# Patient Record
Sex: Female | Born: 1970 | Race: White | Hispanic: No | Marital: Married | Smoking: Former smoker
Health system: Southern US, Community
[De-identification: ages and names within clinical notes are randomized; demographics above are authoritative.]

## PROBLEM LIST (undated history)

## (undated) DIAGNOSIS — F419 Anxiety disorder, unspecified: Secondary | ICD-10-CM

## (undated) DIAGNOSIS — N809 Endometriosis, unspecified: Secondary | ICD-10-CM

## (undated) DIAGNOSIS — N200 Calculus of kidney: Secondary | ICD-10-CM

## (undated) DIAGNOSIS — G43909 Migraine, unspecified, not intractable, without status migrainosus: Secondary | ICD-10-CM

## (undated) DIAGNOSIS — Z87442 Personal history of urinary calculi: Secondary | ICD-10-CM

## (undated) DIAGNOSIS — F4310 Post-traumatic stress disorder, unspecified: Secondary | ICD-10-CM

## (undated) DIAGNOSIS — M797 Fibromyalgia: Secondary | ICD-10-CM

## (undated) DIAGNOSIS — F32A Depression, unspecified: Secondary | ICD-10-CM

## (undated) DIAGNOSIS — G8929 Other chronic pain: Secondary | ICD-10-CM

## (undated) DIAGNOSIS — R5382 Chronic fatigue, unspecified: Secondary | ICD-10-CM

## (undated) DIAGNOSIS — R55 Syncope and collapse: Secondary | ICD-10-CM

## (undated) DIAGNOSIS — K589 Irritable bowel syndrome without diarrhea: Secondary | ICD-10-CM

## (undated) HISTORY — PX: CYSTOSCOPY W/ URETEROSCOPY: SUR379

## (undated) HISTORY — PX: FOOT FRACTURE SURGERY: SHX645

---
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
## 1998-01-28 ENCOUNTER — Inpatient Hospital Stay (HOSPITAL_COMMUNITY): Admission: AD | Admit: 1998-01-28 | Discharge: 1998-01-28 | Payer: Self-pay | Admitting: Gynecology

## 1998-07-21 ENCOUNTER — Inpatient Hospital Stay (HOSPITAL_COMMUNITY): Admission: AD | Admit: 1998-07-21 | Discharge: 1998-07-21 | Payer: Self-pay | Admitting: Obstetrics and Gynecology

## 1998-08-11 ENCOUNTER — Inpatient Hospital Stay (HOSPITAL_COMMUNITY): Admission: AD | Admit: 1998-08-11 | Discharge: 1998-08-13 | Payer: Self-pay | Admitting: Obstetrics and Gynecology

## 1998-08-22 ENCOUNTER — Inpatient Hospital Stay (HOSPITAL_COMMUNITY): Admission: AD | Admit: 1998-08-22 | Discharge: 1998-08-25 | Payer: Self-pay | Admitting: Obstetrics and Gynecology

## 1998-10-25 ENCOUNTER — Inpatient Hospital Stay (HOSPITAL_COMMUNITY): Admission: AD | Admit: 1998-10-25 | Discharge: 1998-10-25 | Payer: Self-pay | Admitting: Obstetrics and Gynecology

## 1998-10-27 ENCOUNTER — Inpatient Hospital Stay (HOSPITAL_COMMUNITY): Admission: AD | Admit: 1998-10-27 | Discharge: 1998-10-28 | Payer: Self-pay | Admitting: Obstetrics and Gynecology

## 1998-10-29 ENCOUNTER — Inpatient Hospital Stay (HOSPITAL_COMMUNITY): Admission: AD | Admit: 1998-10-29 | Discharge: 1998-10-29 | Payer: Self-pay | Admitting: Obstetrics and Gynecology

## 1998-11-28 ENCOUNTER — Inpatient Hospital Stay (HOSPITAL_COMMUNITY): Admission: AD | Admit: 1998-11-28 | Discharge: 1998-11-28 | Payer: Self-pay | Admitting: Gynecology

## 1998-12-05 ENCOUNTER — Inpatient Hospital Stay (HOSPITAL_COMMUNITY): Admission: AD | Admit: 1998-12-05 | Discharge: 1998-12-05 | Payer: Self-pay | Admitting: Gynecology

## 1998-12-26 ENCOUNTER — Encounter: Admission: RE | Admit: 1998-12-26 | Discharge: 1999-03-26 | Payer: Self-pay | Admitting: Obstetrics and Gynecology

## 1999-01-05 ENCOUNTER — Inpatient Hospital Stay (HOSPITAL_COMMUNITY): Admission: AD | Admit: 1999-01-05 | Discharge: 1999-01-05 | Payer: Self-pay | Admitting: Obstetrics and Gynecology

## 1999-01-05 ENCOUNTER — Encounter: Payer: Self-pay | Admitting: Obstetrics and Gynecology

## 1999-01-10 ENCOUNTER — Inpatient Hospital Stay (HOSPITAL_COMMUNITY): Admission: AD | Admit: 1999-01-10 | Discharge: 1999-01-10 | Payer: Self-pay | Admitting: Obstetrics and Gynecology

## 1999-02-12 ENCOUNTER — Inpatient Hospital Stay (HOSPITAL_COMMUNITY): Admission: AD | Admit: 1999-02-12 | Discharge: 1999-02-12 | Payer: Self-pay | Admitting: Obstetrics and Gynecology

## 1999-02-14 ENCOUNTER — Observation Stay (HOSPITAL_COMMUNITY): Admission: AD | Admit: 1999-02-14 | Discharge: 1999-02-15 | Payer: Self-pay | Admitting: Obstetrics and Gynecology

## 1999-02-23 ENCOUNTER — Inpatient Hospital Stay (HOSPITAL_COMMUNITY): Admission: AD | Admit: 1999-02-23 | Discharge: 1999-02-26 | Payer: Self-pay | Admitting: Gynecology

## 1999-03-26 ENCOUNTER — Emergency Department (HOSPITAL_COMMUNITY): Admission: EM | Admit: 1999-03-26 | Discharge: 1999-03-27 | Payer: Self-pay

## 1999-03-27 ENCOUNTER — Emergency Department (HOSPITAL_COMMUNITY): Admission: EM | Admit: 1999-03-27 | Discharge: 1999-03-28 | Payer: Self-pay

## 1999-05-10 ENCOUNTER — Encounter: Payer: Self-pay | Admitting: Emergency Medicine

## 1999-05-10 ENCOUNTER — Emergency Department (HOSPITAL_COMMUNITY): Admission: EM | Admit: 1999-05-10 | Discharge: 1999-05-10 | Payer: Self-pay | Admitting: Emergency Medicine

## 1999-05-13 ENCOUNTER — Encounter: Payer: Self-pay | Admitting: Gastroenterology

## 1999-05-13 ENCOUNTER — Emergency Department (HOSPITAL_COMMUNITY): Admission: EM | Admit: 1999-05-13 | Discharge: 1999-05-13 | Payer: Self-pay | Admitting: Emergency Medicine

## 1999-07-22 ENCOUNTER — Encounter: Payer: Self-pay | Admitting: Gastroenterology

## 1999-07-22 ENCOUNTER — Ambulatory Visit (HOSPITAL_COMMUNITY): Admission: RE | Admit: 1999-07-22 | Discharge: 1999-07-22 | Payer: Self-pay | Admitting: Gastroenterology

## 1999-07-23 ENCOUNTER — Inpatient Hospital Stay (HOSPITAL_COMMUNITY): Admission: AD | Admit: 1999-07-23 | Discharge: 1999-07-25 | Payer: Self-pay | Admitting: General Surgery

## 1999-07-24 ENCOUNTER — Encounter: Payer: Self-pay | Admitting: General Surgery

## 1999-07-26 ENCOUNTER — Inpatient Hospital Stay (HOSPITAL_COMMUNITY): Admission: EM | Admit: 1999-07-26 | Discharge: 1999-07-28 | Payer: Self-pay | Admitting: Unknown Physician Specialty

## 1999-07-26 ENCOUNTER — Encounter: Payer: Self-pay | Admitting: Emergency Medicine

## 1999-07-26 ENCOUNTER — Encounter: Payer: Self-pay | Admitting: General Surgery

## 1999-08-06 ENCOUNTER — Encounter: Payer: Self-pay | Admitting: General Surgery

## 1999-08-06 ENCOUNTER — Ambulatory Visit (HOSPITAL_COMMUNITY): Admission: RE | Admit: 1999-08-06 | Discharge: 1999-08-06 | Payer: Self-pay | Admitting: General Surgery

## 1999-08-13 ENCOUNTER — Ambulatory Visit (HOSPITAL_COMMUNITY): Admission: RE | Admit: 1999-08-13 | Discharge: 1999-08-13 | Payer: Self-pay | Admitting: General Surgery

## 1999-08-13 ENCOUNTER — Encounter: Payer: Self-pay | Admitting: General Surgery

## 1999-09-07 ENCOUNTER — Encounter: Payer: Self-pay | Admitting: General Surgery

## 1999-09-07 ENCOUNTER — Inpatient Hospital Stay (HOSPITAL_COMMUNITY): Admission: EM | Admit: 1999-09-07 | Discharge: 1999-09-10 | Payer: Self-pay | Admitting: Emergency Medicine

## 1999-09-08 ENCOUNTER — Encounter: Payer: Self-pay | Admitting: General Surgery

## 1999-11-30 HISTORY — PX: CHOLECYSTECTOMY: SHX55

## 2000-03-25 ENCOUNTER — Emergency Department (HOSPITAL_COMMUNITY): Admission: EM | Admit: 2000-03-25 | Discharge: 2000-03-25 | Payer: Self-pay | Admitting: Emergency Medicine

## 2000-04-15 ENCOUNTER — Encounter: Payer: Self-pay | Admitting: Emergency Medicine

## 2000-04-15 ENCOUNTER — Emergency Department (HOSPITAL_COMMUNITY): Admission: EM | Admit: 2000-04-15 | Discharge: 2000-04-15 | Payer: Self-pay | Admitting: Emergency Medicine

## 2000-05-03 ENCOUNTER — Other Ambulatory Visit: Admission: RE | Admit: 2000-05-03 | Discharge: 2000-05-03 | Payer: Self-pay | Admitting: *Deleted

## 2000-11-08 ENCOUNTER — Emergency Department (HOSPITAL_COMMUNITY): Admission: EM | Admit: 2000-11-08 | Discharge: 2000-11-08 | Payer: Self-pay | Admitting: Emergency Medicine

## 2000-11-08 ENCOUNTER — Encounter: Payer: Self-pay | Admitting: Emergency Medicine

## 2001-05-22 ENCOUNTER — Encounter: Payer: Self-pay | Admitting: Emergency Medicine

## 2001-05-22 ENCOUNTER — Emergency Department (HOSPITAL_COMMUNITY): Admission: EM | Admit: 2001-05-22 | Discharge: 2001-05-22 | Payer: Self-pay | Admitting: Emergency Medicine

## 2001-06-28 ENCOUNTER — Other Ambulatory Visit: Admission: RE | Admit: 2001-06-28 | Discharge: 2001-06-28 | Payer: Self-pay | Admitting: Obstetrics and Gynecology

## 2007-10-14 ENCOUNTER — Emergency Department (HOSPITAL_COMMUNITY): Admission: EM | Admit: 2007-10-14 | Discharge: 2007-10-14 | Payer: Self-pay | Admitting: Emergency Medicine

## 2007-10-15 ENCOUNTER — Emergency Department (HOSPITAL_COMMUNITY): Admission: EM | Admit: 2007-10-15 | Discharge: 2007-10-15 | Payer: Self-pay | Admitting: Emergency Medicine

## 2007-10-16 ENCOUNTER — Ambulatory Visit (HOSPITAL_BASED_OUTPATIENT_CLINIC_OR_DEPARTMENT_OTHER): Admission: RE | Admit: 2007-10-16 | Discharge: 2007-10-17 | Payer: Self-pay | Admitting: Urology

## 2007-10-19 ENCOUNTER — Ambulatory Visit (HOSPITAL_BASED_OUTPATIENT_CLINIC_OR_DEPARTMENT_OTHER): Admission: RE | Admit: 2007-10-19 | Discharge: 2007-10-19 | Payer: Self-pay | Admitting: Urology

## 2009-12-04 ENCOUNTER — Emergency Department (HOSPITAL_COMMUNITY): Admission: EM | Admit: 2009-12-04 | Discharge: 2009-12-04 | Payer: Self-pay | Admitting: Family Medicine

## 2011-04-13 NOTE — Op Note (Signed)
Jordan Zimmerman, Jordan Zimmerman                  ACCOUNT NO.:  [id number]   MEDICAL RECORD NO.:  [id number]          PATIENT TYPE:  AMB   LOCATION:  [facility]                         FACILITY:  [facility]   PHYSICIAN:  Maretta Bees. Vonita Moss, M.D.DATE OF BIRTH:  11-02-1971   DATE OF PROCEDURE:  10/19/2007  DATE OF DISCHARGE:                               OPERATIVE REPORT   PREOPERATIVE DIAGNOSIS:  Left ureteral calculus and suspected  pyelonephritis.   POSTOPERATIVE DIAGNOSIS:  Left ureteral calculus and suspected  pyelonephritis.   PROCEDURE:  Cystoscopy, left ureteroscopy and stone basketing.   SURGEON:  Dr. Larey Dresser.   ANESTHESIA:  General.   INDICATIONS:  This 40 year old lady presented to me on October 16, 2007  with a 2-day history of left flank pain and no previous history of  stones and she had a 5 x 5 mm stone at the left UPJ and she had a fever.  She also had a history of fever.  She is brought to the operating room  earlier this week and specifically on that same day to put up a left  double-J catheter.  She is continuing to have some pain which I think is  stent related and she has had some fevers although urine culture turned  out to be growing multiple species.  She does get fever very easily and  has underlying fibromyalgia.  She is brought to the OR today for stone  removal in the hope that we could also remove her stent and get her more  comfortable.   PROCEDURE:  The patient brought to the operating room, placed lithotomy  position.  External genitalia were prepped and draped in usual fashion.  She was cystoscoped and the double-J catheters was noted coil in the  bladder.  I then used the grasper and pulled the double-J catheter just  outside the urethral meatus and threaded up the retractable cord  guidewire through the double-J catheter.  The stone was visualized in  the distal ureter.  I then used the inner core of the ureteral access  dilating sheath to dilate the intramural  ureter for two minutes.  With  the guidewire in place I then inserted a 6-French rigid ureteroscope and  easily visualized the stone.  I used a nitinol stone basket to remove  the stone intact.  The stone was given the patient's husband after the  case was over.  I then reinserted the ureteroscope and saw no evidence  of any significant ureteral injury whatsoever.  Therefore since she has  been on antibiotics several days and has good coverage, I opted to  remove her guidewire and leave out a double-J.  Bladder was emptied and  she was sent to recovery room in good condition.      Maretta Bees. Vonita Moss, M.D.  Electronically Signed     LJP/MEDQ  D:  10/19/2007  T:  10/19/2007  Job:  [id number]

## 2011-04-13 NOTE — Op Note (Signed)
[name], [name]                  ACCOUNT NO.:  [id number]   MEDICAL RECORD NO.:  [id number]          PATIENT TYPE:  AMB   LOCATION:  [facility]                         [facility]ACILITY:  [facility]   PHYSICIAN:  Maretta Bees. Vonita Moss, M.D.DATE OF BIRTH:  [birth year]   DATE OF PROCEDURE:  10/16/2007  DATE OF DISCHARGE:                               OPERATIVE REPORT   PREOPERATIVE DIAGNOSES:  1. Left ureteropelvic junction stone.  2. Urinary tract infection, with fever and possible pyelonephritis.   POSTOPERATIVE DIAGNOSES:  1. Distal left ureteral stone.  2. Urinary tract infection and fever, and possible left      pyelonephritis.   PROCEDURE:  Cystoscopy, left retrograde pyelogram with interpretation,  and left retrograde double-J catheter placement.   SURGEON:  Maretta Bees. Vonita Moss, M.D.   ANESTHESIA:  General.   INDICATIONS:  This lady has had a 2-day history of severe left flank  pain with nausea and vomiting, went to the ER, and was found to have a  left UPJ stone, and also was given a dose of Cipro because her urine  looked infected.  I saw her in the office today, and she is starting to  get febrile and has persistent symptoms of left flank pain, nausea and  vomiting, and felt best we go ahead with a cystoscopy and double-J  catheter placement.  She was told about the risks of bleeding,  obstruction, bladder irritability, etc.  She was given gentamicin and  Cipro intravenously preop, and urine cultures are pending.   PROCEDURE:  The patient was brought to the operating room and placed in  the lithotomy position.  External genitalia were prepped and draped in  the usual fashion.  She was cystoscoped, and the bladder was  unremarkable.  A retractable core guidewire was placed up the left  ureter, and it bypassed a stone in the mid-bony pelvis.  There was  consistent with the size stone seen on CT, measuring 8 x 5 x 5.   A left retrograde pyelogram was obtained by injecting contrast through  an  open-ended ureteral catheter placed cystoscopically over a guidewire.  She had mild pyelocaliectasis.  No filling defects were seen.   Guidewire was reinserted into the left kidney, and a 6-French 26 cm  double-J catheter was placed with a full coil in the upper left calix  and a full coil in the bladder.  The string was removed.  At this point,  the bladder was emptied, the cystoscope removed, and a B & O suppository  inserted per rectum, and she was taken to the recovery room in good  condition.      Maretta Bees. Vonita Moss, M.D.  Electronically Signed     LJP/MEDQ  D:  10/16/2007  T:  10/17/2007  Job:  [id number]

## 2011-09-07 LAB — URINALYSIS, ROUTINE W REFLEX MICROSCOPIC
Bilirubin Urine: NEGATIVE
Bilirubin Urine: NEGATIVE
Glucose, UA: 500 — AB
Glucose, UA: NEGATIVE
Ketones, ur: NEGATIVE
Leukocytes, UA: NEGATIVE
Nitrite: NEGATIVE
Nitrite: NEGATIVE
Protein, ur: 100 — AB
Protein, ur: NEGATIVE
Specific Gravity, Urine: 1.013
Specific Gravity, Urine: 1.027
Urobilinogen, UA: 0.2
Urobilinogen, UA: 0.2
pH: 5
pH: 7

## 2011-09-07 LAB — URINE MICROSCOPIC-ADD ON

## 2011-09-07 LAB — URINE CULTURE: Colony Count: >=100000

## 2011-09-07 LAB — POCT PREGNANCY, URINE
Operator id: 133231
Preg Test, Ur: NEGATIVE

## 2011-09-07 LAB — PREGNANCY, URINE: Preg Test, Ur: NEGATIVE

## 2011-09-07 LAB — BASIC METABOLIC PANEL
BUN: 12
CO2: 24
Calcium: 9.9
Chloride: 109
Creatinine, Ser: 0.81
GFR calc Af Amer: >60
GFR calc non Af Amer: >60
Glucose, Bld: 104 — ABNORMAL HIGH
Potassium: 3.8
Sodium: 142

## 2011-09-07 LAB — POCT HEMOGLOBIN-HEMACUE
Hemoglobin: 14
Operator id: 133231

## 2012-09-27 ENCOUNTER — Other Ambulatory Visit: Payer: Self-pay | Admitting: Obstetrics

## 2012-09-27 DIAGNOSIS — N644 Mastodynia: Secondary | ICD-10-CM

## 2012-10-04 ENCOUNTER — Other Ambulatory Visit: Payer: Self-pay | Admitting: Obstetrics

## 2012-10-04 ENCOUNTER — Ambulatory Visit
Admission: RE | Admit: 2012-10-04 | Discharge: 2012-10-04 | Disposition: A | Discharge status: Home / Self Care | Payer: BC Managed Care – PPO | Source: Ambulatory Visit | Attending: Obstetrics | Admitting: Obstetrics

## 2012-10-04 DIAGNOSIS — N644 Mastodynia: Secondary | ICD-10-CM

## 2013-04-04 ENCOUNTER — Emergency Department (HOSPITAL_COMMUNITY)
Admission: EM | Admit: 2013-04-04 | Discharge: 2013-04-04 | Disposition: A | Discharge status: Home / Self Care | Payer: Managed Care, Other (non HMO) | Attending: Emergency Medicine | Admitting: Emergency Medicine

## 2013-04-04 ENCOUNTER — Encounter (HOSPITAL_COMMUNITY): Payer: Self-pay | Admitting: Emergency Medicine

## 2013-04-04 DIAGNOSIS — Z8719 Personal history of other diseases of the digestive system: Secondary | ICD-10-CM | POA: Insufficient documentation

## 2013-04-04 DIAGNOSIS — R509 Fever, unspecified: Secondary | ICD-10-CM | POA: Insufficient documentation

## 2013-04-04 DIAGNOSIS — IMO0001 Reserved for inherently not codable concepts without codable children: Secondary | ICD-10-CM | POA: Insufficient documentation

## 2013-04-04 DIAGNOSIS — R112 Nausea with vomiting, unspecified: Secondary | ICD-10-CM

## 2013-04-04 DIAGNOSIS — Z87891 Personal history of nicotine dependence: Secondary | ICD-10-CM | POA: Insufficient documentation

## 2013-04-04 DIAGNOSIS — R5382 Chronic fatigue, unspecified: Secondary | ICD-10-CM | POA: Insufficient documentation

## 2013-04-04 DIAGNOSIS — G9332 Myalgic encephalomyelitis/chronic fatigue syndrome: Secondary | ICD-10-CM | POA: Insufficient documentation

## 2013-04-04 DIAGNOSIS — Z3202 Encounter for pregnancy test, result negative: Secondary | ICD-10-CM | POA: Insufficient documentation

## 2013-04-04 DIAGNOSIS — R197 Diarrhea, unspecified: Secondary | ICD-10-CM | POA: Insufficient documentation

## 2013-04-04 HISTORY — DX: Irritable bowel syndrome, unspecified: K58.9

## 2013-04-04 HISTORY — DX: Chronic fatigue, unspecified: R53.82

## 2013-04-04 HISTORY — DX: Fibromyalgia: M79.7

## 2013-04-04 LAB — URINALYSIS, ROUTINE W REFLEX MICROSCOPIC
Glucose, UA: NEGATIVE mg/dL
Ketones, ur: 40 mg/dL — AB
Leukocytes, UA: NEGATIVE
Nitrite: NEGATIVE
Protein, ur: 30 mg/dL — AB
Specific Gravity, Urine: 1.025 (ref 1.005–1.030)
Urobilinogen, UA: 0.2 mg/dL (ref 0.0–1.0)
pH: 5.5 (ref 5.0–8.0)

## 2013-04-04 LAB — COMPREHENSIVE METABOLIC PANEL
ALT: 18 U/L (ref 0–35)
AST: 34 U/L (ref 0–37)
Albumin: 4.2 g/dL (ref 3.5–5.2)
Alkaline Phosphatase: 94 U/L (ref 39–117)
BUN: 11 mg/dL (ref 6–23)
CO2: 24 mEq/L (ref 19–32)
Calcium: 10 mg/dL (ref 8.4–10.5)
Chloride: 102 mEq/L (ref 96–112)
Creatinine, Ser: 0.67 mg/dL (ref 0.50–1.10)
GFR calc Af Amer: >90 mL/min (ref >90)
GFR calc non Af Amer: >90 mL/min (ref >90)
Glucose, Bld: 117 mg/dL — ABNORMAL HIGH (ref 70–99)
Potassium: 3.2 mEq/L — ABNORMAL LOW (ref 3.5–5.1)
Sodium: 140 mEq/L (ref 135–145)
Total Bilirubin: 0.4 mg/dL (ref 0.3–1.2)
Total Protein: 7.8 g/dL (ref 6.0–8.3)

## 2013-04-04 LAB — CBC WITH DIFFERENTIAL/PLATELET
Basophils Absolute: 0 10*3/uL (ref 0.0–0.1)
Basophils Relative: 0 % (ref 0–1)
Eosinophils Absolute: 0 10*3/uL (ref 0.0–0.7)
Eosinophils Relative: 0 % (ref 0–5)
HCT: 40.4 % (ref 36.0–46.0)
Hemoglobin: 14.3 g/dL (ref 12.0–15.0)
Lymphocytes Relative: 25 % (ref 12–46)
Lymphs Abs: 1 10*3/uL (ref 0.7–4.0)
MCH: 31 pg (ref 26.0–34.0)
MCHC: 35.4 g/dL (ref 30.0–36.0)
MCV: 87.6 fL (ref 78.0–100.0)
Monocytes Absolute: 0.5 10*3/uL (ref 0.1–1.0)
Monocytes Relative: 12 % (ref 3–12)
Neutro Abs: 2.6 10*3/uL (ref 1.7–7.7)
Neutrophils Relative %: 63 % (ref 43–77)
Platelets: 188 10*3/uL (ref 150–400)
RBC: 4.61 MIL/uL (ref 3.87–5.11)
RDW: 12.6 % (ref 11.5–15.5)
WBC: 4.2 10*3/uL (ref 4.0–10.5)

## 2013-04-04 LAB — URINE MICROSCOPIC-ADD ON

## 2013-04-04 LAB — CK: Total CK: 62 U/L (ref 7–177)

## 2013-04-04 LAB — POCT PREGNANCY, URINE: Preg Test, Ur: NEGATIVE

## 2013-04-04 LAB — LIPASE, BLOOD: Lipase: 29 U/L (ref 11–59)

## 2013-04-04 MED ORDER — PROMETHAZINE HCL 25 MG/ML IJ SOLN
12.5000 mg | Freq: Once | INTRAMUSCULAR | Status: AC
  Administered 2013-04-04: 12.5 mg via INTRAVENOUS
  Filled 2013-04-04: qty 1

## 2013-04-04 MED ORDER — POTASSIUM CHLORIDE 10 MEQ/100ML IV SOLN
10.0000 meq | Freq: Once | INTRAVENOUS | Status: AC
  Administered 2013-04-04: 10 meq via INTRAVENOUS
  Filled 2013-04-04: qty 100

## 2013-04-04 MED ORDER — SODIUM CHLORIDE 0.9 % IV BOLUS (SEPSIS)
2000.0000 mL | Freq: Once | INTRAVENOUS | Status: AC
  Administered 2013-04-04: 2000 mL via INTRAVENOUS

## 2013-04-04 MED ORDER — PROMETHAZINE HCL 25 MG PO TABS: 25.0000 mg | ORAL_TABLET | Freq: Four times a day (QID) | ORAL | Status: DC | PRN

## 2013-04-04 MED ORDER — ONDANSETRON HCL 4 MG/2ML IJ SOLN
4.0000 mg | Freq: Once | INTRAMUSCULAR | Status: AC
  Administered 2013-04-04: 4 mg via INTRAVENOUS
  Filled 2013-04-04: qty 2

## 2013-04-04 NOTE — ED Notes (Signed)
Pt reports persistent  NVD and fever of 101 oral, x 4 days. Seen by PCP -Dr Tomi Bamberger. Given Zofran. Trated fever with Motrin. Referred to ED by PCP

## 2013-04-04 NOTE — ED Notes (Signed)
[employer] called to say that an IV pump was needed for patient.

## 2013-04-04 NOTE — ED Notes (Signed)
Patient had approx 50ml sprite and stated that it felt like it was sitting in her chest.

## 2013-04-04 NOTE — ED Provider Notes (Signed)
History     CSN: [id number]  Arrival date & time 04/04/13  1478   First MD Initiated Contact with Patient 04/04/13 1124      Chief Complaint  Patient presents with  . Nausea    2 day hx of NVD  . Emesis    Seen by PCP-given Zofran  . Fever  . Diarrhea    (Consider location/radiation/quality/duration/timing/severity/associated sxs/prior treatment) HPI Patient presents emergency department with nausea, vomiting, for the last 3 days.  Patient, states, that she has fibromyalgia and worked out in the yard excessively and this caused her problems with nausea, vomiting, she states this happens every time she over exerts herself.  Patient denies chest pain, abdominal pain, headache, blurred vision, dizziness, fever, syncope, back pain, dysuria, shortness of breath, weakness, or lethargy.  Patient, states, that she did not take any medications prior to arrival for her symptoms.  Patient, states she normally takes [place] powders for her fibromyalgia. Past Medical History  Diagnosis Date  . Fibromyalgia   . Chronic fatigue   . IBS (irritable bowel syndrome)     Past Surgical History  Procedure Laterality Date  . Cholecystectomy    . Tonsillectomy      Family History  Problem Relation Age of Onset  . Diabetes Mother   . Cancer Mother   . Hypertension Mother   . Hypertension Father   . Diabetes Father   . Cancer Father     History  Substance Use Topics  . Smoking status: Former Games developer  . Smokeless tobacco: Not on file  . Alcohol Use: Yes    OB History   Grav Para Term Preterm Abortions TAB SAB Ect Mult Living                  Review of Systems All other systems negative except as documented in the HPI. All pertinent positives and negatives as reviewed in the HPI. Allergies  Ambien; Amoxicillin; and Penicillins  Home Medications   Current Outpatient Rx  Name  Route  Sig  Dispense  Refill  . CRANBERRY PO   Oral   Take 2 capsules by mouth 2 (two) times daily.          Marland Kitchen ECHINACEA EXTRACT PO   Oral   Take 1 tablet by mouth 2 (two) times daily as needed (immune support.).         Marland Kitchen Ginkgo Biloba 40 MG TABS   Oral   Take 1 tablet by mouth 2 (two) times daily.         Marland Kitchen ibuprofen (ADVIL,MOTRIN) 200 MG tablet   Oral   Take 400 mg by mouth every 6 (six) hours as needed for pain.         Marland Kitchen ondansetron (ZOFRAN) 4 MG tablet   Oral   Take 4 mg by mouth every 8 (eight) hours as needed for nausea.         . ST JOHNS WORT PO   Oral   Take 1 capsule by mouth 3 (three) times daily.           BP 115/68  Pulse 85  Temp(Src) 98.4 F (36.9 C) (Oral)  Resp 16  Wt 173 lb 8 oz (78.699 kg)  SpO2 99%  LMP 03/21/2013  Physical Exam  Constitutional: She appears well-developed and well-nourished. No distress.  HENT:  Head: Normocephalic and atraumatic.  Mouth/Throat: Oropharynx is clear and moist.  Eyes: Pupils are equal, round, and reactive to light.  Neck: Normal  range of motion. Neck supple.  Cardiovascular: Normal rate, regular rhythm and normal heart sounds.  Exam reveals no gallop and no friction rub.   No murmur heard. Pulmonary/Chest: Effort normal and breath sounds normal.    ED Course  Procedures (including critical care time)  Labs Reviewed  COMPREHENSIVE METABOLIC PANEL - Abnormal; Notable for the following:    Potassium 3.2 (*)    Glucose, Bld 117 (*)    All other components within normal limits  CBC WITH DIFFERENTIAL  LIPASE, BLOOD  URINALYSIS, ROUTINE W REFLEX MICROSCOPIC  POCT PREGNANCY, URINE   patient states she was working outside in the heat and she states she exacerbated her fibromyalgia.  Patient, states, that she did not take any medications prior to arrival.  Patient has been given IV fluids and is feeling better here in the emergency department following an antiemetic.  Patient's vital signs have remained stable as well   MDM  MDM Reviewed: vitals, nursing note and previous chart Interpretation:  labs            Carlyle Dolly, PA-C 04/06/13 [phone]

## 2013-04-06 NOTE — ED Provider Notes (Signed)
Medical screening examination/treatment/procedure(s) were performed by non-physician practitioner and as supervising physician I was immediately available for consultation/collaboration.  Takota Cahalan R. Simpson Paulos, MD 04/06/13 0951 

## 2013-05-21 ENCOUNTER — Inpatient Hospital Stay (HOSPITAL_COMMUNITY)
Admission: EM | Admit: 2013-05-21 | Discharge: 2013-05-23 | DRG: 392 | Disposition: A | Discharge status: Home / Self Care | Payer: Managed Care, Other (non HMO) | Attending: Family Medicine | Admitting: Family Medicine

## 2013-05-21 ENCOUNTER — Encounter (HOSPITAL_COMMUNITY): Payer: Self-pay

## 2013-05-21 DIAGNOSIS — G9332 Myalgic encephalomyelitis/chronic fatigue syndrome: Secondary | ICD-10-CM | POA: Diagnosis present

## 2013-05-21 DIAGNOSIS — R5382 Chronic fatigue, unspecified: Secondary | ICD-10-CM | POA: Diagnosis present

## 2013-05-21 DIAGNOSIS — Z9089 Acquired absence of other organs: Secondary | ICD-10-CM

## 2013-05-21 DIAGNOSIS — M797 Fibromyalgia: Secondary | ICD-10-CM

## 2013-05-21 DIAGNOSIS — D72829 Elevated white blood cell count, unspecified: Secondary | ICD-10-CM

## 2013-05-21 DIAGNOSIS — IMO0001 Reserved for inherently not codable concepts without codable children: Secondary | ICD-10-CM | POA: Diagnosis present

## 2013-05-21 DIAGNOSIS — Z87891 Personal history of nicotine dependence: Secondary | ICD-10-CM

## 2013-05-21 DIAGNOSIS — R112 Nausea with vomiting, unspecified: Secondary | ICD-10-CM

## 2013-05-21 DIAGNOSIS — R1115 Cyclical vomiting syndrome unrelated to migraine: Principal | ICD-10-CM | POA: Diagnosis present

## 2013-05-21 DIAGNOSIS — E876 Hypokalemia: Secondary | ICD-10-CM | POA: Diagnosis present

## 2013-05-21 DIAGNOSIS — R197 Diarrhea, unspecified: Secondary | ICD-10-CM

## 2013-05-21 DIAGNOSIS — K589 Irritable bowel syndrome without diarrhea: Secondary | ICD-10-CM

## 2013-05-21 DIAGNOSIS — R319 Hematuria, unspecified: Secondary | ICD-10-CM | POA: Diagnosis present

## 2013-05-21 DIAGNOSIS — E860 Dehydration: Secondary | ICD-10-CM | POA: Diagnosis present

## 2013-05-21 HISTORY — DX: Calculus of kidney: N20.0

## 2013-05-21 HISTORY — DX: Migraine, unspecified, not intractable, without status migrainosus: G43.909

## 2013-05-21 LAB — CBC WITH DIFFERENTIAL/PLATELET
Basophils Absolute: 0 10*3/uL (ref 0.0–0.1)
Basophils Relative: 0 % (ref 0–1)
Eosinophils Absolute: 0 10*3/uL (ref 0.0–0.7)
Eosinophils Relative: 0 % (ref 0–5)
HCT: 41.8 % (ref 36.0–46.0)
Hemoglobin: 14.6 g/dL (ref 12.0–15.0)
Lymphocytes Relative: 10 % — ABNORMAL LOW (ref 12–46)
Lymphs Abs: 1.5 10*3/uL (ref 0.7–4.0)
MCH: 31.2 pg (ref 26.0–34.0)
MCHC: 34.9 g/dL (ref 30.0–36.0)
MCV: 89.3 fL (ref 78.0–100.0)
Monocytes Absolute: 0.8 10*3/uL (ref 0.1–1.0)
Monocytes Relative: 5 % (ref 3–12)
Neutro Abs: 13.7 10*3/uL — ABNORMAL HIGH (ref 1.7–7.7)
Neutrophils Relative %: 85 % — ABNORMAL HIGH (ref 43–77)
Platelets: 360 10*3/uL (ref 150–400)
RBC: 4.68 MIL/uL (ref 3.87–5.11)
RDW: 13.3 % (ref 11.5–15.5)
WBC: 16.1 10*3/uL — ABNORMAL HIGH (ref 4.0–10.5)

## 2013-05-21 LAB — RAPID URINE DRUG SCREEN, HOSP PERFORMED
Amphetamines: NOT DETECTED
Barbiturates: NOT DETECTED
Benzodiazepines: NOT DETECTED
Cocaine: NOT DETECTED
Opiates: POSITIVE — AB
Tetrahydrocannabinol: POSITIVE — AB

## 2013-05-21 LAB — COMPREHENSIVE METABOLIC PANEL
ALT: 8 U/L (ref 0–35)
AST: 21 U/L (ref 0–37)
Albumin: 4.7 g/dL (ref 3.5–5.2)
Alkaline Phosphatase: 92 U/L (ref 39–117)
BUN: 17 mg/dL (ref 6–23)
CO2: 22 mEq/L (ref 19–32)
Calcium: 9.8 mg/dL (ref 8.4–10.5)
Chloride: 105 mEq/L (ref 96–112)
Creatinine, Ser: 0.73 mg/dL (ref 0.50–1.10)
GFR calc Af Amer: >90 mL/min (ref >90)
GFR calc non Af Amer: >90 mL/min (ref >90)
Glucose, Bld: 150 mg/dL — ABNORMAL HIGH (ref 70–99)
Potassium: 3.3 mEq/L — ABNORMAL LOW (ref 3.5–5.1)
Sodium: 142 mEq/L (ref 135–145)
Total Bilirubin: 0.7 mg/dL (ref 0.3–1.2)
Total Protein: 7.9 g/dL (ref 6.0–8.3)

## 2013-05-21 LAB — URINALYSIS, ROUTINE W REFLEX MICROSCOPIC
Glucose, UA: NEGATIVE mg/dL
Ketones, ur: 15 mg/dL — AB
Leukocytes, UA: NEGATIVE
Nitrite: NEGATIVE
Protein, ur: 30 mg/dL — AB
Specific Gravity, Urine: 1.027 (ref 1.005–1.030)
Urobilinogen, UA: 0.2 mg/dL (ref 0.0–1.0)
pH: 5.5 (ref 5.0–8.0)

## 2013-05-21 LAB — PREGNANCY, URINE: Preg Test, Ur: NEGATIVE

## 2013-05-21 LAB — URINE MICROSCOPIC-ADD ON

## 2013-05-21 LAB — LIPASE, BLOOD: Lipase: 23 U/L (ref 11–59)

## 2013-05-21 MED ORDER — SODIUM CHLORIDE 0.9 % IV SOLN
INTRAVENOUS | Status: DC
  Administered 2013-05-21: 10:00:00 via INTRAVENOUS

## 2013-05-21 MED ORDER — ONDANSETRON HCL 4 MG/2ML IJ SOLN
4.0000 mg | Freq: Once | INTRAMUSCULAR | Status: AC
  Administered 2013-05-21: 4 mg via INTRAVENOUS

## 2013-05-21 MED ORDER — MORPHINE SULFATE 4 MG/ML IJ SOLN
4.0000 mg | Freq: Once | INTRAMUSCULAR | Status: AC
  Administered 2013-05-21: 4 mg via INTRAVENOUS
  Filled 2013-05-21: qty 1

## 2013-05-21 MED ORDER — ACETAMINOPHEN 325 MG PO TABS
650.0000 mg | ORAL_TABLET | Freq: Four times a day (QID) | ORAL | Status: DC | PRN
  Administered 2013-05-21 – 2013-05-22 (×3): 650 mg via ORAL
  Filled 2013-05-21 (×3): qty 2

## 2013-05-21 MED ORDER — SODIUM CHLORIDE 0.9 % IV SOLN
INTRAVENOUS | Status: DC
  Administered 2013-05-21: 12:00:00 via INTRAVENOUS

## 2013-05-21 MED ORDER — SODIUM CHLORIDE 0.9 % IV BOLUS (SEPSIS)
1000.0000 mL | Freq: Once | INTRAVENOUS | Status: AC
Start: 2013-05-21 — End: 2013-05-21
  Administered 2013-05-21: 1000 mL via INTRAVENOUS

## 2013-05-21 MED ORDER — ONDANSETRON HCL 4 MG/2ML IJ SOLN
INTRAMUSCULAR | Status: AC
  Filled 2013-05-21: qty 2

## 2013-05-21 MED ORDER — SODIUM CHLORIDE 0.9 % IV SOLN
INTRAVENOUS | Status: DC
  Administered 2013-05-21 – 2013-05-22 (×3): via INTRAVENOUS

## 2013-05-21 MED ORDER — ONDANSETRON HCL 4 MG/2ML IJ SOLN
4.0000 mg | Freq: Three times a day (TID) | INTRAMUSCULAR | Status: AC | PRN
  Administered 2013-05-21: 4 mg via INTRAVENOUS
  Filled 2013-05-21: qty 2

## 2013-05-21 MED ORDER — PROMETHAZINE HCL 25 MG/ML IJ SOLN
12.5000 mg | INTRAMUSCULAR | Status: AC
Start: 2013-05-21 — End: 2013-05-21
  Administered 2013-05-21: 12.5 mg via INTRAVENOUS
  Filled 2013-05-21: qty 1

## 2013-05-21 MED ORDER — METOCLOPRAMIDE HCL 5 MG/ML IJ SOLN
10.0000 mg | Freq: Once | INTRAMUSCULAR | Status: AC
  Administered 2013-05-21: 10 mg via INTRAVENOUS
  Filled 2013-05-21: qty 2

## 2013-05-21 MED ORDER — HYDROMORPHONE HCL PF 1 MG/ML IJ SOLN: 1.0000 mg | INTRAMUSCULAR | Status: DC | PRN

## 2013-05-21 MED ORDER — HYDROMORPHONE HCL PF 1 MG/ML IJ SOLN
1.0000 mg | Freq: Once | INTRAMUSCULAR | Status: AC
  Administered 2013-05-21: 1 mg via INTRAVENOUS
  Filled 2013-05-21: qty 1

## 2013-05-21 MED ORDER — POTASSIUM CHLORIDE CRYS ER 20 MEQ PO TBCR
20.0000 meq | EXTENDED_RELEASE_TABLET | Freq: Once | ORAL | Status: AC
  Administered 2013-05-21: 20 meq via ORAL
  Filled 2013-05-21: qty 1

## 2013-05-21 MED ORDER — ONDANSETRON HCL 4 MG/2ML IJ SOLN: 4.0000 mg | Freq: Once | INTRAMUSCULAR | Status: DC

## 2013-05-21 MED ORDER — PROMETHAZINE HCL 25 MG/ML IJ SOLN
12.5000 mg | INTRAMUSCULAR | Status: DC | PRN
  Administered 2013-05-21 – 2013-05-22 (×5): 25 mg via INTRAVENOUS
  Filled 2013-05-21 (×5): qty 1

## 2013-05-21 NOTE — ED Notes (Signed)
Pt. C/o N/V/D x2 days. Seen at [facility] for the same, no relief.

## 2013-05-21 NOTE — ED Notes (Signed)
Report called to 6N

## 2013-05-21 NOTE — H&P (Signed)
[facility] Attending Admission Note: Renold Don MD Personal pager:  [phone] FPTS Service Pager:  [phone]  I  have seen and examined this patient, reviewed their chart. I have discussed this patient with the resident. I agree with the resident's findings, assessment and care plan.  Additionally:  42 YO Female with hx/o IBS, fibromyalgia, and chronic fatigue who presents with 2 day history of increasing nausea and vomiting along with diarrhea.  States started Saturday night aroudn 10 PM.  Sharp epigastric pain at that time.  NB/NB vomitus.  Diarrhea started afterwards.  Difficulty holding down any fluids.  Came to ED this AM to be evaluated, unable to hold PO and therefore FPTS called for admission.  Did state that this AM had dark tarry stool.  Otherwise denies melena/hematochezia.  Exam: Gen:  Caucasian female, WDWN lying in bed, NAD HEENT:  Dry mucus membranes Heart:  RRR Abdomen:  TTP mild in epigastrum, other benign Ext:  No edema  Imp/Plan: 1. N/V/D - Had similar episode about 1 month ago which resolved with anti-emetics in ED.  - Continue anti-emetics and see if able to hold PO  2.  Proteinuria: - agree with repeat UA.  - No symptoms of UTI.  Culture pending.   Tobey Grim, MD 05/21/2013 9:43 PM

## 2013-05-21 NOTE — ED Notes (Signed)
Pt. C/o N/V/D x2 days.  States "I'm vomiting like every 15 minutes and now I feel like I have something caught in my throat. I think I might have ruptured something". Pt. Denies blood in vomit. Seen at [facility] for same.

## 2013-05-21 NOTE — H&P (Signed)
Family Medicine Teaching [facility] Admission History and Physical Service Pager: [phone]  Patient name: Jordan Zimmerman Medical record number: [id number] Date of birth: [birth year] Age: 42 y.o. Gender: female  Primary Care Provider: Tomi Bamberger, NP  Chief Complaint: Nausea and vomiting  Assessment and Plan: Jordan Zimmerman is a 42 y.o. year old female with PMH significant for IBS, fibromyalgia, and chronic fatigue syndrome presenting with a 2 day history of nausea and vomiting.  # Nausea/Vomiting: Patient reports non-bloody, non-bilious emesis every 15 to 30 minutes since Saturday PM. She has been unable to tolerate PO. She also reports constant throat pain that worsens and sharpens with swallowing as well as a 5-minute episode of severe sharp abdominal pain while in the ED that has since resolved; she has mild musculoskeletal soreness of the abdomen. She is feeling better after receiving IV Zofran. - Continue IVF until tolerating PO - Zofran 4mg  IV q8hr PRN  - Phenergan 12.5mg  IV q4hr PRN - Hydromorphone 1mg  q4hr PRN moderate to severe pain - Consider abdominal imaging if symptoms persist [ ]  F/u UDS [ ]  Order chloraseptic spray PRN throat pain  # Hematuria/Proteinuria: UA showed 15 ketones, 30 protein, large Hgb, but was negative for both leukocytes and nitrites. Patient says her LMP ended on 6/10 and denies any current bleeding. She does state that she is premenopausal and typically gets her period twice a month. She denies any change in her urinary baseline, including discoloration, dysuria, and frequency. Cr is normal at 0.73. - Consider repeat UA [ ]  F/u urine culture  # Leukocytosis: WBC elevated at 16.1 on admission with a neutrophil predominance of 85%. Patient has been afebrile and denies any fever in the past two weeks. She also denies any sick contacts. - Continue to monitor [ ]  Repeat CBC tomorrow AM  # Hypokalemia: K is 3.3 on admission. Likely secondary to PO  intolerance. Patient received K-DUR in the ED. - Continue to monitor; will consider additional repletion [ ]  Repeat BMET tomorrow AM  # IBS: Patient endorses some diarrhea, but states that she has this "on and off" at baseline. Her recent diarrhea has not significantly worsened or changed; there has been no blood.  - Continue to monitor  # Fibromyalgia: Stable, patient has no complaints. - Continue home regimen of herbal supplements  # Chronic fatigue syndrome: Stable, patient does not endorse increased fatigue.  FEN/GI: Clears, advance as tolerated; IVF NS at 145ml/hr Prophylaxis: SQ Heparin Disposition: Pending clinical improvement in tolerating PO, self-care at home Code Status: FULL, as discussed with patient on admission  History of Present Illness: Jordan Zimmerman is a 42 y.o. year old female  with PMH significant for IBS, fibromyalgia, and chronic fatigue syndrome presenting with a 2 day history of nausea and vomiting. Per the patient, she has mild nausea at baseline 2/2 her fibromyalgia. Her nausea worsened two nights ago and she began vomiting "every 15 to 30 minutes." She describes the emesis as "yellow or brownish," but denies any blood or bile. She tried chewing ginger to relieve her symptoms without success. Patient has been unable to tolerate PO intake. She now endorses some abdominal soreness as well as throat pain. She states that she "feels something" whenever she swallows and that swallowing increases the pain. She denies any fever, malaise, or sick contacts.  Of note, the patient reports a similar episode of prolonged nausea and vomiting in mid-May. She states that it was almost exactly the same, except that she reportedly  had a fever of 104-105F. She states that she was seen in an discharged from the ED at [facility] and then "went back and forth" to her family doctor for management. She says she tried oral anti-emetics as well as suppositories, but felt that neither helped  her symptoms. Her nausea and vomiting resolved in "about a week."  Over the course of the morning in the ED, she received fluids, Reglan x1, Phenergan x1, and Zofran x1. She also received morphine 4mg  x2 and dilaudid 1mg  x2 presumably for abdominal pain. Per the patient, she had a single episode of severe sharp pain in her epigastric region that made her abdomen "hard," but resolved in 5 minutes. Her symptoms had abated by the time she was interviewed for this H&P.  Patient Active Problem List   Diagnosis Date Noted  . Fibromyalgia 05/21/2013  . Irritable bowel syndrome 05/21/2013  . Dehydration 05/21/2013  . Nausea and vomiting 05/21/2013   Past Medical History: Past Medical History  Diagnosis Date  . Fibromyalgia   . Chronic fatigue   . IBS (irritable bowel syndrome)    Past Surgical History: Past Surgical History  Procedure Laterality Date  . Cholecystectomy    . Tonsillectomy     Social History: History  Substance Use Topics  . Smoking status: Former Games developer  . Smokeless tobacco: Not on file  . Alcohol Use: Yes  Patient lives at home with her husband and two children.  For any additional social history documentation, please refer to relevant sections of EMR.  Family History: Family History  Problem Relation Age of Onset  . Diabetes Mother   . Cancer Mother   . Hypertension Mother   . Hypertension Father   . Diabetes Father   . Cancer Father    Allergies: Allergies  Allergen Reactions  . Ambien (Zolpidem Tartrate) Anaphylaxis  . Amoxicillin Hives and Swelling    Fever.   . Penicillins Hives   No current facility-administered medications on file prior to encounter.   Current Outpatient Prescriptions on File Prior to Encounter  Medication Sig Dispense Refill  . CRANBERRY PO Take 2 capsules by mouth 2 (two) times daily.      Marland Kitchen ECHINACEA EXTRACT PO Take 1 tablet by mouth 2 (two) times daily as needed (immune support.).      Marland Kitchen Ginkgo Biloba 40 MG TABS Take 1  tablet by mouth 2 (two) times daily.      . ondansetron (ZOFRAN) 4 MG tablet Take 4 mg by mouth every 8 (eight) hours as needed for nausea.      . ST JOHNS WORT PO Take 1 capsule by mouth 3 (three) times daily.       Review Of Systems: Per HPI. Otherwise 12 point review of systems was performed and was unremarkable.  Physical Exam: BP 107/48  Pulse 61  Temp(Src) 99.2 F (37.3 C) (Oral)  Resp 16  Ht 5\' 5"  (1.651 m)  Wt 178 lb (80.74 kg)  BMI 29.62 kg/m2  SpO2 100% Exam: General: Laying comfortably in bed, no acute distress, mildly ill-appearing HEENT: PERRL, EOMI, mucous membranes are somewhat dry, unable to clearly visualize the back of the throat Cardiovascular: RRR, no murmur heard Respiratory: No increased work of breathing, CTAB Abdomen: Active bowel sounds, no organomegaly noted, mild discomfort with moderate palpation of upper epigastric area, otherwise no TTP Extremities: 2+ pulses bilaterally in UE and LE, no edema noted Skin: No rashes seen Neuro: Oriented to person, place, date, and situation  Labs  and Imaging: CBC BMET   Recent Labs Lab 05/21/13 0645  WBC 16.1*  HGB 14.6  HCT 41.8  PLT 360    Recent Labs Lab 05/21/13 0645  NA 142  K 3.3*  CL 105  CO2 22  BUN 17  CREATININE 0.73  GLUCOSE 150*  CALCIUM 9.8    6/23 - UA: Amber, turbid, 15 ketones, 30 protein, large Hgb, but was negative for both leukocytes and nitrites.    Mat Carne, MD 05/21/2013, 7:16 PM  Family Medicine Teaching Service Hospital Admission History and Physical  Patient name: Jordan Zimmerman Medical record number: [id number] Date of birth: [birth year] Age: 42 y.o. Gender: female  Primary Care Provider:  Tomi Bamberger, NP  Chief Complaint: Nausea and Vomiting  History of Present Illness: Jordan Zimmerman is a 42 y.o. year old female presenting with nausea and vomiting of two days duration. It started suddenly after eating chicken and rice on Saturday. This was not accompanied by  abdominal pain, fever, chills or diarrhea. She denies difficulty swallowing or pain with swallowing. Her son and husband at the same meal and have no symptoms. There are no known sick contacts. She attempted treatment with ondansetron prescribed by PCP, as well as ginger root, but neither was effective. No recent antibiotics or NSAIDS.   She notes a history of nausea, which has occurred daily since age 7. Usually, she is able to manage it by chewing ginger root. Occasionally, she will have difficulty eating for 1-2 days at a time, but she will be able to keep down liquids. She has not been able to tolerate liquids during this episode which is what brought her to the hospital. She had a similar case of nausea in May for which she went to [facility]. She does not know what caused that episode, but said it resolved after about 1 week. According to Epic the patient was evaluated for pancreatitis and pregnancy which were negative. No imaging was done. The patient is not sure if she has every seen a GI doctor. She denies any swallowing study, endoscopy, or CT scan for this problem in the past. She has a history of cholecystectomy in 2001 with need for percutaneous draining, but no abdominal procedures since that time.   Today in the ED, she was given normal saline, ondansetron, phenergan, morphine and dilaudid. Currently, she denies pain. She notes no symptoms at rest and has tolerated a few sips of liquids.     Patient Active Problem List   Diagnosis Date Noted  . Fibromyalgia 05/21/2013  . Irritable bowel syndrome 05/21/2013  . Dehydration 05/21/2013  . Nausea and vomiting 05/21/2013   Past Medical History: Past Medical History  Diagnosis Date  . Fibromyalgia   . Chronic fatigue   . IBS (irritable bowel syndrome)     Past Surgical History: Past Surgical History  Procedure Laterality Date  . Cholecystectomy    . Tonsillectomy      Social History: History   Social History  .  Marital Status: Married    Spouse Name: N/A    Number of Children: N/A  . Years of Education: N/A   Social History Main Topics  . Smoking status: Former Games developer  . Smokeless tobacco: None  . Alcohol Use: Yes  . Drug Use: None  . Sexually Active: None   Other Topics Concern  . None   Social History Narrative  . None    Family History: Family History  Problem Relation Age of Onset  .  Diabetes Mother   . Cancer Mother   . Hypertension Mother   . Hypertension Father   . Diabetes Father   . Cancer Father     Allergies: Allergies  Allergen Reactions  . Ambien (Zolpidem Tartrate) Anaphylaxis  . Amoxicillin Hives and Swelling    Fever.   . Penicillins Hives   No current facility-administered medications on file prior to encounter.   Current Outpatient Prescriptions on File Prior to Encounter  Medication Sig Dispense Refill  . CRANBERRY PO Take 2 capsules by mouth 2 (two) times daily.      Marland Kitchen ECHINACEA EXTRACT PO Take 1 tablet by mouth 2 (two) times daily as needed (immune support.).      Marland Kitchen Ginkgo Biloba 40 MG TABS Take 1 tablet by mouth 2 (two) times daily.      . ondansetron (ZOFRAN) 4 MG tablet Take 4 mg by mouth every 8 (eight) hours as needed for nausea.      . ST JOHNS WORT PO Take 1 capsule by mouth 3 (three) times daily.         Current Facility-Administered Medications  Medication Dose Route Frequency Provider Last Rate Last Dose  . 0.9 %  sodium chloride infusion   Intravenous Continuous Fayrene Helper, PA-C 125 mL/hr at 05/21/13 0921    . ondansetron (ZOFRAN) injection 4 mg  4 mg Intravenous Q8H PRN Fayrene Helper, PA-C   4 mg at 05/21/13 1819  . promethazine (PHENERGAN) injection 12.5-25 mg  12.5-25 mg Intravenous Q4H PRN Andrena Mews, DO   25 mg at 05/21/13 1513   Review Of Systems: Per HPI with the following additions: none Otherwise 12 point review of systems was performed and was unremarkable.  Physical Exam: Temp:  [98.1 F (36.7 C)-99.2 F (37.3 C)]  99.2 F (37.3 C) (06/23 1400) Pulse Rate:  [48-82] 61 (06/23 1400) Resp:  [14-18] 16 (06/23 1400) BP: (96-130)/(48-80) 107/48 mmHg (06/23 1400) SpO2:  [97 %-100 %] 100 % (06/23 1400) Weight:  [178 lb (80.74 kg)] 178 lb (80.74 kg) (06/23 1400)   General: alert, cooperative, appears stated age and no distress HEENT: PERRLA, extra ocular movement intact, sclera clear, anicteric and oropharynx clear, no lesions, oropharynx dry Heart: S1, S2 normal, no murmur, rub or gallop, regular rate and rhythm Lungs: clear to auscultation, no wheezes or rales and unlabored breathing Abdomen: abdomen is soft without significant tenderness, masses, organomegaly or guarding Extremities: extremities normal, atraumatic, no cyanosis or edema Skin:no rashes, no ecchymoses Neurology: normal without focal findings, mental status, speech normal, alert and oriented x3 and PERLA  Labs and Imaging:  See labs above No imaging     Assessment and Plan: Jordan Zimmerman is a 42 y.o. year old female presenting with nausea and vomiting and mild dehydration of unknown etiology.   # Nausea and Vomiting - Etiology is unclear. No evidence of acute abdomen on exam. History and picture not consistent with ileus or SBO. Not likely infectious since rest of her family is not sick. She has a history of similar symptoms and it's not clear what work-up has been undertaken. Since no dysphagia or odynophagia at this point, achalasia, stricture, or mass less likely. However, she may have some gastric dysmotility issue. No evidence of CNS problem on history or physical. Maybe cyclic vomiting syndrome.  - Cont alternating ondansetron and zofran - Consider reglan if not effective - Contact PCP office tomorrow to see about previous work up - No consultation or imaging warranted at this  point  # Dehydration - Due to nausea and vomiting. Less than 10% given stable vitals.  - Cont NS IV @ 100 ml/hr while awaiting improvement in PO tolerance    # Hypokalemia  - 3.3 upon arrival - F/u in AM  # Leukocytosis - 16.1 on admission, Unknown etiology as patient with no other objective signs of infection and no hx of sick ontact - Continue to trend  # Fibromyalgia - stable   FENGI - NS @ 100 ml/hr; liquid diet PPX - early ambulation DISPO - Admit to [facility] Teaching service given inability to tolerate PO; D/c pending tolerating PO  Si Raider. Clinton Sawyer, MD, MBA 05/21/2013, 7:16 PM Family Medicine Resident, PGY-2 [phone] pager

## 2013-05-21 NOTE — ED Notes (Signed)
Fayrene Helper, PA states pt can have ice chips and see if pt can tolerate PO fluids

## 2013-05-21 NOTE — ED Provider Notes (Signed)
History     CSN: [id number]  Arrival date & time 05/21/13  0606   First MD Initiated Contact with Patient 05/21/13 [phone]      Chief Complaint  Patient presents with  . Emesis    (Consider location/radiation/quality/duration/timing/severity/associated sxs/prior treatment) HPI  42 year old female with history of fibromyalgia, IBS, and chronic fatigue syndrome presents for evaluations of nausea vomiting and diarrhea. Patient reports for the past 2 days she has had gradual onset of persistent nausea, vomiting or than 20 bouts per day of non bloody, non bilious content.  She also endorsed to 3 bouts of nonbloody, non-mucous diarrhea per day. Eating or drinking worsen her symptoms. She is unable to keep any of her medication down. She denies fever, chills, headache, sneezing, cough, chest pain, shortness of breath, back pain, abdominal pain, dysuria, or rash. She denies dizziness or numbness. She relates her symptoms to her fibromyalgia which she has had for the past 10 years. She was last seen at [facility] for the same complaint 2 weeks ago. Patient does have a surgical history of cholecystectomy.  Past Medical History  Diagnosis Date  . Fibromyalgia   . Chronic fatigue   . IBS (irritable bowel syndrome)     Past Surgical History  Procedure Laterality Date  . Cholecystectomy    . Tonsillectomy      Family History  Problem Relation Age of Onset  . Diabetes Mother   . Cancer Mother   . Hypertension Mother   . Hypertension Father   . Diabetes Father   . Cancer Father     History  Substance Use Topics  . Smoking status: Former Games developer  . Smokeless tobacco: Not on file  . Alcohol Use: Yes    OB History   Grav Para Term Preterm Abortions TAB SAB Ect Mult Living                  Review of Systems  All other systems reviewed and are negative.    Allergies  Ambien; Amoxicillin; and Penicillins  Home Medications   Current Outpatient Rx  Name  Route  Sig  Dispense   Refill  . CRANBERRY PO   Oral   Take 2 capsules by mouth 2 (two) times daily.         Marland Kitchen ECHINACEA EXTRACT PO   Oral   Take 1 tablet by mouth 2 (two) times daily as needed (immune support.).         Marland Kitchen Ginkgo Biloba 40 MG TABS   Oral   Take 1 tablet by mouth 2 (two) times daily.         Marland Kitchen ibuprofen (ADVIL,MOTRIN) 200 MG tablet   Oral   Take 400 mg by mouth every 6 (six) hours as needed for pain.         Marland Kitchen ondansetron (ZOFRAN) 4 MG tablet   Oral   Take 4 mg by mouth every 8 (eight) hours as needed for nausea.         . promethazine (PHENERGAN) 25 MG tablet   Oral   Take 1 tablet (25 mg total) by mouth every 6 (six) hours as needed for nausea.   10 tablet   0   . ST JOHNS WORT PO   Oral   Take 1 capsule by mouth 3 (three) times daily.           BP 130/80  Temp(Src) 98.1 F (36.7 C) (Oral)  Resp 18  SpO2 98%  Physical Exam  Nursing note and vitals reviewed. Constitutional: She is oriented to person, place, and time. She appears well-developed and well-nourished. No distress.  Awake, alert, nontoxic appearance  HENT:  Head: Atraumatic.  Lips are dry, tongue is dry.  Eyes: Conjunctivae are normal. Right eye exhibits no discharge. Left eye exhibits no discharge.  Neck: Neck supple.  Cardiovascular: Normal rate and regular rhythm.   Pulmonary/Chest: Effort normal. No respiratory distress. She exhibits no tenderness.  Abdominal: Soft. There is tenderness (Mild epigastric tenderness on palpation without guarding or rebound tenderness. No McBurney's point, no Murphy sign, no hernia.). There is no rebound.  Musculoskeletal: She exhibits no tenderness.  ROM appears intact, no obvious focal weakness  Neurological: She is alert and oriented to person, place, and time.  Mental status and motor strength appears intact  Skin: No rash noted.  Psychiatric: She has a normal mood and affect.    ED Course  Procedures (including critical care time)  6:34 AM Patient  with history of IBS, fibromyalgia presents with nausea vomiting and diarrhea. She is afebrile, she has a nonsurgical abdomen. She appears to be dehydrated. Workup initiated, IV fluid given. Will continue to monitor.  7:50 AM Patient felt less nauseous after receiving Reglan. Appears more comfortable.  8:26 AM UA with evidence of hemoglobin on urine dipsticks. Patient is currently not on her menstrual period. She has no CVA tenderness and no urinary complaints concerning for kidney stones. Patient has hemoglobin urine dipsticks as compared to prior UA. She relates this to her dehydration. We'll continue with IV fluid. Patient requests for ice chips.  Will also give potassium supplementation as her potassium is 3.3 today.  Patient does have a elevated white count of 16.1. However her abdomen is unremarkable on exam and a urine shows no evidence of UTI. No complaints of chest pain cough or shortness of breath concerning for pneumonia. No skin changes consistent with cellulitis. Patient felt better, tolerates by mouth, and stable for discharge.    10:13 AM Pt continues to vomits, will continue with IVF and will give antiemetic medications.    10:44 AM Patient continues to endorse nausea states she's unable to tolerates PO and request for admission. Since patient has leukocytosis without a known source, and continued to endorse nausea will consult with hospitalist for further management.  12:03 PM i have consulted with [facility] Dr. Ashley Royalty, who agrees to see pt in ER.  Will admit for further care.    Labs Reviewed  CBC WITH DIFFERENTIAL - Abnormal; Notable for the following:    WBC 16.1 (*)    Neutrophils Relative % 85 (*)    Neutro Abs 13.7 (*)    Lymphocytes Relative 10 (*)    All other components within normal limits  COMPREHENSIVE METABOLIC PANEL - Abnormal; Notable for the following:    Potassium 3.3 (*)    Glucose, Bld 150 (*)    All other components within normal limits  URINALYSIS, ROUTINE  W REFLEX MICROSCOPIC - Abnormal; Notable for the following:    Color, Urine AMBER (*)    APPearance TURBID (*)    Hgb urine dipstick LARGE (*)    Bilirubin Urine SMALL (*)    Ketones, ur 15 (*)    Protein, ur 30 (*)    All other components within normal limits  LIPASE, BLOOD  PREGNANCY, URINE  URINE MICROSCOPIC-ADD ON   No results found.   1. Leukocytosis   2. Dehydration   3. Nausea vomiting and diarrhea  MDM  BP 105/56  Pulse 48  Temp(Src) 98.7 F (37.1 C) (Oral)  Resp 14  SpO2 99%  I have reviewed nursing notes and vital signs.  I reviewed available ER/hospitalization records thought the EMR          Fayrene Helper, [place] 05/21/13 1206

## 2013-05-22 DIAGNOSIS — IMO0001 Reserved for inherently not codable concepts without codable children: Secondary | ICD-10-CM

## 2013-05-22 DIAGNOSIS — K589 Irritable bowel syndrome without diarrhea: Secondary | ICD-10-CM

## 2013-05-22 DIAGNOSIS — R197 Diarrhea, unspecified: Secondary | ICD-10-CM

## 2013-05-22 LAB — CBC
HCT: 33.8 % — ABNORMAL LOW (ref 36.0–46.0)
HCT: 34.7 % — ABNORMAL LOW (ref 36.0–46.0)
Hemoglobin: 11.3 g/dL — ABNORMAL LOW (ref 12.0–15.0)
Hemoglobin: 11.8 g/dL — ABNORMAL LOW (ref 12.0–15.0)
MCH: 30.7 pg (ref 26.0–34.0)
MCH: 31.4 pg (ref 26.0–34.0)
MCHC: 33.4 g/dL (ref 30.0–36.0)
MCHC: 34 g/dL (ref 30.0–36.0)
MCV: 91.8 fL (ref 78.0–100.0)
MCV: 92.3 fL (ref 78.0–100.0)
Platelets: 194 10*3/uL (ref 150–400)
RBC: 3.68 MIL/uL — ABNORMAL LOW (ref 3.87–5.11)
RBC: 3.76 MIL/uL — ABNORMAL LOW (ref 3.87–5.11)
RDW: 13.4 % (ref 11.5–15.5)
RDW: 13.3 % (ref 11.5–15.5)
WBC: 6.9 10*3/uL (ref 4.0–10.5)
WBC: 6.4 10*3/uL (ref 4.0–10.5)

## 2013-05-22 LAB — BASIC METABOLIC PANEL
BUN: 11 mg/dL (ref 6–23)
CO2: 25 mEq/L (ref 19–32)
Calcium: 8.3 mg/dL — ABNORMAL LOW (ref 8.4–10.5)
Chloride: 108 mEq/L (ref 96–112)
Creatinine, Ser: 0.79 mg/dL (ref 0.50–1.10)
GFR calc Af Amer: >90 mL/min (ref >90)
GFR calc non Af Amer: >90 mL/min (ref >90)
Glucose, Bld: 102 mg/dL — ABNORMAL HIGH (ref 70–99)
Potassium: 2.9 mEq/L — ABNORMAL LOW (ref 3.5–5.1)
Sodium: 141 mEq/L (ref 135–145)

## 2013-05-22 LAB — URINALYSIS, ROUTINE W REFLEX MICROSCOPIC
Bilirubin Urine: NEGATIVE
Glucose, UA: NEGATIVE mg/dL
Hgb urine dipstick: NEGATIVE
Ketones, ur: NEGATIVE mg/dL
Leukocytes, UA: NEGATIVE
Nitrite: NEGATIVE
Protein, ur: NEGATIVE mg/dL
Specific Gravity, Urine: 1.005 (ref 1.005–1.030)
Urobilinogen, UA: 1 mg/dL (ref 0.0–1.0)
pH: 7 (ref 5.0–8.0)

## 2013-05-22 LAB — HELICOBACTER PYLORI ABS-IGG+IGA, BLD
H Pylori IgA: 3.2 U/mL (ref <9.0)
H Pylori IgG: <0.4 {ISR}

## 2013-05-22 LAB — LIPASE, BLOOD: Lipase: 41 U/L (ref 11–59)

## 2013-05-22 MED ORDER — ONDANSETRON HCL 4 MG/2ML IJ SOLN
4.0000 mg | Freq: Four times a day (QID) | INTRAMUSCULAR | Status: DC
  Administered 2013-05-22 – 2013-05-23 (×4): 4 mg via INTRAVENOUS
  Filled 2013-05-22 (×4): qty 2

## 2013-05-22 MED ORDER — SODIUM CHLORIDE 0.45 % IV SOLN
INTRAVENOUS | Status: DC
  Administered 2013-05-22 – 2013-05-23 (×2): via INTRAVENOUS
  Filled 2013-05-22 (×6): qty 1000

## 2013-05-22 MED ORDER — POTASSIUM CHLORIDE 10 MEQ/100ML IV SOLN
10.0000 meq | INTRAVENOUS | Status: AC
  Administered 2013-05-22: 10 meq via INTRAVENOUS
  Filled 2013-05-22: qty 100

## 2013-05-22 MED ORDER — POTASSIUM CHLORIDE 10 MEQ/100ML IV SOLN
10.0000 meq | INTRAVENOUS | Status: AC
  Administered 2013-05-22 – 2013-05-23 (×3): 10 meq via INTRAVENOUS
  Filled 2013-05-22 (×3): qty 100

## 2013-05-22 NOTE — Progress Notes (Signed)
Spoke with patient's PCP office [facility]) over the phone in regards to her similar N/V episode in May. Per, PCP office patient was seen on May 6th with nausea and vomiting. She was afebrile and had a normal abdominal exam. It was suspected to be a stomach virus. The patient declined to have labs drawn at the time. She was given a 25mg  injection of Phenergan and told to rest and increase fluid intake. She was instructed to return if her symptoms did not improve and was not seen again.   In light of her UDS results, asked about patient's social history regarding marijuana use and was told that she had reported no history or current use of illicit substances.

## 2013-05-22 NOTE — Progress Notes (Signed)
Reviewed and agree.

## 2013-05-22 NOTE — Progress Notes (Signed)
Utilization review completed. Talli Kimmer, RN, BSN. 

## 2013-05-22 NOTE — ED Provider Notes (Signed)
Medical screening examination/treatment/procedure(s) were conducted as a shared visit with non-physician practitioner(s) and myself.  I personally evaluated the patient during the encounter   Gerhard Munch, MD 05/22/13 [phone]

## 2013-05-22 NOTE — Progress Notes (Addendum)
Family Medicine Teaching Service Daily Progress Note Intern Pager: [phone]  Patient name: Jordan Zimmerman Medical record number: [id number] Date of birth: 10-Dec-1970 Age: 42 y.o. Gender: female  Primary Care Provider: Tomi Bamberger, NP  Subjective: Patient states that she is nauseated this morning. She says she has had waves of nausea overnight, but has had no episodes of vomiting which she is pleased with. She was able to drink two small cans of soda without emesis. She reports soreness of her upper epigastric region, but denies any pain beyond her baseline fibromyalgia. Her last BM was yesterday morning and was a baseline, non-bloody stool.  Objective: Temp:  [98.2 F (36.8 C)-99.2 F (37.3 C)] 98.2 F (36.8 C) (06/24 0550) Pulse Rate:  [48-69] 60 (06/24 0550) Resp:  [14-16] 16 (06/24 0550) BP: (96-107)/(48-62) 104/62 mmHg (06/24 0550) SpO2:  [97 %-100 %] 97 % (06/24 0550) Weight:  [178 lb (80.74 kg)-185 lb 4.8 oz (84.052 kg)] 185 lb 4.8 oz (84.052 kg) (06/24 0550) Exam: General: Reclining in bed, no acute distress, mildly uncomfortable Cardiovascular: RRR, no murmur heard Respiratory: CTAB, no increased work of breathing Abdomen: Soft, mild TTP of the epigastric region, otherwise no TTP, soft bowel sounds Extremities: No edema  Laboratory:  Recent Labs Lab 05/21/13 0645 05/22/13 0415  WBC 16.1* 6.9  HGB 14.6 11.3*  HCT 41.8 33.8*  PLT 360 DELTA CHECK NOTED    Recent Labs Lab 05/21/13 0645 05/22/13 0415  NA 142 141  K 3.3* 2.9*  CL 105 108  CO2 22 25  BUN 17 11  CREATININE 0.73 0.79  CALCIUM 9.8 8.3*  PROT 7.9  --   BILITOT 0.7  --   ALKPHOS 92  --   ALT 8  --   AST 21  --   GLUCOSE 150* 102*    Imaging/Diagnostic Tests: 05/22/13 - Repeat UA: Pending collection >>> 05/22/13 - UCx: Ordered >>>  Assessment and Plan: Jordan Zimmerman is a 42 y.o. year old female with PMH significant for IBS, fibromyalgia, and chronic fatigue syndrome presenting with a 2 day  history of nausea and vomiting.   # Nausea/Vomiting: Patient reports non-bloody, non-bilious emesis every 15 to 30 minutes since Saturday PM. She continues to have intermittent nausea, but has been able to tolerate drinking limited liquids and has not had any additional vomiting. She endorses some abdominal tenderness, but denies other pain.  Her UDS was positive for opiates (s/p administration of opiates in the ED) and THC. Patient denies smoking marijuana, but admits that she is "around it" once or twice a month as her brother smokes. - Switch to 1/2 NS + KCL IVF  - Scheduled Zofran 4mg  IV q6hr   - Phenergan 12.5mg  IV q4hr PRN  - Hydromorphone 1mg  q4hr PRN moderate to severe pain  - Consider abdominal imaging if symptoms persist  - Continue to monitor PO tolerance [ ]  Contact PCP for information of prior N/V episode in May  # Hematuria/Proteinuria: UA showed 15 ketones, 30 protein, large Hgb, but was negative for both leukocytes and nitrites. No UCx was ordered. Patient says her LMP ended on 6/10 and denies any current bleeding. She does state that she is premenopausal and typically gets her period twice a month. She denies any change in her urinary baseline, including discoloration, dysuria, and frequency. Cr was normal at 0.73 on admission.  - Ordered repeat UA and UCx [ ]  F/u urine studies   # Leukocytosis: WBC elevated at 16.1 on admission with a  neutrophil predominance of 85%. Patient has been afebrile and denies any fever in the past two weeks. She also denies any sick contacts. WBC has fallen to 6.4 this AM. Patient's Hgb/Hct has also fallen from 14.6/41.8 on admission to 11.3/33.8 and 11.8/34.7 on repeat this morning. Potentially hemo-dilutional change, thought patient had a normal Hgb/Hct of 14.3/40.4 on 04/04/13. - Continue to monitor  [ ]  Repeat CBC tomorrow AM   # Hypokalemia: K is 3.3 on admission. Likely secondary to PO intolerance. Patient received K-DUR in the ED. K  this morning is 2.9. 4 runs of IV KCl ordered and KCl added to IVF. - Continue to monitor [ ]  Repeat BMET tomorrow AM   # IBS: Patient endorses some diarrhea, but states that she has this "on and off" at baseline. Her recent diarrhea has not significantly worsened or changed; there has been no blood.  - Continue to monitor   # Fibromyalgia: Stable, patient has no complaints.  - Continue home regimen of herbal supplements   # Chronic fatigue syndrome: Stable, patient does not endorse increased fatigue.   FEN/GI: Clears, advance as tolerated; IVF1/2 NS at 134ml/hr  Prophylaxis: SQ Heparin  Disposition: Pending clinical improvement in tolerating PO, self-care at home  Code Status: FULL, as discussed with patient on admission   Jordan Zimmerman, Med Student 05/22/2013, 9:17 AM PGY-1, [facility] Family Medicine FPTS Intern pager: [phone]  PGY-2 Addendum S: Improved this morning.  Still nauseated.  No further vomiting or diarrhea.  Slow going with fluids this morning.  Some upper abdominal soreness, that she states feels like it is in the muscle.  O: BP 109/67  Pulse 54  Temp(Src) 98.3 F (36.8 C) (Oral)  Resp 17  Ht 5\' 5"  (1.651 m)  Wt 185 lb 4.8 oz (84.052 kg)  BMI 30.84 kg/m2  SpO2 99%  LMP 05/01/2013 Gen: alert, cooperative, NAD HEENT: AT/[place], sclera white, MMM CV: RRR, no murmurs Abd: +BS, soft, ND, mildly tenderness across upper abdomen Ext: no edema  A/P: see above note for complete A/P.  Briefly, 42 yo F with fibromyalgia, IBS, chronic nausea, here with 2 days of n/v/d.  # N/V/D: Improved.  Possibly cyclic vomiting associated with marijuana exposure.  - will schedule zofran today - phenergan prn - possibly change to PO antiemetics if does better with lunch - discussed avoidance of marijuana to help prevent future flares - no clues on history to guide need for additional work up  # Hypokalemia: 2.9 this morning - change fluids to 1/2NS + 40KCl - KCl runs  x4  # Dispo: pending tolerating PO, anticipate in next 24-48 hours  Jordan Zimmerman, Jordan Zimmerman 05/22/2013, 9:57 AM

## 2013-05-22 NOTE — Progress Notes (Signed)
FMTS Attending Admission Note: Renold Don MD Personal pager:  [phone] FPTS Service Pager:  [phone]  I  have seen and examined this patient, reviewed their chart. I have discussed this patient with the resident. I agree with the resident's findings, assessment and care plan.  Additionally: - Patient improving slowly.  Still with epigastric tenderness but otherwise benign abdomen. - Still with significant nausea.  No overt vomiting.  Not hungry. - Taking fair po fluids but not enough for maintenance yet. - Cont anti-emetics.  Increase diet once tolerated.  - Adding lipase to BMET from today -- not likely pancreatitis, but with epigastric tenderness and history of THC use.  Denies ETOH usage.    Tobey Grim, MD 05/22/2013 2:36 PM

## 2013-05-23 LAB — BASIC METABOLIC PANEL
BUN: 5 mg/dL — ABNORMAL LOW (ref 6–23)
CO2: 21 mEq/L (ref 19–32)
Calcium: 9 mg/dL (ref 8.4–10.5)
Chloride: 109 mEq/L (ref 96–112)
Creatinine, Ser: 0.75 mg/dL (ref 0.50–1.10)
GFR calc Af Amer: >90 mL/min (ref >90)
GFR calc non Af Amer: >90 mL/min (ref >90)
Glucose, Bld: 104 mg/dL — ABNORMAL HIGH (ref 70–99)
Potassium: 3.9 mEq/L (ref 3.5–5.1)
Sodium: 142 mEq/L (ref 135–145)

## 2013-05-23 LAB — CBC
HCT: 37 % (ref 36.0–46.0)
Hemoglobin: 12.8 g/dL (ref 12.0–15.0)
MCH: 31 pg (ref 26.0–34.0)
MCHC: 34.6 g/dL (ref 30.0–36.0)
MCV: 89.6 fL (ref 78.0–100.0)
Platelets: 229 10*3/uL (ref 150–400)
RBC: 4.13 MIL/uL (ref 3.87–5.11)
RDW: 12.9 % (ref 11.5–15.5)
WBC: 7.9 10*3/uL (ref 4.0–10.5)

## 2013-05-23 MED ORDER — HEPARIN SODIUM (PORCINE) 5000 UNIT/ML IJ SOLN
5000.0000 [IU] | Freq: Three times a day (TID) | INTRAMUSCULAR | Status: DC
  Administered 2013-05-23: 5000 [IU] via SUBCUTANEOUS
  Filled 2013-05-23: qty 1

## 2013-05-23 MED ORDER — ONDANSETRON 4 MG PO TBDP
4.0000 mg | ORAL_TABLET | Freq: Three times a day (TID) | ORAL | Status: DC
  Administered 2013-05-23: 4 mg via ORAL
  Filled 2013-05-23 (×5): qty 1

## 2013-05-23 MED ORDER — ONDANSETRON HCL 4 MG PO TABS: 4.0000 mg | ORAL_TABLET | Freq: Three times a day (TID) | ORAL | Status: DC | PRN

## 2013-05-23 NOTE — Progress Notes (Signed)
FMTS Attending Daily Note:  Renold Don MD  [phone] pager  Family Practice pager:  [phone] I have discussed this patient with the resident Dr. Elwyn Reach.  I agree with their findings, assessment, and care plan

## 2013-05-23 NOTE — Progress Notes (Signed)
Family Medicine Teaching Service Daily Progress Note Intern Pager: [phone]  Patient name: Jordan Zimmerman Medical record number: [id number] Date of birth: January 06, 1971 Age: 42 y.o. Gender: female  Primary Care Provider: Tomi Bamberger, NP  Subjective: Patient is doing well today. She states she has been able to tolerate a liquid diet without emesis. She endorses hunger and would like to try solid food today. She denies having any abdominal or throat pain. She mentions that overnight she had recurrent bouts of non-bloody, no mucous diarrhea. She says this is identical to other IBS flares she has had in the past and feels as though it is resolving.  Objective: Temp:  [98.2 F (36.8 C)-98.6 F (37 C)] 98.2 F (36.8 C) (06/25 0614) Pulse Rate:  [54-93] 93 (06/25 0614) Resp:  [15-18] 18 (06/25 0614) BP: (96-110)/(56-69) 96/56 mmHg (06/25 0614) SpO2:  [96 %-100 %] 99 % (06/25 0614) Weight:  [80 lb 6.4 oz (36.469 kg)] 80 lb 6.4 oz (36.469 kg) (06/25 1478) Exam: General: Well-appearing, sitting up in bed, eating soup Cardiovascular: RRR, no murmur Respiratory: CTAB, no increased work of breathing, good air movement Abdomen: Soft, no TTP, active bowel sounds Extremities: No edema  Laboratory:  Recent Labs Lab 05/22/13 0415 05/22/13 0818 05/23/13 0845  WBC 6.9 6.4 7.9  HGB 11.3* 11.8* 12.8  HCT 33.8* 34.7* 37.0  PLT DELTA CHECK NOTED 194 229    Recent Labs Lab 05/21/13 0645 05/22/13 0415 05/23/13 0557  NA 142 141 142  K 3.3* 2.9* 3.9  CL 105 108 109  CO2 22 25 21   BUN 17 11 5*  CREATININE 0.73 0.79 0.75  CALCIUM 9.8 8.3* 9.0  PROT 7.9  --   --   BILITOT 0.7  --   --   ALKPHOS 92  --   --   ALT 8  --   --   AST 21  --   --   GLUCOSE 150* 102* 104*     Assessment and Plan: Jordan Zimmerman is a 42 y.o. year old female with PMH significant for IBS, fibromyalgia, and chronic fatigue syndrome presenting with a 2 day history of nausea and vomiting.   # Nausea/Vomiting: Patient  reports non-bloody, non-bilious emesis every 15 to 30 minutes since Saturday PM. She continues to have intermittent nausea, but has been able to tolerate drinking limited liquids and has not had any additional vomiting. She endorses some abdominal tenderness, but denies other pain. Her UDS was positive for opiates (s/p administration of opiates in the ED) and THC. Patient denies smoking marijuana, but admits that she is "around it" once or twice a month as her brother smokes. She has not had any vomiting x48 hours and her nausea has subsided. PCP was contacted on 6/24 regarding her N/V in May (see note) and felt it to be a stomach virus. - Continued 1/2 NS + KCL IVF  - Transitioned patient to scheduled Zofran 4mg  oral q6hr  - Phenergan 12.5mg  IV q4hr PRN  - Hydromorphone 1mg  q4hr PRN moderate to severe pain  - Continue to monitor PO tolerance, likely discharge if patient tolerates PO meds and food - Patient advised to avoid marijuana exposure as this may be a trigger for her vomiting  # Hematuria/Proteinuria: UA showed 15 ketones, 30 protein, large Hgb, but was negative for both leukocytes and nitrites. No UCx was ordered. Patient says her LMP ended on 6/10 and denies any current bleeding. She does state that she is premenopausal and typically gets  her period twice a month. She denies any change in her urinary baseline, including discoloration, dysuria, and frequency. Cr was normal at 0.73 on admission.  - Repeat UA was normal [ ]  F/u urine culture  # Leukocytosis: WBC elevated at 16.1 on admission with a neutrophil predominance of 85%. Patient has been afebrile and denies any fever in the past two weeks. She also denies any sick contacts. WBC has fallen to 6.4 this AM. Patient's Hgb/Hct has also fallen from 14.6/41.8 on admission to 11.3/33.8 and 11.8/34.7 on repeat this morning. Potentially hemo-dilutional change, though patient had a normal Hgb/Hct of 14.3/40.4 on 04/04/13. On 6/25, patient has a  normal CBC (see labs above).  # Hypokalemia: K is 3.3 on admission. Likely secondary to PO intolerance. Patient received K-DUR in the ED. K on 6/24 is 2.9. 4 runs of IV KCl ordered and KCl added to IVF. K on 6/25 has improved to 3.9.  # IBS: Patient endorses some diarrhea, but states that she has this "on and off" at baseline. She had some diarrhea overnight. She denies any blood in the stool.  She feels this is the result of an IBS flare. She is afebrile, does not have an elevated white count, and has a benign abdominal exam. - Continue to monitor   # Fibromyalgia: Stable, patient has no complaints.  - Continue home regimen of herbal supplements   # Chronic fatigue syndrome: Stable, patient does not endorse increased fatigue.   FEN/GI: Clears, advance as tolerated; IVF1/2 NS at 158ml/hr  Prophylaxis: SQ Heparin  Disposition: Pending clinical improvement in tolerating PO, self-care at home  Code Status: FULL, as discussed with patient on admission   Jordan Zimmerman, Med Student 05/23/2013, 9:16 AM PGY-1, [facility] Family Medicine FPTS Intern pager: [phone]  PGY-2 Addendum S: Doing well this morning.  Denies nausea, vomiting and abdominal pain.  Wants to eat real food.  Did have watery diarrhea overnight, reports typical of her IBS flares, no blood, starting to improve already.  O: BP 96/56  Pulse 93  Temp(Src) 98.2 F (36.8 C) (Oral)  Resp 18  Ht 5\' 5"  (1.651 m)  Wt 80 lb 6.4 oz (36.469 kg)  BMI 13.38 kg/m2  SpO2 99%  LMP 05/01/2013 Gen: alert, cooperative, NAD HEENT: MMM Abd: +BS, soft, NTND  A/P: Agree with above note, briefly... 42 yo F with fibromyalgia, IBS, chronic nausea admitted with intractable nausea and vomiting.  # N/V: Much improved.  Likely cyclic vomiting, possibly related to marijuana exposure. - transition to PO zofran today - will KVO fluids - advance diet as tolerated - d/c home if tolerates lunch  # Diarrhea: Likely IBS flare.  No risk  factors for c diff.  Abdominal exam benign. - continue to monitor  # Abnormal urine: Repeat UA completely normal.  - f/u urine culture  # Dispo: anticipate this afternoon if tolerates lunch  BOOTH, ERIN 05/23/2013, 9:46 AM

## 2013-05-23 NOTE — Discharge Summary (Signed)
Pt being d/ced home, awaiting ride home from her husband

## 2013-05-23 NOTE — Discharge Summary (Signed)
Family Medicine Teaching [facility] Discharge Summary  Patient name: Jordan Zimmerman Medical record number: [id number] Date of birth: 08/04/1971 Age: 42 y.o. Gender: female Date of Admission: 05/21/2013  Date of Discharge: 05/23/2013 Admitting Physician: Everrett Coombe, DO  Primary Care Provider: Tomi Bamberger, NP  Indication for Hospitalization: Nausea and vomiting Discharge Diagnoses:  Nausea and vomiting - resolved Hypokalemia - resolved Hematuria/proteinuria - resolved History of IBS  History of fibromyalgia History of chronic fatigue syndrome  Consultations: None  Significant Labs and Imaging:   Recent Labs Lab 05/21/13 0645 05/22/13 0415 05/22/13 0818 05/23/13 0845  WBC 16.1* 6.9 6.4 7.9  HGB 14.6 11.3* 11.8* 12.8  HCT 41.8 33.8* 34.7* 37.0  PLT 360 DELTA CHECK NOTED 194 229    Recent Labs Lab 05/21/13 0645 05/22/13 0415 05/23/13 0557  NA 142 141 142  K 3.3* 2.9* 3.9  CL 105 108 109  CO2 22 25 21   GLUCOSE 150* 102* 104*  BUN 17 11 5*  CREATININE 0.73 0.79 0.75  CALCIUM 9.8 8.3* 9.0  ALKPHOS 92  --   --   AST 21  --   --   ALT 8  --   --   ALBUMIN 4.7  --   --    05/21/13 - UA: Amber, turbid, 15 ketones, 30 protein, large Hgb; negative leukocytes and nitrites 05/22/13 - Repeat UA: Normal 05/22/13 - UCx: Pending >>>   Procedures: None  Brief Hospital Course: Jordan Zimmerman is a 42 y.o. year old female with PMH significant for IBS, fibromyalgia, and chronic fatigue syndrome presenting with a 2 day history of nausea and vomiting. Please see H&P for more detail. The patient was treated with IV and oral anti-emetics and was discharged with controlled nausea and tolerating PO well. The details of her hospital course are as below by problem:  # Nausea/Vomiting: Patient presented with intractable nausea and vomiting for 2 days.  Differential includes gastroenteritis vs. cyclic vomiting syndrome, with marijuana as trigger. She initially endorsed some throat pain and  epigastric tenderness, both of which resolved over the course of her admission. PCP was contacted on 6/24 regarding her similar episode of N/V in May (see note), which was felt to be a stomach virus. Patient had intermittent nausea throughout her stay, but was able to tolerate PO liquids without additional vomiting. Her UDS was positive for opiates (s/p administration of opiates in the ED) and THC. Patient denies smoking marijuana, but admits that she is "around it" once or twice a month as her brother smokes. She was advised to avoid marijuana exposure as this may be a trigger for her vomiting. On day of discharge, IVFs were stopped and patient was transitioned to oral Zofran 4mg  q6hr without issues. She was able to tolerate a normal diet at lunch lunch and felt well prior to discharge.   # Hematuria/Proteinuria: UA on admission showed 15 ketones, 30 protein, large Hgb, but was negative for both leukocytes and nitrites. Potentially due to dehydration. Patient says her LMP ended on 6/10 and denies any current bleeding. She does state that she is premenopausal and typically gets her period twice a month. She denies any change in her urinary baseline, including discoloration, dysuria, and frequency. Cr was normal at 0.73 on admission. Repeat UA on 6/24 returned normal.  # Leukocytosis: WBC elevated at 16.1 on admission with a neutrophil predominance of 85%. Patient has been afebrile and denies any fever in the past two weeks. She also denies any sick contacts.  White count normalized after 24 hours.  This was likely de-margination secondary to vomiting.   # Hypokalemia: K was 3.3 on admission. Likely secondary to PO intolerance. Patient received K-DUR in the ED. K on 6/24 is 2.9. 4 runs of IV KCl ordered and KCl added to IVF. K was 3.9 at discharge.   # IBS: Patient endorses some diarrhea, but states that she has this "on and off" at baseline.  She denies any blood in the stool. She feels this is  the result of an IBS flare. She is afebrile, does not have an elevated white count, and has a benign abdominal exam.   # Fibromyalgia: Stable, patient has no complaints. Continued home regimen of herbal supplements.   # Chronic fatigue syndrome: Stable, patient does not endorse increased fatigue.   Discharge Medications:    Medication List    TAKE these medications       CRANBERRY PO  Take 2 capsules by mouth 2 (two) times daily.     ECHINACEA EXTRACT PO  Take 1 tablet by mouth 2 (two) times daily as needed (immune support.).     Ginkgo Biloba 40 MG Tabs  Take 1 tablet by mouth 2 (two) times daily.     ondansetron 4 MG tablet  Commonly known as:  ZOFRAN  Take 1 tablet (4 mg total) by mouth every 8 (eight) hours as needed for nausea.     ST JOHNS WORT PO  Take 1 capsule by mouth 3 (three) times daily.       Disposition: Self-care at home  Issues for Follow Up: None  Outstanding Results:  05/22/13 - UCx: Pending >>>  Discharge Instructions: Please refer to Patient Instructions section of EMR for full details.  Patient was counseled important signs and symptoms that should prompt return to medical care, changes in medications, dietary instructions, activity restrictions, and follow up appointments.   Follow-up Information   Call [facility], NP. (for an appt in 1-2 weeks)    Contact information:   [address] [place] [address] [phone]     Discharge Condition: Clinically improved, tolerating PO intake  BOOTH, Orvilla Truett, MD 05/23/2013, 2:20 PM

## 2013-05-24 LAB — URINE CULTURE: Colony Count: >=100000

## 2013-05-26 NOTE — Discharge Summary (Signed)
Family Medicine Teaching Service  Discharge Note : Attending Renold Don MD Pager [phone] Inpatient Team Pager:  [phone]  I have reviewed this patient and the patient's chart and have discussed discharge planning with the resident at the time of discharge. I agree with the discharge plan as above.  My only addition:  - I had her go through her herbal supplements on admission with me.  From what I saw, nothing should cause nausea/vomiting/epigastric pain, but I warned her that supplements are not regulated and that these may indeed be contributing to her symptoms.  If N/V recurs, she should stop these and gauge if any further symptoms after cessation.

## 2013-07-10 ENCOUNTER — Other Ambulatory Visit (HOSPITAL_COMMUNITY): Payer: Self-pay | Admitting: Emergency Medicine

## 2013-10-04 ENCOUNTER — Other Ambulatory Visit: Payer: Self-pay

## 2013-11-12 ENCOUNTER — Other Ambulatory Visit: Payer: Self-pay

## 2013-11-12 DIAGNOSIS — Z1231 Encounter for screening mammogram for malignant neoplasm of breast: Secondary | ICD-10-CM

## 2013-11-15 ENCOUNTER — Encounter: Payer: Self-pay | Admitting: Nurse Practitioner

## 2013-12-18 ENCOUNTER — Ambulatory Visit: Payer: Managed Care, Other (non HMO)

## 2016-11-16 ENCOUNTER — Encounter: Payer: Self-pay | Admitting: Obstetrics & Gynecology

## 2016-11-16 ENCOUNTER — Other Ambulatory Visit (HOSPITAL_COMMUNITY)
Admission: RE | Admit: 2016-11-16 | Discharge: 2016-11-16 | Disposition: A | Discharge status: Home / Self Care | Payer: Medicaid Other | Source: Ambulatory Visit | Attending: Obstetrics & Gynecology | Admitting: Obstetrics & Gynecology

## 2016-11-16 ENCOUNTER — Ambulatory Visit (INDEPENDENT_AMBULATORY_CARE_PROVIDER_SITE_OTHER): Payer: Managed Care, Other (non HMO) | Admitting: Obstetrics & Gynecology

## 2016-11-16 VITALS — BP 107/74 | HR 106 | Wt 124.0 lb

## 2016-11-16 DIAGNOSIS — Z1231 Encounter for screening mammogram for malignant neoplasm of breast: Secondary | ICD-10-CM

## 2016-11-16 DIAGNOSIS — Z113 Encounter for screening for infections with a predominantly sexual mode of transmission: Secondary | ICD-10-CM | POA: Insufficient documentation

## 2016-11-16 DIAGNOSIS — Z1151 Encounter for screening for human papillomavirus (HPV): Secondary | ICD-10-CM | POA: Diagnosis not present

## 2016-11-16 DIAGNOSIS — Z0000 Encounter for general adult medical examination without abnormal findings: Secondary | ICD-10-CM

## 2016-11-16 DIAGNOSIS — N898 Other specified noninflammatory disorders of vagina: Secondary | ICD-10-CM

## 2016-11-16 DIAGNOSIS — R102 Pelvic and perineal pain: Secondary | ICD-10-CM

## 2016-11-16 DIAGNOSIS — Z01419 Encounter for gynecological examination (general) (routine) without abnormal findings: Secondary | ICD-10-CM | POA: Insufficient documentation

## 2016-11-16 DIAGNOSIS — N951 Menopausal and female climacteric states: Secondary | ICD-10-CM

## 2016-11-16 MED ORDER — GABAPENTIN 600 MG PO TABS: 600.0000 mg | ORAL_TABLET | Freq: Two times a day (BID) | ORAL | 3 refills | Status: DC

## 2016-11-16 MED ORDER — NAPROXEN 500 MG PO TABS: 500.0000 mg | ORAL_TABLET | Freq: Two times a day (BID) | ORAL | 2 refills | Status: DC

## 2016-11-16 NOTE — Progress Notes (Signed)
GYNECOLOGY ANNUAL PREVENTATIVE CARE ENCOUNTER NOTE  Subjective:   Jordan Zimmerman is a [age] y.o. 12P2000 female here for a routine annual gynecologic exam.  Current complaints: pelvic pain especially around the time of her period which she attributes to possible ovarian cysts.  Pain is debilitating, can also start 1-2 weeks prior to period. Alleviated by NSAIDs.  Also reports increased clear vaginal discharge, no pruritus.  Also reports increased night sweats, hot flashes and mood swings.  Feels she is perimenopausal; still has regular periods for now though.   Denies abnormal vaginal bleeding,  problems with intercourse or other gynecologic concerns.    Gynecologic History Contraception: none Last Pap: 2014. Results were: normal Last mammogram: 2013. Results were: normal  Obstetric History OB History  Gravida Para Term Preterm AB Living  2 2 2         SAB TAB Ectopic Multiple Live Births          2    # Outcome Date GA Lbr Len/2nd Weight Sex Delivery Anes PTL Lv  2 Term      Vag-Spont     1 Term      Vag-Spont         Past Medical History:  Diagnosis Date  . Chronic fatigue   . Fibromyalgia   . IBS (irritable bowel syndrome)   . Kidney stones   . Migraines     Past Surgical History:  Procedure Laterality Date  . CHOLECYSTECTOMY  2001  . CYSTOSCOPY W/ URETEROSCOPY  ~ 2008   "pulled stone rest of the way out" (05/21/2013)  . TONSILLECTOMY  1992    Current Outpatient Prescriptions on File Prior to Visit  Medication Sig Dispense Refill  . CRANBERRY PO Take 2 capsules by mouth 2 (two) times daily.    Marland Kitchen. ECHINACEA EXTRACT PO Take 1 tablet by mouth 2 (two) times daily as needed (immune support.).    Marland Kitchen. Ginkgo Biloba 40 MG TABS Take 1 tablet by mouth 2 (two) times daily.    . ondansetron (ZOFRAN) 4 MG tablet Take 1 tablet (4 mg total) by mouth every 8 (eight) hours as needed for nausea. 20 tablet 0  . ST JOHNS WORT PO Take 1 capsule by mouth 3 (three) times daily.     No  current facility-administered medications on file prior to visit.     Allergies  Allergen Reactions  . Ambien [Zolpidem Tartrate] Anaphylaxis  . Amoxicillin Hives and Swelling    Fever.   . Penicillins Hives    Social History   Social History  . Marital status: Married    Spouse name: N/A  . Number of children: N/A  . Years of education: N/A   Occupational History  . Not on file.   Social History Main Topics  . Smoking status: Former Smoker    Packs/day: 1.00    Years: 4.00    Types: Cigarettes    Quit date: 11/29/1994  . Smokeless tobacco: Never Used  . Alcohol use Yes     Comment: 05/21/2013 "mixed drink 3-4-5 X/yr"   . Drug use: No  . Sexual activity: Yes   Other Topics Concern  . Not on file   Social History Narrative  . No narrative on file    Family History  Problem Relation Age of Onset  . Diabetes Mother   . Cancer Mother   . Hypertension Mother   . Hypertension Father   . Diabetes Father   . Cancer Father  The following portions of the patient's history were reviewed and updated as appropriate: allergies, current medications, past family history, past medical history, past social history, past surgical history and problem list.  Review of Systems Pertinent items noted in HPI and remainder of comprehensive ROS otherwise negative.   Objective:  BP 107/74 (BP Location: Left Arm, Patient Position: Sitting, Cuff Size: Small)   Pulse (!) 106   Wt 124 lb (56.2 kg)   BMI 20.63 kg/m  CONSTITUTIONAL: Well-developed, well-nourished female in no acute distress.  HENT:  Normocephalic, atraumatic, External right and left ear normal. Oropharynx is clear and moist EYES: Conjunctivae and EOM are normal. Pupils are equal, round, and reactive to light. No scleral icterus.  NECK: Normal range of motion, supple, no masses.  Normal thyroid.  SKIN: Skin is warm and dry. No rash noted. Not diaphoretic. No erythema. No pallor. NEUROLOGIC: Alert and oriented to  person, place, and time. Normal reflexes, muscle tone coordination. No cranial nerve deficit noted. PSYCHIATRIC: Normal mood and affect. Normal behavior. Normal judgment and thought content. CARDIOVASCULAR: Normal heart rate noted, regular rhythm RESPIRATORY: Clear to auscultation bilaterally. Effort and breath sounds normal, no problems with respiration noted. BREASTS: Symmetric in size. No masses, skin changes, nipple drainage, or lymphadenopathy. ABDOMEN: Soft, normal bowel sounds, no distention noted.  Mild midline lower abdominal tenderness, no rebound or guarding. No masses. PELVIC: Normal appearing external genitalia; normal appearing vaginal mucosa and cervix.  Clear discharge noted.  Pap smear obtained.  Normal uterine size, no other palpable masses, mild uterine and right adnexal tenderness on bimanual exam. MUSCULOSKELETAL: Normal range of motion. No tenderness.  No cyanosis, clubbing, or edema.  2+ distal pulses.   Assessment and Plan:  1. Pelvic pain in female Unsure etiology of her pain.  Infection ancillary testing done off pap smear. Will obtain pelvic scan and follow up results.  Naproxen prescribed as needed for pain. - Cytology - PAP - US Pelvis Complete; Future - US Transvaginal Non-OB; Future  2. Vaginal discharge Will follow up ancillary testing and manage accordingly. Patient assured it was likely physiologic.  - Cytology - PAP  3. Perimenopausal vasomotor symptoms Will do trial of Neurontin for now. She may benefit more from Paxil vs Effexor; she is already on Cymbalta.  Will talk to her prescribing MD about possibly switching medications. No need for hormonal therapy at this point. - gabapentin (NEURONTIN) 600 MG tablet; Take 1 tablet (600 mg total) by mouth 2 (two) times daily.  Dispense: 60 tablet; Refill: 3  4. Visit for screening mammogram Mammogram scheduled - MM DIGITAL SCREENING BILATERAL; Future  5. Encounter for gynecological examination with  Papanicolaou smear of cervix - Cytology - PAP Will follow up results of pap smear and manage accordingly. Routine preventative health maintenance measures emphasized. Please refer to After Visit Summary for other counseling recommendations.    Total face-to-face time with patient: 30 minutes. About 20 minutes of this encounter was spent on counseling and coordination of care regarding to pelvic pain, vasomotor symptoms and vaginal discharge.    Jaynie CollinsUGONNA  Johnika Escareno, MD, FACOG Attending Obstetrician & Gynecologist, [facility]

## 2016-11-16 NOTE — Patient Instructions (Signed)

## 2016-11-18 LAB — CYTOLOGY - PAP
Chlamydia: NEGATIVE
Diagnosis: NEGATIVE
HPV: NOT DETECTED
Neisseria Gonorrhea: NEGATIVE
Trichomonas: NEGATIVE

## 2016-11-19 ENCOUNTER — Ambulatory Visit
Admission: RE | Admit: 2016-11-19 | Discharge: 2016-11-19 | Disposition: A | Discharge status: Home / Self Care | Payer: Medicaid Other | Source: Ambulatory Visit | Attending: Obstetrics & Gynecology | Admitting: Obstetrics & Gynecology

## 2016-11-19 DIAGNOSIS — R102 Pelvic and perineal pain: Secondary | ICD-10-CM | POA: Insufficient documentation

## 2016-11-19 LAB — CERVICOVAGINAL ANCILLARY ONLY
Bacterial vaginitis: NEGATIVE
Candida vaginitis: NEGATIVE

## 2016-11-23 ENCOUNTER — Telehealth: Payer: Self-pay

## 2016-11-23 NOTE — Telephone Encounter (Signed)
Amber from [facility] called and left a message questioning the Gabapentin 600mg  order for this patient. Please call her back with confirmation of this medication. At [phone]   Call received Friday 12/22 @ 1529

## 2016-11-24 NOTE — Telephone Encounter (Signed)
Hi Lauren, I called pharmacy they stated we need to get a prior authorization for this med.

## 2016-12-10 ENCOUNTER — Ambulatory Visit
Admission: RE | Admit: 2016-12-10 | Discharge: 2016-12-10 | Disposition: A | Discharge status: Home / Self Care | Payer: Medicaid Other | Source: Ambulatory Visit | Attending: Obstetrics & Gynecology | Admitting: Obstetrics & Gynecology

## 2016-12-10 DIAGNOSIS — Z1231 Encounter for screening mammogram for malignant neoplasm of breast: Secondary | ICD-10-CM | POA: Diagnosis present

## 2017-03-09 ENCOUNTER — Encounter (HOSPITAL_COMMUNITY): Payer: Self-pay

## 2017-03-09 ENCOUNTER — Emergency Department (HOSPITAL_COMMUNITY)
Admission: EM | Admit: 2017-03-09 | Discharge: 2017-03-09 | Disposition: A | Discharge status: Home / Self Care | Payer: Medicaid Other | Attending: Emergency Medicine | Admitting: Emergency Medicine

## 2017-03-09 DIAGNOSIS — Z87891 Personal history of nicotine dependence: Secondary | ICD-10-CM | POA: Insufficient documentation

## 2017-03-09 DIAGNOSIS — Z760 Encounter for issue of repeat prescription: Secondary | ICD-10-CM

## 2017-03-09 HISTORY — DX: Anxiety disorder, unspecified: F41.9

## 2017-03-09 MED ORDER — DULOXETINE HCL 60 MG PO CPEP: 60.0000 mg | ORAL_CAPSULE | Freq: Every day | ORAL | 0 refills | Status: DC

## 2017-03-09 MED ORDER — METHOCARBAMOL 750 MG PO TABS: 750.0000 mg | ORAL_TABLET | Freq: Four times a day (QID) | ORAL | 0 refills | Status: AC

## 2017-03-09 NOTE — ED Provider Notes (Signed)
[facility]-EMERGENCY DEPT Provider Note   CSN: [id number] Arrival date & time: 03/09/17  4098  By signing my name below, I, Jordan Zimmerman, attest that this documentation has been prepared under the direction and in the presence of Audry Pili, PA-C. Electronically Signed: Freida Zimmerman, Scribe. 03/09/2017. 10:56 AM.  History   Chief Complaint Chief Complaint  Patient presents with  . Medication Refill   The history is provided by the patient. No language interpreter was used.    HPI Comments:  Jordan Zimmerman No is a 46 y.o. female who presents to the Emergency Department for a medication refill. She states her PCP told her she was merging with a new practice but when the pt went to make an appointment with this new practice she was told that her PCP had not merged with them. She has tried to make an appointment with a new PCP but upon getting to the office she was told they did not accept Medicaid. [facility] then went to [facility] in hopes of having her medications refilled but they advised her to come to the ED. Pt states she has been without  Robaxin for 1 month. She has one more dose of Cymbalta left; states she has been spacing out the doses. She is also out of her Klonopin but states she has an upcoming refill for it. At this time pt has no acute complaints or  symptoms.   Past Medical History:  Diagnosis Date  . Anxiety   . Chronic fatigue   . Fibromyalgia   . IBS (irritable bowel syndrome)   . Kidney stones   . Migraines     Patient Active Problem List   Diagnosis Date Noted  . Fibromyalgia 05/21/2013  . Irritable bowel syndrome 05/21/2013  . Dehydration 05/21/2013  . Nausea and vomiting 05/21/2013    Past Surgical History:  Procedure Laterality Date  . CHOLECYSTECTOMY  2001  . CYSTOSCOPY W/ URETEROSCOPY  ~ 2008   "pulled stone rest of the way out" (05/21/2013)  . TONSILLECTOMY  1992    OB History    Gravida Para Term Preterm AB Living   SAB TAB Ectopic  Multiple Live Births           2       Home Medications    Prior to Admission medications   Medication Sig Start Date End Date Taking? Authorizing Provider  clonazePAM (KLONOPIN) 0.5 MG tablet Take 0.5 mg by mouth 2 (two) times daily as needed for anxiety.    Historical Provider, MD  CRANBERRY PO Take 2 capsules by mouth 2 (two) times daily.    Historical Provider, MD  DULoxetine (CYMBALTA) 60 MG capsule Take 60 mg by mouth daily.    Historical Provider, MD  ECHINACEA EXTRACT PO Take 1 tablet by mouth 2 (two) times daily as needed (immune support.).    Historical Provider, MD  gabapentin (NEURONTIN) 600 MG tablet Take 1 tablet (600 mg total) by mouth 2 (two) times daily. 11/16/16   Tereso Newcomer, MD  Ginkgo Biloba 40 MG TABS Take 1 tablet by mouth 2 (two) times daily.    Historical Provider, MD  ibuprofen (ADVIL,MOTRIN) 200 MG tablet Take 200 mg by mouth every 6 (six) hours as needed.    Historical Provider, MD  methocarbamol (ROBAXIN) 750 MG tablet Take 750 mg by mouth 4 (four) times daily.    Historical Provider, MD  naproxen (NAPROSYN) 500 MG  tablet Take 1 tablet (500 mg total) by mouth 2 (two) times daily with a meal. As needed for pain 11/16/16   Tereso Newcomer, MD  ondansetron (ZOFRAN) 4 MG tablet Take 1 tablet (4 mg total) by mouth every 8 (eight) hours as needed for nausea. 05/23/13   Charm Rings, MD  ST JOHNS WORT PO Take 1 capsule by mouth 3 (three) times daily.    Historical Provider, MD    Family History Family History  Problem Relation Age of Onset  . Diabetes Mother   . Cancer Mother   . Hypertension Mother   . Hypertension Father   . Diabetes Father   . Cancer Father   . Breast cancer Maternal Aunt   . Breast cancer Maternal Grandmother     Social History Social History  Substance Use Topics  . Smoking status: Former Smoker    Packs/day: 1.00    Years: 4.00    Types: Cigarettes    Quit date: 11/29/1994  . Smokeless tobacco: Never Used  . Alcohol use Yes       Comment: 05/21/2013 "mixed drink 3-4-5 X/yr"      Allergies   Ambien [zolpidem tartrate]; Amoxicillin; and Penicillins   Review of Systems Review of Systems  Constitutional: Negative for chills and fever.       +medication refill   Respiratory: Negative for shortness of breath.   Cardiovascular: Negative for chest pain.  Neurological: Negative for dizziness, weakness and numbness.   Physical Exam Updated Vital Signs BP 127/80 (BP Location: Right Arm)   Pulse 90   Temp 98.4 F (36.9 C) (Oral)   Resp 17   SpO2 100%   Physical Exam  Constitutional: She is oriented to person, place, and time. She appears well-developed and well-nourished. No distress.  HENT:  Head: Normocephalic and atraumatic.  Eyes: Conjunctivae are normal.  Cardiovascular: Normal rate.   Pulmonary/Chest: Effort normal.  Abdominal: She exhibits no distension.  Neurological: She is alert and oriented to person, place, and time.  Skin: Skin is warm and dry.  Psychiatric: She has a normal mood and affect.  Nursing note and vitals reviewed.  ED Treatments / Results  DIAGNOSTIC STUDIES:  Oxygen Saturation is 100% on RA, normal by my interpretation.    COORDINATION OF CARE:  10:50 AM Case management will see pt at bedside. Discussed plan with pt at bedside and pt agreed to plan.  Labs (all labs ordered are listed, but only abnormal results are displayed) Labs Reviewed - No data to display  EKG  EKG Interpretation None      Radiology No results found.  Procedures Procedures (including critical care time)  Medications Ordered in ED Medications - No data to display   Initial Impression / Assessment and Plan / ED Course  I have reviewed the triage vital signs and the nursing notes.  Pertinent labs & imaging results that were available during my care of the patient were reviewed by me and considered in my medical decision making (see chart for details).     {I have reviewed the relevant  previous healthcare records.  {I obtained HPI from historian.   ED Course:  Assessment: Pt here for refill of medication. Will refill Duloxetine, and Methocarbamol. Counseled pt on my inability to refill chronic anxiety meds such as Klonopin. Pt states she has 1 more refill of her Klonopin Discussed need to follow up with PCP.  Case management consulted and assisted with follow up. Pt is safe for  discharge at this time.   Disposition/Plan:  DC Home Additional Verbal discharge instructions given and discussed with patient.  Pt Instructed to f/u with PCP in the next week for evaluation and treatment of symptoms. Return precautions given Pt acknowledges and agrees with plan  Supervising Physician Mancel Bale, MD  Final Clinical Impressions(s) / ED Diagnoses   Final diagnoses:  Medication refill    New Prescriptions New Prescriptions   No medications on file   I personally performed the services described in this documentation, which was scribed in my presence. The recorded information has been reviewed and is accurate.    Audry Pili, PA-C 03/09/17 1104    Mancel Bale, MD 03/12/17 2217

## 2017-03-09 NOTE — ED Triage Notes (Signed)
Pt states she is here because "my primary care doctor is no where to be found and I need a few medications refilled." Pt requesting the following medication refills: Clonazepam, Duloxetine, and Methocarbamol.

## 2017-03-09 NOTE — ED Notes (Signed)
case management visit Complete

## 2017-03-09 NOTE — ED Notes (Signed)
Case management visit complete and patient verbalizes understanding

## 2017-03-09 NOTE — ED Notes (Signed)
Got patient from the waiting room pt is resting waiting on provider

## 2017-03-09 NOTE — Discharge Instructions (Signed)
Please read and follow all provided instructions.  Your diagnoses today include:  1. Medication refill     Tests performed today include: Vital signs. See below for your results today.   Medications prescribed:  Take as prescribed   Home care instructions:  Follow any educational materials contained in this packet.  Follow-up instructions: Please follow-up with your primary care provider for further evaluation of symptoms and treatment   Return instructions:  Please return to the Emergency Department if you do not get better, if you get worse, or new symptoms OR  - Fever (temperature greater than 101.53F)  - Bleeding that does not stop with holding pressure to the area    -Severe pain (please note that you may be more sore the day after your accident)  - Chest Pain  - Difficulty breathing  - Severe nausea or vomiting  - Inability to tolerate food and liquids  - Passing out  - Skin becoming red around your wounds  - Change in mental status (confusion or lethargy)  - New numbness or weakness    Please return if you have any other emergent concerns.  Additional Information:  Your vital signs today were: BP 127/80 (BP Location: Right Arm)    Pulse 90    Temp 98.4 F (36.9 C) (Oral)    Resp 17    SpO2 100%  If your blood pressure (BP) was elevated above 135/85 this visit, please have this repeated by your doctor within one month. ---------------

## 2017-03-16 ENCOUNTER — Other Ambulatory Visit (INDEPENDENT_AMBULATORY_CARE_PROVIDER_SITE_OTHER): Payer: Self-pay | Admitting: Physician Assistant

## 2017-03-16 ENCOUNTER — Ambulatory Visit (INDEPENDENT_AMBULATORY_CARE_PROVIDER_SITE_OTHER): Payer: Medicaid Other | Admitting: Physician Assistant

## 2017-03-17 LAB — CBC WITH DIFFERENTIAL/PLATELET
Basophils Absolute: 0 10*3/uL
Basos: 0 %
EOS (ABSOLUTE): 0.2 10*3/uL
Eos: 2 %
Hematocrit: 41.1 %
Hemoglobin: 13.5 g/dL
Immature Grans (Abs): 0 10*3/uL
Immature Granulocytes: 0 %
Lymphocytes Absolute: 3.1 10*3/uL
Lymphs: 30 %
MCH: 32 pg
MCHC: 32.8 g/dL
MCV: 97 fL
Monocytes Absolute: 0.5 10*3/uL
Monocytes: 5 %
Neutrophils Absolute: 6.6 10*3/uL
Neutrophils: 63 %
Platelets: 388 10*3/uL
RBC: 4.22 x10E6/uL
RDW: 13.6 %
WBC: 10.4 10*3/uL

## 2017-03-17 LAB — COMPREHENSIVE METABOLIC PANEL
ALT: 7 IU/L
AST: 21 IU/L
Albumin/Globulin Ratio: 2.2
Albumin: 5.2 g/dL
Alkaline Phosphatase: 80 IU/L
BUN/Creatinine Ratio: 18
BUN: 14 mg/dL
Bilirubin Total: 0.3 mg/dL
CO2: 22 mmol/L
Calcium: 10 mg/dL
Chloride: 101 mmol/L (ref 96–106)
Creatinine, Ser: 0.79 mg/dL
Globulin, Total: 2.4 g/dL (ref 1.5–4.5)
Glucose: 100 mg/dL — ABNORMAL HIGH (ref 65–99)
Potassium: 4.7 mmol/L
Sodium: 142 mmol/L (ref 134–144)
Total Protein: 7.6 g/dL

## 2017-03-17 LAB — TSH: TSH: 1.82 u[IU]/mL

## 2017-04-29 ENCOUNTER — Encounter (INDEPENDENT_AMBULATORY_CARE_PROVIDER_SITE_OTHER): Payer: Self-pay | Admitting: Physician Assistant

## 2017-05-10 ENCOUNTER — Other Ambulatory Visit: Payer: Self-pay | Admitting: Internal Medicine

## 2017-05-19 ENCOUNTER — Telehealth (INDEPENDENT_AMBULATORY_CARE_PROVIDER_SITE_OTHER): Payer: Self-pay | Admitting: Physician Assistant

## 2017-05-19 NOTE — Telephone Encounter (Signed)
Patient called stated [facility] informed her they did not get a Rx for : lexapro and muscle relaxer  Per patient it was RX at last office visit.  Please follow up with patient at [phone]

## 2017-06-03 ENCOUNTER — Ambulatory Visit (INDEPENDENT_AMBULATORY_CARE_PROVIDER_SITE_OTHER): Payer: Self-pay | Admitting: Physician Assistant

## 2017-06-03 ENCOUNTER — Encounter (INDEPENDENT_AMBULATORY_CARE_PROVIDER_SITE_OTHER): Payer: Self-pay | Admitting: Physician Assistant

## 2017-06-03 VITALS — BP 107/68 | HR 68 | Temp 98.1°F | Wt 132.2 lb

## 2017-06-03 DIAGNOSIS — Z114 Encounter for screening for human immunodeficiency virus [HIV]: Secondary | ICD-10-CM

## 2017-06-03 DIAGNOSIS — G4700 Insomnia, unspecified: Secondary | ICD-10-CM

## 2017-06-03 DIAGNOSIS — R5383 Other fatigue: Secondary | ICD-10-CM

## 2017-06-03 DIAGNOSIS — N951 Menopausal and female climacteric states: Secondary | ICD-10-CM

## 2017-06-03 DIAGNOSIS — M797 Fibromyalgia: Secondary | ICD-10-CM

## 2017-06-03 DIAGNOSIS — F32A Depression, unspecified: Secondary | ICD-10-CM

## 2017-06-03 DIAGNOSIS — F329 Major depressive disorder, single episode, unspecified: Secondary | ICD-10-CM

## 2017-06-03 DIAGNOSIS — F419 Anxiety disorder, unspecified: Secondary | ICD-10-CM

## 2017-06-03 DIAGNOSIS — Z23 Encounter for immunization: Secondary | ICD-10-CM

## 2017-06-03 MED ORDER — METHOCARBAMOL 500 MG PO TABS: 500.0000 mg | ORAL_TABLET | Freq: Two times a day (BID) | ORAL | 3 refills | Status: DC

## 2017-06-03 MED ORDER — ESCITALOPRAM OXALATE 20 MG PO TABS: 20.0000 mg | ORAL_TABLET | Freq: Every day | ORAL | 1 refills | Status: DC

## 2017-06-03 MED ORDER — GABAPENTIN 600 MG PO TABS: 600.0000 mg | ORAL_TABLET | Freq: Two times a day (BID) | ORAL | 3 refills | Status: DC

## 2017-06-03 MED ORDER — CLONAZEPAM 0.5 MG PO TABS: 0.5000 mg | ORAL_TABLET | Freq: Two times a day (BID) | ORAL | 0 refills | Status: DC | PRN

## 2017-06-03 MED ORDER — ACETAMINOPHEN-CODEINE #3 300-30 MG PO TABS: 1.0000 | ORAL_TABLET | Freq: Four times a day (QID) | ORAL | 0 refills | Status: AC | PRN

## 2017-06-03 NOTE — Progress Notes (Signed)
Subjective:  Patient ID: Jordan Zimmerman, female    DOB: [birth year]  Age: 46 y.o. MRN: [id number]  CC: multiple complaints  HPI Jordan Zimmerman is a 46 y.o. female with a PMH of anxiety, fibromyalgia, chronic fatigue, IBS, Migraines presents to discuss new medications for her aforementioned conditions. Says she is feeling better in regards to mood and pain with her current treatment regimen. Says there is still persistent fatigue and insomnia. Does not endorse and other symptoms or complaints.       ROS Review of Systems  Constitutional: Positive for malaise/fatigue. Negative for chills and fever.  Eyes: Negative for blurred vision.  Respiratory: Negative for shortness of breath.   Cardiovascular: Negative for chest pain and palpitations.  Gastrointestinal: Negative for abdominal pain and nausea.  Genitourinary: Negative for dysuria and hematuria.  Musculoskeletal: Negative for joint pain and myalgias.  Skin: Negative for rash.  Neurological: Negative for tingling and headaches.  Psychiatric/Behavioral: Negative for depression. The patient has insomnia. The patient is not nervous/anxious.     Objective:  BP 107/68 (BP Location: Right Arm, Patient Position: Sitting, Cuff Size: Small)   Pulse 68   Temp 98.1 F (36.7 C) (Oral)   Wt 132 lb 3.2 oz (60 kg)   LMP 05/23/2017 (Exact Date)   SpO2 95%   BMI 22.69 kg/m   BP/Weight 06/03/2017 03/16/2017 03/09/2017  Systolic BP 107 124 127  Diastolic BP 68 77 80  Wt. (Lbs) 132.2 128.6 -  BMI 22.69 22.07 -      Physical Exam  Constitutional: She is oriented to person, place, and time.  Well developed, well nourished, NAD, polite  HENT:  Head: Normocephalic and atraumatic.  Poor dentition  Eyes: No scleral icterus.  Neck: Normal range of motion. Neck supple. No thyromegaly present.  Cardiovascular: Normal rate, regular rhythm and normal heart sounds.   Pulmonary/Chest: Effort normal and breath sounds normal.   Musculoskeletal: She exhibits no edema.  Neurological: She is alert and oriented to person, place, and time. No cranial nerve deficit. Coordination normal.  Skin: Skin is warm and dry. No rash noted. No erythema. No pallor.  Psychiatric: She has a normal mood and affect. Her behavior is normal. Thought content normal.  Vitals reviewed.    Assessment & Plan:   1. Fatigue, unspecified type - CBC with Differential - TSH - Comprehensive metabolic panel  2. Anxiety and depression - Declines psychiatry/psychology referral - Refill clonazePAM (KLONOPIN) 0.5 MG tablet; Take 1 tablet (0.5 mg total) by mouth 2 (two) times daily as needed for anxiety.  Dispense: 60 tablet; Refill: 0 - Refill escitalopram (LEXAPRO) 20 MG tablet; Take 1 tablet (20 mg total) by mouth daily.  Dispense: 90 tablet; Refill: 1  3. Insomnia, unspecified type - Will work on anxiety/depression. Continue Lexapro for now.  4. Fibromyalgia - Magnesium level drawn today - Begin acetaminophen-codeine (TYLENOL #3) 300-30 MG tablet; Take 1 tablet by mouth every 6 (six) hours as needed for moderate pain.  Dispense: 120 tablet; Refill: 0 - Refill methocarbamol (ROBAXIN) 500 MG tablet; Take 1 tablet (500 mg total) by mouth 2 (two) times daily.  Dispense: 30 tablet; Refill: 3  5. Perimenopausal vasomotor symptoms - Refill gabapentin (NEURONTIN) 600 MG tablet; Take 1 tablet (600 mg total) by mouth 2 (two) times daily.  Dispense: 60 tablet; Refill: 3  6. Need for Tdap vaccination - Tdap vaccine greater than or equal to 7yo IM  7. Screening for HIV (human immunodeficiency virus) - HIV  antibody   Meds ordered this encounter  Medications  . gabapentin (NEURONTIN) 600 MG tablet    Sig: Take 1 tablet (600 mg total) by mouth 2 (two) times daily.    Dispense:  60 tablet    Refill:  3    Order Specific Question:   Supervising Provider    Answer:   Quentin AngstJEGEDE, OLUGBEMIGA E [id number]  . clonazePAM (KLONOPIN) 0.5 MG tablet    Sig:  Take 1 tablet (0.5 mg total) by mouth 2 (two) times daily as needed for anxiety.    Dispense:  60 tablet    Refill:  0    Order Specific Question:   Supervising Provider    Answer:   Quentin AngstJEGEDE, OLUGBEMIGA E [id number]  . acetaminophen-codeine (TYLENOL #3) 300-30 MG tablet    Sig: Take 1 tablet by mouth every 6 (six) hours as needed for moderate pain.    Dispense:  120 tablet    Refill:  0    Order Specific Question:   Supervising Provider    Answer:   Quentin AngstJEGEDE, OLUGBEMIGA E [id number]]  . escitalopram (LEXAPRO) 20 MG tablet    Sig: Take 1 tablet (20 mg total) by mouth daily.    Dispense:  90 tablet    Refill:  1    Order Specific Question:   Supervising Provider    Answer:   Quentin AngstJEGEDE, OLUGBEMIGA E [id number]  . methocarbamol (ROBAXIN) 500 MG tablet    Sig: Take 1 tablet (500 mg total) by mouth 2 (two) times daily.    Dispense:  30 tablet    Refill:  3    Order Specific Question:   Supervising Provider    Answer:   Quentin AngstJEGEDE, OLUGBEMIGA E [id number]    Follow-up: Return in about 4 weeks (around 07/01/2017) for depression with anxiety.   Loletta Specteroger David Gomez PA

## 2017-06-03 NOTE — Patient Instructions (Signed)
Myofascial Pain Syndrome and Fibromyalgia Myofascial pain syndrome and fibromyalgia are both pain disorders. This pain may be felt mainly in your muscles.  Myofascial pain syndrome: ? Always has trigger points or tender points in the muscle that will cause pain when pressed. The pain may come and go. ? Usually affects your neck, upper back, and shoulder areas. The pain often radiates into your arms and hands.  Fibromyalgia: ? Has muscle pains and tenderness that come and go. ? Is often associated with fatigue and sleep disturbances. ? Has trigger points. ? Tends to be long-lasting (chronic), but is not life-threatening.  Fibromyalgia and myofascial pain are not the same. However, they often occur together. If you have both conditions, each can make the other worse. Both are common and can cause enough pain and fatigue to make day-to-day activities difficult. What are the causes? The exact causes of fibromyalgia and myofascial pain are not known. People with certain gene types may be more likely to develop fibromyalgia. Some factors can be triggers for both conditions, such as:  Spine disorders.  Arthritis.  Severe injury (trauma) and other physical stressors.  Being under a lot of stress.  A medical illness.  What are the signs or symptoms? Fibromyalgia The main symptom of fibromyalgia is widespread pain and tenderness in your muscles. This can vary over time. Pain is sometimes described as stabbing, shooting, or burning. You may have tingling or numbness, too. You may also have sleep problems and fatigue. You may wake up feeling tired and groggy (fibro fog). Other symptoms may include:  Bowel and bladder problems.  Headaches.  Visual problems.  Problems with odors and noises.  Depression or mood changes.  Painful menstrual periods (dysmenorrhea).  Dry skin or eyes.  Myofascial pain syndrome Symptoms of myofascial pain syndrome include:  Tight, ropy bands of  muscle.  Uncomfortable sensations in muscular areas, such as: ? Aching. ? Cramping. ? Burning. ? Numbness. ? Tingling. ? Muscle weakness.  Trouble moving certain muscles freely (range of motion).  How is this diagnosed? There are no specific tests to diagnose fibromyalgia or myofascial pain syndrome. Both can be hard to diagnose because their symptoms are common in many other conditions. Your health care provider may suspect one or both of these conditions based on your symptoms and medical history. Your health care provider will also do a physical exam. The key to diagnosing fibromyalgia is having pain, fatigue, and other symptoms for more than three months that cannot be explained by another condition. The key to diagnosing myofascial pain syndrome is finding trigger points in muscles that are tender and cause pain elsewhere in your body (referred pain). How is this treated? Treating fibromyalgia and myofascial pain often requires a team of health care providers. This usually starts with your primary provider and a physical therapist. You may also find it helpful to work with alternative health care providers, such as massage therapists or acupuncturists. Treatment for fibromyalgia may include medicines. This may include nonsteroidal anti-inflammatory drugs (NSAIDs), along with other medicines. Treatment for myofascial pain may also include:  NSAIDs.  Cooling and stretching of muscles.  Trigger point injections.  Sound wave (ultrasound) treatments to stimulate muscles.  Follow these instructions at home:  Take medicines only as directed by your health care provider.  Exercise as directed by your health care provider or physical therapist.  Try to avoid stressful situations.  Practice relaxation techniques to control your stress. You may want to try: ? Biofeedback. ? Visual   imagery. ? Hypnosis. ? Muscle relaxation. ? Yoga. ? Meditation.  Talk to your health care provider  about alternative treatments, such as acupuncture or massage treatment.  Maintain a healthy lifestyle. This includes eating a healthy diet and getting enough sleep.  Consider joining a support group.  Do not do activities that stress or strain your muscles. That includes repetitive motions and heavy lifting. Where to find more information:  National Fibromyalgia Association: www.fmaware.org  Arthritis Foundation: www.arthritis.org  American Chronic Pain Association: www.theacpa.org/condition/myofascial-pain Contact a health care provider if:  You have new symptoms.  Your symptoms get worse.  You have side effects from your medicines.  You have trouble sleeping.  Your condition is causing depression or anxiety. This information is not intended to replace advice given to you by your health care provider. Make sure you discuss any questions you have with your health care provider. Document Released: 11/15/2005 Document Revised: 04/22/2016 Document Reviewed: 08/21/2014 Elsevier Interactive Patient Education  2018 Elsevier Inc.  

## 2017-06-04 LAB — COMPREHENSIVE METABOLIC PANEL
ALT: 7 IU/L (ref 0–32)
AST: 16 IU/L (ref 0–40)
Albumin/Globulin Ratio: 2 (ref 1.2–2.2)
Albumin: 4.3 g/dL (ref 3.5–5.5)
Alkaline Phosphatase: 82 IU/L (ref 39–117)
BUN/Creatinine Ratio: 18 (ref 9–23)
BUN: 17 mg/dL (ref 6–24)
Bilirubin Total: <0.2 mg/dL (ref 0.0–1.2)
CO2: 24 mmol/L (ref 20–29)
Calcium: 9.6 mg/dL (ref 8.7–10.2)
Chloride: 104 mmol/L (ref 96–106)
Creatinine, Ser: 0.94 mg/dL (ref 0.57–1.00)
GFR calc Af Amer: 86 mL/min/{1.73_m2} (ref >59)
GFR calc non Af Amer: 75 mL/min/{1.73_m2} (ref >59)
Globulin, Total: 2.2 g/dL (ref 1.5–4.5)
Glucose: 66 mg/dL (ref 65–99)
Potassium: 4.2 mmol/L (ref 3.5–5.2)
Sodium: 143 mmol/L (ref 134–144)
Total Protein: 6.5 g/dL (ref 6.0–8.5)

## 2017-06-04 LAB — CBC WITH DIFFERENTIAL/PLATELET
Basophils Absolute: 0 10*3/uL (ref 0.0–0.2)
Basos: 0 %
EOS (ABSOLUTE): 0.2 10*3/uL (ref 0.0–0.4)
Eos: 2 %
Hematocrit: 39.5 % (ref 34.0–46.6)
Hemoglobin: 12.6 g/dL (ref 11.1–15.9)
Immature Grans (Abs): 0 10*3/uL (ref 0.0–0.1)
Immature Granulocytes: 0 %
Lymphocytes Absolute: 2.2 10*3/uL (ref 0.7–3.1)
Lymphs: 26 %
MCH: 31.3 pg (ref 26.6–33.0)
MCHC: 31.9 g/dL (ref 31.5–35.7)
MCV: 98 fL — ABNORMAL HIGH (ref 79–97)
Monocytes Absolute: 0.2 10*3/uL (ref 0.1–0.9)
Monocytes: 3 %
Neutrophils Absolute: 6 10*3/uL (ref 1.4–7.0)
Neutrophils: 69 %
Platelets: 333 10*3/uL (ref 150–379)
RBC: 4.02 x10E6/uL (ref 3.77–5.28)
RDW: 13.5 % (ref 12.3–15.4)
WBC: 8.7 10*3/uL (ref 3.4–10.8)

## 2017-06-04 LAB — MAGNESIUM: Magnesium: 2.2 mg/dL (ref 1.6–2.3)

## 2017-06-04 LAB — HIV ANTIBODY (ROUTINE TESTING W REFLEX): HIV Screen 4th Generation wRfx: NONREACTIVE

## 2017-06-04 LAB — TSH: TSH: 1.44 u[IU]/mL (ref 0.450–4.500)

## 2017-06-06 ENCOUNTER — Telehealth (INDEPENDENT_AMBULATORY_CARE_PROVIDER_SITE_OTHER): Payer: Self-pay

## 2017-06-06 NOTE — Telephone Encounter (Signed)
-----   Message from Loletta Specteroger David Gomez, PA-C sent at 06/06/2017  8:40 AM EDT ----- All labs normal. HIV negative.

## 2017-06-06 NOTE — Telephone Encounter (Signed)
Left a detailed message explaining all lab results to patient, and instructed patient to call the office with any questions she may have. Maryjean Mornempestt S Roberts, CMA

## 2017-06-07 ENCOUNTER — Telehealth (INDEPENDENT_AMBULATORY_CARE_PROVIDER_SITE_OTHER): Payer: Self-pay | Admitting: Physician Assistant

## 2017-06-07 NOTE — Telephone Encounter (Signed)
[facility] left [facility]ic[facility] stated needs an alternative to medication, for patients Rx,  Due to back order, not currently available.  Did not state which medication.  Please [facility] up [facility]. At [phone]

## 2017-06-09 ENCOUNTER — Other Ambulatory Visit (INDEPENDENT_AMBULATORY_CARE_PROVIDER_SITE_OTHER): Payer: Self-pay | Admitting: Physician Assistant

## 2017-06-09 DIAGNOSIS — M62838 Other muscle spasm: Secondary | ICD-10-CM

## 2017-06-09 MED ORDER — CYCLOBENZAPRINE HCL 10 MG PO TABS: 10.0000 mg | ORAL_TABLET | Freq: Every day | ORAL | 2 refills | Status: DC

## 2017-06-09 NOTE — Telephone Encounter (Signed)
I sent out cyclobenzaprine and cancelled Robaxin.

## 2017-06-09 NOTE — Telephone Encounter (Signed)
Medication that needs an alternative is methocarbamol. Jordan Zimmerman, CMA

## 2017-07-04 ENCOUNTER — Ambulatory Visit (INDEPENDENT_AMBULATORY_CARE_PROVIDER_SITE_OTHER): Payer: Medicaid Other | Admitting: Physician Assistant

## 2017-07-13 ENCOUNTER — Encounter (INDEPENDENT_AMBULATORY_CARE_PROVIDER_SITE_OTHER): Payer: Self-pay | Admitting: Physician Assistant

## 2017-07-13 ENCOUNTER — Ambulatory Visit (INDEPENDENT_AMBULATORY_CARE_PROVIDER_SITE_OTHER): Payer: BLUE CROSS/BLUE SHIELD | Admitting: Physician Assistant

## 2017-07-13 VITALS — BP 106/71 | HR 82 | Temp 98.3°F | Wt 131.8 lb

## 2017-07-13 DIAGNOSIS — N951 Menopausal and female climacteric states: Secondary | ICD-10-CM

## 2017-07-13 DIAGNOSIS — R202 Paresthesia of skin: Secondary | ICD-10-CM | POA: Diagnosis not present

## 2017-07-13 DIAGNOSIS — R739 Hyperglycemia, unspecified: Secondary | ICD-10-CM | POA: Diagnosis not present

## 2017-07-13 DIAGNOSIS — M797 Fibromyalgia: Secondary | ICD-10-CM

## 2017-07-13 DIAGNOSIS — M62838 Other muscle spasm: Secondary | ICD-10-CM

## 2017-07-13 DIAGNOSIS — F418 Other specified anxiety disorders: Secondary | ICD-10-CM | POA: Diagnosis not present

## 2017-07-13 DIAGNOSIS — F32A Depression, unspecified: Secondary | ICD-10-CM

## 2017-07-13 DIAGNOSIS — F419 Anxiety disorder, unspecified: Secondary | ICD-10-CM | POA: Diagnosis not present

## 2017-07-13 DIAGNOSIS — F329 Major depressive disorder, single episode, unspecified: Secondary | ICD-10-CM

## 2017-07-13 LAB — POCT GLYCOSYLATED HEMOGLOBIN (HGB A1C): Hemoglobin A1C: 5.3

## 2017-07-13 MED ORDER — ESCITALOPRAM OXALATE 20 MG PO TABS: 20.0000 mg | ORAL_TABLET | Freq: Every day | ORAL | 1 refills | Status: DC

## 2017-07-13 MED ORDER — CLONAZEPAM 0.5 MG PO TABS: 0.5000 mg | ORAL_TABLET | Freq: Two times a day (BID) | ORAL | 0 refills | Status: DC | PRN

## 2017-07-13 MED ORDER — ACETAMINOPHEN-CODEINE #3 300-30 MG PO TABS: 1.0000 | ORAL_TABLET | ORAL | 0 refills | Status: AC | PRN

## 2017-07-13 MED ORDER — CYCLOBENZAPRINE HCL 10 MG PO TABS: 10.0000 mg | ORAL_TABLET | Freq: Every day | ORAL | 2 refills | Status: DC

## 2017-07-13 MED ORDER — GABAPENTIN 600 MG PO TABS: 600.0000 mg | ORAL_TABLET | Freq: Two times a day (BID) | ORAL | 3 refills | Status: DC

## 2017-07-13 NOTE — Patient Instructions (Signed)
Community Resources  Advocacy/Legal Legal Aid [place]:  1-866-219-5262  /  336-272-0148  Family Justice Center:  336-641-7233  [facility] [place] 24-hr Crisis line:  336-273-7273  Women's Resource Center, [place]:  336-275-6090  Court Watch (custody):  336-275-2346  [place] [facility]:   336-279-9299    Baby & Breastfeeding Car Seat Inspection @ Various [place] Fire Depts.- call 336-373-2177  [facility] Lactation  336-832-6860  [facility] 336-878-6712  WIC: 336-641-3663 ([place]);  336-641-7571 ([place])  La Leche League:  1-877-452-5321   Childcare [place] [facility]: 336-369-5097 ([place]) / 336-887-8224 ([place])  - Child Care Resources/ Referrals/ Scholarships  - Head Start/ Early Head Start (call or apply online)  [place] DHHS: [place] Pre-K :  1-800-859-0829 / 336-274-5437   Employment / Job Search [facility] [place]: 336-275-6090 / [address]  [place] [employer] (JobLink): 336-373-5922 ([place]) / 336-882-4141 ([place])  [facility] Community Resource/ Career Center: 336-275-9801 / 336-282-7307  [facility] Job & Career Center: 336-373-3764  DHHS Work First: 336-641-3447 ([place]) / 336-641-3447 ([place])  [facility] [place]:  336-676-5871   Financial Assistance [facility]:  336-553-2657  [employer]: 336-235-0368  [employer] (furniture):  336-370-4002  [place] [facility]: 336-373-4264  Low Income Energy Assistance  336-641-3000   Food Assistance DHHS- SNAP/ Food Stamps: 336-641-4588  WIC: [place]- 336-641-3663 ;  [place] 336-641-7571  Little Green Book- Free Meals  Little Blue Book- Free Food Pantries  During the summer, text "FOOD" to 877877   General Health / Clinics (Adults) Orange Card (for Adults) through [facility]: (336) 895-4900  [facility]:   336-832-8035  [facility]:   336-832-4444  Health Department:  336-641-3245  [facility]:  336-415-3877 / 336-641-2100  Planned [facility] of [place]:   336-373-0678  [facility]:   336-334-4822 x 50251   Housing [place] Housing Coalition:   336-691-9521  [place] [facility]:  336-275-8501  Affordable Housing Managemnt:  336-273-0568   Immigrant/ Refugee Center for [place]ians (UNCG):  336-256-1065  Faith [employer] [facility]:  336-379-0037  [employer]:  336-937-4701  [employer] Services:  336-617-0381  African Services Coalition:  336-574-2677   LGBTQ YouthSAFE  www.youthsafe[place].org  PFLAG  336-541-6754 / info@[place].org  The Trevor Project:  1-866-488-7386   Mental Health/ Substance Use [facility]  336-387-6161  [facility]:  336-832-9700 or 1-800-711-2635  [facility] Circle of Care:  336-271-5888  [facility]:  336-294-1349  [facility]:  336-542-2884  [facility] (walk-ins)  336-676-6840 / [address]  [place]:  800-449-1287  Alcoholics Anonymous:  336-854-4278  Narcotics Anonymous:  800-365-1036  Quit Smoking Hotline:  800-QUIT-NOW (800-784-8669)   Parenting Children's Home Society:  800-632-1400  [place]: Education Center & Support Groups:  336-832-6682  YWCA: 336-273-3461  [employer]CG: Bringing Out the Best:  336-334-3120               Thriving at Three (Hispanic families): 336-256-1066  Healthy Start ([facility]):  336-387-6161 x2288  Parents as Teachers:  336-691-0024  [place] [facility]- Learning Together (Immigrants): 336-369-5001   Poison Control 800-222-1222  Sports & Recreation [employer] Open Doors Application: ymcanwnc.org/join/open-doors-financial-assistance/  City of [place] Recreation Centers: http://www.[place].gov/index.aspx?page=3615   Special Needs Family Support Network:  336-832-6507  Autism Society of [place]:   336-333-0197 x1402 or x1412 /  800-785-1035  TEACCH :  336-334-5773     ARC of [place]:  336-373-1076  Children's Developmental Service Agency (CDSA):  336-334-5601  CC4C (Care Coordination for Children):  336-641-7641   Transportation Medicaid Transportation: 336-641-4848 to apply  [place] Transit Authority: 336-335-6499 (reduced-fare bus ID to Medicaid/ Medicare/ Orange Card)  SCAT Paratransit services: Eligible riders only, call 336-333-6589 for application   Tutoring/Mentoring Black Child Development Institute: 336-230-2138  Big Brothers/ Big Sisters: 336-378-9100 ([place])  336-882-4167 ([place])  ACES through child's school: 336-370-2321  [employer] Achievers: contact your local Y  SHIELD Mentor Program: 336-337-2771    Living With Anxiety After being diagnosed with an anxiety disorder, you may be relieved to know why you have felt or behaved a certain way. It is natural to also feel overwhelmed about the treatment ahead and what it will mean for your life. With care and support, you can manage this condition and recover from it. How to cope with anxiety Dealing with stress Stress is your body's reaction to life changes and events, both good and bad. Stress can last just a few hours or it can be ongoing. Stress can play a major role in anxiety, so it is important to learn both how to cope with stress and how to think about it differently. Talk with your health care provider or a counselor to learn more about stress reduction. He or she may suggest some stress reduction techniques, such as:  Music therapy. This can include creating or listening to music that you enjoy and that inspires you.  Mindfulness-based meditation. This involves being aware of your normal breaths, rather than trying to control your breathing. It can be done while sitting or walking.  Centering prayer. This is a kind of meditation that involves focusing on a word, phrase, or sacred image that is meaningful to you and that brings you peace.  Deep breathing. To do this, expand your stomach  and inhale slowly through your nose. Hold your breath for 3-5 seconds. Then exhale slowly, allowing your stomach muscles to relax.  Self-talk. This is a skill where you identify thought patterns that lead to anxiety reactions and correct those thoughts.  Muscle relaxation. This involves tensing muscles then relaxing them.  Choose a stress reduction technique that fits your lifestyle and personality. Stress reduction techniques take time and practice. Set aside 5-15 minutes a day to do them. Therapists can offer training in these techniques. The training may be covered by some insurance plans. Other things you can do to manage stress include:  Keeping a stress diary. This can help you learn what triggers your stress and ways to control your response.  Thinking about how you respond to certain situations. You may not be able to control everything, but you can control your reaction.  Making time for activities that help you relax, and not feeling guilty about spending your time in this way.  Therapy combined with coping and stress-reduction skills provides the best chance for successful treatment. Medicines Medicines can help ease symptoms. Medicines for anxiety include:  Anti-anxiety drugs.  Antidepressants.  Beta-blockers.  Medicines may be used as the main treatment for anxiety disorder, along with therapy, or if other treatments are not working. Medicines should be prescribed by a health care provider. Relationships Relationships can play a big part in helping you recover. Try to spend more time connecting with trusted friends and family members. Consider going to couples counseling, taking family education classes, or going to family therapy. Therapy can help you and others better understand the condition. How to recognize changes   in your condition Everyone has a different response to treatment for anxiety. Recovery from anxiety happens when symptoms decrease and stop interfering with  your daily activities at home or work. This may mean that you will start to:  Have better concentration and focus.  Sleep better.  Be less irritable.  Have more energy.  Have improved memory.  It is important to recognize when your condition is getting worse. Contact your health care provider if your symptoms interfere with home or work and you do not feel like your condition is improving. Where to find help and support: You can get help and support from these sources:  Self-help groups.  Online and community organizations.  A trusted spiritual leader.  Couples counseling.  Family education classes.  Family therapy.  Follow these instructions at home:  Eat a healthy diet that includes plenty of vegetables, fruits, whole grains, low-fat dairy products, and lean protein. Do not eat a lot of foods that are high in solid fats, added sugars, or salt.  Exercise. Most adults should do the following: ? Exercise for at least 150 minutes each week. The exercise should increase your heart rate and make you sweat (moderate-intensity exercise). ? Strengthening exercises at least twice a week.  Cut down on caffeine, tobacco, alcohol, and other potentially harmful substances.  Get the right amount and quality of sleep. Most adults need 7-9 hours of sleep each night.  Make choices that simplify your life.  Take over-the-counter and prescription medicines only as told by your health care provider.  Avoid caffeine, alcohol, and certain over-the-counter cold medicines. These may make you feel worse. Ask your pharmacist which medicines to avoid.  Keep all follow-up visits as told by your health care provider. This is important. Questions to ask your health care provider  Would I benefit from therapy?  How often should I follow up with a health care provider?  How long do I need to take medicine?  Are there any long-term side effects of my medicine?  Are there any alternatives to  taking medicine? Contact a health care provider if:  You have a hard time staying focused or finishing daily tasks.  You spend many hours a day feeling worried about everyday life.  You become exhausted by worry.  You start to have headaches, feel tense, or have nausea.  You urinate more than normal.  You have diarrhea. Get help right away if:  You have a racing heart and shortness of breath.  You have thoughts of hurting yourself or others. If you ever feel like you may hurt yourself or others, or have thoughts about taking your own life, get help right away. You can go to your nearest emergency department or call:  Your local emergency services (911 in the [place]  A suicide crisis helpline, such as the National Suicide Prevention Lifeline at 1-800-273-8255. This is open 24-hours a day.  Summary  Taking steps to deal with stress can help calm you.  Medicines cannot cure anxiety disorders, but they can help ease symptoms.  Family, friends, and partners can play a big part in helping you recover from an anxiety disorder. This information is not intended to replace advice given to you by your health care provider. Make sure you discuss any questions you have with your health care provider. Document Released: 11/09/2016 Document Revised: 11/09/2016 Document Reviewed: 11/09/2016 Elsevier Interactive Patient Education  2018 Elsevier Inc.  

## 2017-07-13 NOTE — Progress Notes (Signed)
Subjective:  Patient ID: Jordan Zimmerman, female    DOB: Apr 21, 1971  Age: 46 y.o. MRN: [id number]  CC: f/u depression and anxiety. F/u pain    HPI Cleaster Shiffer Furniss Nield is a 46 y.o. female with a PMH of anxiety, fibromyalgia, chronic fatigue, IBS, Migraines presents for f/u of insomnia, anxiety, depression, and fatigue. Says insomnia is better but still has some difficulty initiating and maintaining sleep. Anxiety/depression is also significantly better with Escitalopram and Clonazepam usage. Fibromyalgia is still persistent but somewhat better. Spray form of magnesium has reduced fibromyalgia pain significantly when used on affected area. Does not endorse any other complaints or symptoms.     Outpatient Medications Prior to Visit  Medication Sig Dispense Refill  . clonazePAM (KLONOPIN) 0.5 MG tablet Take 1 tablet (0.5 mg total) by mouth 2 (two) times daily as needed for anxiety. 60 tablet 0  . CRANBERRY PO Take 2 capsules by mouth 2 (two) times daily.    . cyclobenzaprine (FLEXERIL) 10 MG tablet Take 1 tablet (10 mg total) by mouth at bedtime. 30 tablet 2  . escitalopram (LEXAPRO) 20 MG tablet Take 1 tablet (20 mg total) by mouth daily. 90 tablet 1  . gabapentin (NEURONTIN) 600 MG tablet Take 1 tablet (600 mg total) by mouth 2 (two) times daily. 60 tablet 3   No facility-administered medications prior to visit.      ROS Review of Systems  Constitutional: Negative for chills, fever and malaise/fatigue.  Eyes: Negative for blurred vision.  Respiratory: Negative for shortness of breath.   Cardiovascular: Negative for chest pain and palpitations.  Gastrointestinal: Negative for abdominal pain and nausea.  Genitourinary: Negative for dysuria and hematuria.  Musculoskeletal: Negative for joint pain and myalgias.  Skin: Negative for rash.  Neurological: Negative for tingling and headaches.  Psychiatric/Behavioral: Negative for depression. The patient is not nervous/anxious.      Objective:  BP 106/71 (BP Location: Right Arm, Patient Position: Sitting, Cuff Size: Normal)   Pulse 82   Temp 98.3 F (36.8 C) (Oral)   Wt 131 lb 12.8 oz (59.8 kg)   LMP 06/14/2017 (Approximate)   SpO2 96%   BMI 22.62 kg/m   BP/Weight 07/13/2017 06/03/2017 03/16/2017  Systolic BP 106 107 124  Diastolic BP 71 68 77  Wt. (Lbs) 131.8 132.2 128.6  BMI 22.62 22.69 22.07      Physical Exam  Constitutional: She is oriented to person, place, and time.  Well developed, well nourished, NAD, polite  HENT:  Head: Normocephalic and atraumatic.  Eyes: Conjunctivae are normal. No scleral icterus.  Cardiovascular: Normal rate, regular rhythm and normal heart sounds.   Pulmonary/Chest: Effort normal and breath sounds normal.  Musculoskeletal: She exhibits no edema.  Neurological: She is alert and oriented to person, place, and time. No cranial nerve deficit. Coordination normal.  Skin: Skin is warm and dry. No rash noted. No erythema. No pallor.  Psychiatric: She has a normal mood and affect. Her behavior is normal. Thought content normal.  Vitals reviewed.    Assessment & Plan:    1. Depression with anxiety - Refill Escitalopram 20 mg   2. Hyperglycemia - HgB A1c 5.3% in clinic today  3. Anxiety and depression - Refill escitalopram (LEXAPRO) 20 MG tablet; Take 1 tablet (20 mg total) by mouth daily.  Dispense: 90 tablet; Refill: 1 - Refill clonazePAM (KLONOPIN) 0.5 MG tablet; Take 1 tablet (0.5 mg total) by mouth 2 (two) times daily as needed for anxiety.  Dispense:  60 tablet; Refill: 0. Unfortunately I will no longer be able to monitor and manage patient's benzo use due to company policy. Patient is aware that no further refills can be made from me.  - Ambulatory referral to Psychiatry  4. Perimenopausal vasomotor symptoms - Refill gabapentin (NEURONTIN) 600 MG tablet; Take 1 tablet (600 mg total) by mouth 2 (two) times daily.  Dispense: 60 tablet; Refill: 3  5. Muscle  spasm - Refill cyclobenzaprine (FLEXERIL) 10 MG tablet; Take 1 tablet (10 mg total) by mouth at bedtime.  Dispense: 30 tablet; Refill: 2  6. Fibromyalgia - Refill acetaminophen-codeine (TYLENOL #3) 300-30 MG tablet; Take 1 tablet by mouth every 4 (four) hours as needed for moderate pain.  Dispense: 42 tablet; Refill: 0. Pt made aware that I can no longer prescribe narcotic medication for chronic pain due to company policy.  - Ambulatory referral to Pain Clinic - Magnesium level drawn in clinic today  7. Paresthesia - Vitamin B12 level drawn in clinic today.   Meds ordered this encounter  Medications  . escitalopram (LEXAPRO) 20 MG tablet    Sig: Take 1 tablet (20 mg total) by mouth daily.    Dispense:  90 tablet    Refill:  1    Order Specific Question:   Supervising Provider    Answer:   Quentin AngstJEGEDE, OLUGBEMIGA E [id number]]  . gabapentin (NEURONTIN) 600 MG tablet    Sig: Take 1 tablet (600 mg total) by mouth 2 (two) times daily.    Dispense:  60 tablet    Refill:  3    Order Specific Question:   Supervising Provider    Answer:   Quentin AngstJEGEDE, OLUGBEMIGA E [id number]]  . cyclobenzaprine (FLEXERIL) 10 MG tablet    Sig: Take 1 tablet (10 mg total) by mouth at bedtime.    Dispense:  30 tablet    Refill:  2    Cancel methocarbamol order. Thank you.    Order Specific Question:   Supervising Provider    Answer:   Quentin AngstJEGEDE, OLUGBEMIGA E [id number]]  . clonazePAM (KLONOPIN) 0.5 MG tablet    Sig: Take 1 tablet (0.5 mg total) by mouth 2 (two) times daily as needed for anxiety.    Dispense:  60 tablet    Refill:  0    Order Specific Question:   Supervising Provider    Answer:   Quentin AngstJEGEDE, OLUGBEMIGA E [id number]]  . acetaminophen-codeine (TYLENOL #3) 300-30 MG tablet    Sig: Take 1 tablet by mouth every 4 (four) hours as needed for moderate pain.    Dispense:  42 tablet    Refill:  0    Order Specific Question:   Supervising Provider    Answer:   Quentin AngstJEGEDE, OLUGBEMIGA E [id number]]    Follow-up: Return if  symptoms worsen or fail to improve.   Loletta Specteroger David Gomez PA

## 2017-07-14 LAB — MAGNESIUM: Magnesium: 2.4 mg/dL — ABNORMAL HIGH (ref 1.6–2.3)

## 2017-07-14 LAB — VITAMIN B12: Vitamin B-12: >2000 pg/mL — ABNORMAL HIGH (ref 232–1245)

## 2017-07-15 ENCOUNTER — Encounter (INDEPENDENT_AMBULATORY_CARE_PROVIDER_SITE_OTHER): Payer: Self-pay

## 2017-09-19 ENCOUNTER — Ambulatory Visit (HOSPITAL_COMMUNITY)
Admission: EM | Admit: 2017-09-19 | Discharge: 2017-09-19 | Disposition: A | Discharge status: Home / Self Care | Payer: BLUE CROSS/BLUE SHIELD | Attending: Nurse Practitioner | Admitting: Nurse Practitioner

## 2017-09-19 ENCOUNTER — Encounter (HOSPITAL_COMMUNITY): Payer: Self-pay | Admitting: Emergency Medicine

## 2017-09-19 DIAGNOSIS — S0501XA Injury of conjunctiva and corneal abrasion without foreign body, right eye, initial encounter: Secondary | ICD-10-CM | POA: Diagnosis not present

## 2017-09-19 MED ORDER — ERYTHROMYCIN 5 MG/GM OP OINT: TOPICAL_OINTMENT | OPHTHALMIC | 0 refills | Status: DC

## 2017-09-19 NOTE — ED Triage Notes (Signed)
Pt states she was startled by a chicken and it pecked her in the R eye.

## 2017-09-19 NOTE — ED Provider Notes (Signed)
[facility]    CSN: [id number] Arrival date & time: 09/19/17  1644     History   Chief Complaint Chief Complaint  Patient presents with  . Eye Pain    HPI Kolbie Clarkston Furniss Quinnell is a 46 y.o. female.   Subjective:  Jonasia Coiner Furniss Tremain is a 46 y.o. female who presents for evaluation of decreased vision, redness, foreign body sensation, pain, photophobia and watering of the right eye. Acute onset just prior to arrival after being attacked by a chicken. Symptoms have included blurred vision, discharge, foreign body sensation, itching, pain, photophobia and tearing.   The following portions of the patient's history were reviewed and updated as appropriate: allergies, current medications, past family history, past medical history, past social history, past surgical history and problem list.        Past Medical History:  Diagnosis Date  . Anxiety   . Chronic fatigue   . Fibromyalgia   . IBS (irritable bowel syndrome)   . Kidney stones   . Migraines     Patient Active Problem List   Diagnosis Date Noted  . Fibromyalgia 05/21/2013  . Irritable bowel syndrome 05/21/2013  . Dehydration 05/21/2013  . Nausea and vomiting 05/21/2013    Past Surgical History:  Procedure Laterality Date  . CHOLECYSTECTOMY  2001  . CYSTOSCOPY W/ URETEROSCOPY  ~ 2008   "pulled stone rest of the way out" (05/21/2013)  . TONSILLECTOMY  1992    OB History    Gravida Para Term Preterm AB Living   2 2 2          SAB TAB Ectopic Multiple Live Births           2       Home Medications    Prior to Admission medications   Medication Sig Start Date End Date Taking? Authorizing Provider  clonazePAM (KLONOPIN) 0.5 MG tablet Take 1 tablet (0.5 mg total) by mouth 2 (two) times daily as needed for anxiety. 07/13/17  Yes Loletta Specter, PA-C  CRANBERRY PO Take 2 capsules by mouth 2 (two) times daily.   Yes [provider]  cyclobenzaprine (FLEXERIL) 10 MG tablet Take 1  tablet (10 mg total) by mouth at bedtime. 07/13/17  Yes Loletta Specter, PA-C  escitalopram (LEXAPRO) 20 MG tablet Take 1 tablet (20 mg total) by mouth daily. 07/13/17  Yes Loletta Specter, PA-C  gabapentin (NEURONTIN) 600 MG tablet Take 1 tablet (600 mg total) by mouth 2 (two) times daily. 07/13/17  Yes Loletta Specter, PA-C    Family History Family History  Problem Relation Age of Onset  . Diabetes Mother   . Cancer Mother   . Hypertension Mother   . Hypertension Father   . Diabetes Father   . Cancer Father   . Breast cancer Maternal Aunt   . Breast cancer Maternal Grandmother     Social History Social History  Substance Use Topics  . Smoking status: Former Smoker    Packs/day: 1.00    Years: 4.00    Types: Cigarettes    Quit date: 11/29/1994  . Smokeless tobacco: Never Used  . Alcohol use Yes     Comment: 05/21/2013 "mixed drink 3-4-5 X/yr"      Allergies   Ambien [zolpidem tartrate]; Amoxicillin; and Penicillins   Review of Systems Review of Systems  Eyes: Positive for photophobia, pain, discharge, redness and visual disturbance.  All other systems reviewed and are negative.    Physical Exam  Triage Vital Signs ED Triage Vitals  Enc Vitals Group     BP 09/19/17 1653 (!) 126/95     Pulse Rate 09/19/17 1653 (!) 118     Resp 09/19/17 1653 18     Temp 09/19/17 1653 97.9 F (36.6 C)     Temp Source 09/19/17 1653 Oral     SpO2 09/19/17 1653 98 %     Weight 09/19/17 1654 135 lb (61.2 kg)     Height 09/19/17 1654 5\' 5"  (1.651 m)     Head Circumference --      Peak Flow --      Pain Score 09/19/17 1659 10     Pain Loc --      Pain Edu? --      Excl. in GC? --    No data found.   Updated Vital Signs BP (!) 126/95 (BP Location: Right Arm)   Pulse (!) 118   Temp 97.9 F (36.6 C) (Oral)   Resp 18   Ht 5\' 5"  (1.651 m)   Wt 135 lb (61.2 kg)   LMP 08/29/2017   SpO2 98%   BMI 22.47 kg/m   Visual Acuity Right Eye Distance:   Left Eye Distance:     Bilateral Distance:    Right Eye Near:   Left Eye Near:    Bilateral Near:     Physical Exam  Constitutional: She is oriented to person, place, and time. She appears well-developed and well-nourished.  Eyes: Pupils are equal, round, and reactive to light. Conjunctivae and EOM are normal. No scleral icterus.  Positive fluorescein stain. Small corneal abrasion noted to the 9:00 area of the right.  Neck: Normal range of motion.  Cardiovascular: Normal rate and regular rhythm.   Pulmonary/Chest: Effort normal and breath sounds normal.  Musculoskeletal: Normal range of motion.  Neurological: She is alert and oriented to person, place, and time.  Skin: Skin is warm and dry.  Psychiatric:  slightly anxious      UC Treatments / Results  Labs (all labs ordered are listed, but only abnormal results are displayed) Labs Reviewed - No data to display  EKG  EKG Interpretation None       Radiology No results found.  Procedures Procedures (including critical care time)  Medications Ordered in UC Medications - No data to display   Initial Impression / Assessment and Plan / UC Course  I have reviewed the triage vital signs and the nursing notes.  Pertinent labs & imaging results that were available during my care of the patient were reviewed by me and considered in my medical decision making (see chart for details).     46 y.o. female who presents for evaluation of decreased vision, redness, foreign body sensation, pain, photophobia and watering of the right eye after being attacked by a chicken. No globe injury noted. Small corneal abrasion noted on fluorescin eye stain. Will provide Rx for erythromycin and ophthalmology referral.   Discussed diagnosis and treatment with patient. All questions have been answered and all concerns have been addressed. The patient verbalized understanding and had no further questions   Final Clinical Impressions(s) / UC Diagnoses   Final  diagnoses:  Abrasion of right cornea, initial encounter    New Prescriptions New Prescriptions   No medications on file     Controlled Substance Prescriptions [place] Controlled Substance Registry consulted? Not Applicable   Lurline IdolMurrill, Matthe Sloane, [place]NP 09/19/17 1735

## 2017-09-23 ENCOUNTER — Telehealth (INDEPENDENT_AMBULATORY_CARE_PROVIDER_SITE_OTHER): Payer: Self-pay | Admitting: Physician Assistant

## 2017-09-23 NOTE — Telephone Encounter (Signed)
Patient called requesting  A rx clonazePAM (KLONOPIN) 0.5 MG tablet  Because she won't see the specialist  Until 11-09-17  Thank You

## 2017-09-26 NOTE — Telephone Encounter (Signed)
I have already made patient aware that I won't be prescribing long term controlled substances at her last visit in August. She understood it was clinic policy. No refill of controlled substances.

## 2017-09-26 NOTE — Telephone Encounter (Signed)
FWD to PCP. Mekhi Sonn S Darielle Hancher, CMA  

## 2017-09-30 ENCOUNTER — Ambulatory Visit (HOSPITAL_COMMUNITY)
Admission: EM | Admit: 2017-09-30 | Discharge: 2017-09-30 | Disposition: A | Discharge status: Home / Self Care | Payer: BLUE CROSS/BLUE SHIELD | Attending: Internal Medicine | Admitting: Internal Medicine

## 2017-09-30 ENCOUNTER — Ambulatory Visit (INDEPENDENT_AMBULATORY_CARE_PROVIDER_SITE_OTHER): Payer: BLUE CROSS/BLUE SHIELD

## 2017-09-30 ENCOUNTER — Encounter (HOSPITAL_COMMUNITY): Payer: Self-pay | Admitting: Emergency Medicine

## 2017-09-30 DIAGNOSIS — R197 Diarrhea, unspecified: Secondary | ICD-10-CM | POA: Diagnosis not present

## 2017-09-30 DIAGNOSIS — Z3202 Encounter for pregnancy test, result negative: Secondary | ICD-10-CM | POA: Diagnosis not present

## 2017-09-30 DIAGNOSIS — R310 Gross hematuria: Secondary | ICD-10-CM

## 2017-09-30 DIAGNOSIS — R1030 Lower abdominal pain, unspecified: Secondary | ICD-10-CM | POA: Diagnosis not present

## 2017-09-30 DIAGNOSIS — Z87891 Personal history of nicotine dependence: Secondary | ICD-10-CM | POA: Diagnosis not present

## 2017-09-30 DIAGNOSIS — R1084 Generalized abdominal pain: Secondary | ICD-10-CM

## 2017-09-30 DIAGNOSIS — F419 Anxiety disorder, unspecified: Secondary | ICD-10-CM | POA: Diagnosis not present

## 2017-09-30 DIAGNOSIS — R112 Nausea with vomiting, unspecified: Secondary | ICD-10-CM

## 2017-09-30 DIAGNOSIS — Z880 Allergy status to penicillin: Secondary | ICD-10-CM | POA: Insufficient documentation

## 2017-09-30 DIAGNOSIS — M797 Fibromyalgia: Secondary | ICD-10-CM | POA: Insufficient documentation

## 2017-09-30 DIAGNOSIS — K580 Irritable bowel syndrome with diarrhea: Secondary | ICD-10-CM | POA: Insufficient documentation

## 2017-09-30 LAB — POCT I-STAT, CHEM 8
BUN: 8 mg/dL (ref 6–20)
Calcium, Ion: 1.15 mmol/L (ref 1.15–1.40)
Chloride: 107 mmol/L (ref 101–111)
Creatinine, Ser: 0.7 mg/dL (ref 0.44–1.00)
Glucose, Bld: 99 mg/dL (ref 65–99)
HCT: 43 % (ref 36.0–46.0)
Hemoglobin: 14.6 g/dL (ref 12.0–15.0)
Potassium: 3.5 mmol/L (ref 3.5–5.1)
Sodium: 143 mmol/L (ref 135–145)
TCO2: 24 mmol/L (ref 22–32)

## 2017-09-30 LAB — POCT URINALYSIS DIP (DEVICE)
Bilirubin Urine: NEGATIVE
Glucose, UA: NEGATIVE mg/dL
Ketones, ur: 40 mg/dL — AB
Nitrite: NEGATIVE
Protein, ur: >=300 mg/dL — AB
Specific Gravity, Urine: 1.01 (ref 1.005–1.030)
Urobilinogen, UA: 0.2 mg/dL (ref 0.0–1.0)
pH: 6 (ref 5.0–8.0)

## 2017-09-30 LAB — POCT PREGNANCY, URINE: Preg Test, Ur: NEGATIVE

## 2017-09-30 MED ORDER — ONDANSETRON HCL 4 MG PO TABS: 4.0000 mg | ORAL_TABLET | Freq: Four times a day (QID) | ORAL | 0 refills | Status: DC

## 2017-09-30 MED ORDER — SULFAMETHOXAZOLE-TRIMETHOPRIM 800-160 MG PO TABS: 1.0000 | ORAL_TABLET | Freq: Two times a day (BID) | ORAL | 0 refills | Status: AC

## 2017-09-30 MED ORDER — ONDANSETRON 4 MG PO TBDP
ORAL_TABLET | ORAL | Status: AC
  Filled 2017-09-30: qty 1

## 2017-09-30 MED ORDER — ONDANSETRON 4 MG PO TBDP
4.0000 mg | ORAL_TABLET | Freq: Once | ORAL | Status: AC
  Administered 2017-09-30: 4 mg via ORAL

## 2017-09-30 NOTE — Discharge Instructions (Signed)
No alarming signs on exam. Kidney stones in your abdominal xray. Your kidney function is normal. Start bactrim as directed. Zofran for nausea/vomiting as needed. Drink plenty of fluid. If symptoms worsens, nausea/vomiting despite medication, fevers, go to the emergency department for further evaluation.

## 2017-09-30 NOTE — ED Provider Notes (Signed)
[facility]    CSN: [id number] Arrival date & time: 09/30/17  1527     History   Chief Complaint Chief Complaint  Patient presents with  . Abdominal Pain    HPI Jordan Zimmerman is a 46 y.o. female.   46 year old female with history of fibromyalgia, IBS, kidney stones comes in for 2 day history of low abdominal pain/back pain. She has also had nausea with 2 episodes of vomiting this morning. Body aches and diarrhea. She states diarrhea is similar to her IBS flares. She has also noticed hematuria, denies urinary frequency/dysuria. Patient states low back pain is mostly on the right side with radiation to the low abdomen, which is similar to what she experienced during last kidney stones. She states she has a constant low abdominal pain that is dull, and gets intermittent sharp pains. Denies association with food, movement, urination. LMP 09/21/2017 and confirmed that cycle cleared prior to onset of hematuria. She denies URI symptoms such as cough, congestion sore throat. Denies fever, chills, night sweats.       Past Medical History:  Diagnosis Date  . Anxiety   . Chronic fatigue   . Fibromyalgia   . IBS (irritable bowel syndrome)   . Kidney stones   . Migraines     Patient Active Problem List   Diagnosis Date Noted  . Fibromyalgia 05/21/2013  . Irritable bowel syndrome 05/21/2013  . Dehydration 05/21/2013  . Nausea and vomiting 05/21/2013    Past Surgical History:  Procedure Laterality Date  . CHOLECYSTECTOMY  2001  . CYSTOSCOPY W/ URETEROSCOPY  ~ 2008   "pulled stone rest of the way out" (05/21/2013)  . TONSILLECTOMY  1992    OB History    Gravida Para Term Preterm AB Living   2 2 2          SAB TAB Ectopic Multiple Live Births           2       Home Medications    Prior to Admission medications   Medication Sig Start Date End Date Taking? Authorizing Provider  escitalopram (LEXAPRO) 20 MG tablet Take 1 tablet (20 mg total) by mouth daily.  07/13/17  Yes Loletta Specter, PA-C  gabapentin (NEURONTIN) 600 MG tablet Take 1 tablet (600 mg total) by mouth 2 (two) times daily. 07/13/17  Yes Loletta Specter, PA-C  ondansetron (ZOFRAN) 4 MG tablet Take 1 tablet (4 mg total) by mouth every 6 (six) hours. 09/30/17   Cathie Hoops, Amy V, PA-C  sulfamethoxazole-trimethoprim (BACTRIM DS,SEPTRA DS) 800-160 MG tablet Take 1 tablet by mouth 2 (two) times daily. 09/30/17 10/03/17  Belinda Fisher, PA-C    Family History Family History  Problem Relation Age of Onset  . Diabetes Mother   . Cancer Mother   . Hypertension Mother   . Hypertension Father   . Diabetes Father   . Cancer Father   . Breast cancer Maternal Aunt   . Breast cancer Maternal Grandmother     Social History Social History  Substance Use Topics  . Smoking status: Former Smoker    Packs/day: 1.00    Years: 4.00    Types: Cigarettes    Quit date: 11/29/1994  . Smokeless tobacco: Never Used  . Alcohol use Yes     Comment: 05/21/2013 "mixed drink 3-4-5 X/yr"      Allergies   Ambien [zolpidem tartrate]; Amoxicillin; and Penicillins   Review of Systems Review of Systems  Reason  unable to perform ROS: See HPI as above.     Physical Exam Triage Vital Signs ED Triage Vitals  Enc Vitals Group     BP 09/30/17 1550 122/82     Pulse Rate 09/30/17 1550 91     Resp 09/30/17 1550 20     Temp 09/30/17 1550 98.9 F (37.2 C)     Temp Source 09/30/17 1550 Oral     SpO2 09/30/17 1550 99 %     Weight --      Height --      Head Circumference --      Peak Flow --      Pain Score 09/30/17 1551 7     Pain Loc --      Pain Edu? --      Excl. in GC? --    No data found.   Updated Vital Signs BP 122/82 (BP Location: Left Arm)   Pulse 91   Temp 98.9 F (37.2 C) (Oral)   Resp 20   LMP 09/21/2017   SpO2 99%   Physical Exam  Constitutional: She is oriented to person, place, and time. She appears well-developed and well-nourished. No distress.  HENT:  Head: Normocephalic and  atraumatic.  Eyes: Pupils are equal, round, and reactive to light. Conjunctivae are normal.  Cardiovascular: Normal rate, regular rhythm and normal heart sounds.  Exam reveals no gallop and no friction rub.   No murmur heard. Pulmonary/Chest: Effort normal and breath sounds normal. She has no wheezes. She has no rales.  Abdominal: Soft. Bowel sounds are normal.  Generalized tenderness on palpation without guarding/rebound.   Tenderness on palpation of right flank/back, doubt true CVA tenderness.   Neurological: She is alert and oriented to person, place, and time.  Skin: Skin is warm and dry.  Psychiatric: She has a normal mood and affect. Her behavior is normal. Judgment normal.     UC Treatments / Results  Labs (all labs ordered are listed, but only abnormal results are displayed) Labs Reviewed  POCT URINALYSIS DIP (DEVICE) - Abnormal; Notable for the following:       Result Value   Ketones, ur 40 (*)    Hgb urine dipstick LARGE (*)    Protein, ur >=300 (*)    Leukocytes, UA TRACE (*)    All other components within normal limits  URINE CULTURE  POCT PREGNANCY, URINE  POCT I-STAT, CHEM 8    EKG  EKG Interpretation None       Radiology Dg Abd Acute W/chest  Result Date: 09/30/2017 CLINICAL DATA:  46 y/o F; abdominal pain, nausea, vomiting, back pain. EXAM: DG ABDOMEN ACUTE W/ 1V CHEST COMPARISON:  10/14/2007 CT abdomen and pelvis. 12/04/2009 chest radiograph. FINDINGS: Normal bowel gas pattern. Right upper quadrant cholecystectomy clips. Multiple stones project over the right kidney measuring up to 15 mm. Clear lungs. Normal cardiac silhouette. Bones are unremarkable. IMPRESSION: 1. Clear lungs. 2. Normal bowel gas pattern. 3. Right kidney stones. Electronically Signed   By: Mitzi HansenLance  Furusawa-Stratton M.D.   On: 09/30/2017 17:30    Procedures Procedures (including critical care time)  Medications Ordered in UC Medications  ondansetron (ZOFRAN-ODT) disintegrating tablet  4 mg (4 mg Oral Given 09/30/17 1642)     Initial Impression / Assessment and Plan / UC Course  I have reviewed the triage vital signs and the nursing notes.  Pertinent labs & imaging results that were available during my care of the patient were reviewed by me and considered in  my medical decision making (see chart for details).    KUB showed kidney stones with no obvious ureteral/urtethral stones. istat showed normal renal function. Given trace leukocytes will send for culture and will cover with bactrim. Zofran for nausea/vomiting. Push fluids. Urology information provided. Strict return precautions given.   Final Clinical Impressions(s) / UC Diagnoses   Final diagnoses:  Generalized abdominal pain  Gross hematuria    New Prescriptions Discharge Medication List as of 09/30/2017  5:58 PM    START taking these medications   Details  ondansetron (ZOFRAN) 4 MG tablet Take 1 tablet (4 mg total) by mouth every 6 (six) hours., Starting Fri 09/30/2017, Normal    sulfamethoxazole-trimethoprim (BACTRIM DS,SEPTRA DS) 800-160 MG tablet Take 1 tablet by mouth 2 (two) times daily., Starting Fri 09/30/2017, Until Mon 10/03/2017, Normal          Linward Headland V, PA-C 09/30/17 1826

## 2017-09-30 NOTE — ED Triage Notes (Signed)
Pt here for abd/back pain onset 2 days associated w/nauseas, BA, diarrhea, hematuria  A&O x4... NAD... Ambulatory

## 2017-10-01 ENCOUNTER — Encounter (HOSPITAL_COMMUNITY): Payer: Self-pay | Admitting: Emergency Medicine

## 2017-10-01 ENCOUNTER — Emergency Department (HOSPITAL_COMMUNITY): Payer: BLUE CROSS/BLUE SHIELD

## 2017-10-01 ENCOUNTER — Emergency Department (HOSPITAL_COMMUNITY)
Admission: EM | Admit: 2017-10-01 | Discharge: 2017-10-01 | Disposition: A | Discharge status: Home / Self Care | Payer: BLUE CROSS/BLUE SHIELD | Attending: Emergency Medicine | Admitting: Emergency Medicine

## 2017-10-01 DIAGNOSIS — Z79899 Other long term (current) drug therapy: Secondary | ICD-10-CM | POA: Insufficient documentation

## 2017-10-01 DIAGNOSIS — Z87891 Personal history of nicotine dependence: Secondary | ICD-10-CM | POA: Diagnosis not present

## 2017-10-01 DIAGNOSIS — R1011 Right upper quadrant pain: Secondary | ICD-10-CM | POA: Diagnosis present

## 2017-10-01 DIAGNOSIS — N201 Calculus of ureter: Secondary | ICD-10-CM | POA: Diagnosis not present

## 2017-10-01 DIAGNOSIS — N23 Unspecified renal colic: Secondary | ICD-10-CM | POA: Diagnosis not present

## 2017-10-01 LAB — CBC WITH DIFFERENTIAL/PLATELET
Basophils Absolute: 0 10*3/uL (ref 0.0–0.1)
Basophils Relative: 0 %
Eosinophils Absolute: 0 10*3/uL (ref 0.0–0.7)
Eosinophils Relative: 0 %
HCT: 38.8 % (ref 36.0–46.0)
Hemoglobin: 13 g/dL (ref 12.0–15.0)
Lymphocytes Relative: 16 %
Lymphs Abs: 1.6 10*3/uL (ref 0.7–4.0)
MCH: 31.6 pg (ref 26.0–34.0)
MCHC: 33.5 g/dL (ref 30.0–36.0)
MCV: 94.4 fL (ref 78.0–100.0)
Monocytes Absolute: 0.4 10*3/uL (ref 0.1–1.0)
Monocytes Relative: 4 %
Neutro Abs: 7.7 10*3/uL (ref 1.7–7.7)
Neutrophils Relative %: 80 %
Platelets: 320 10*3/uL (ref 150–400)
RBC: 4.11 MIL/uL (ref 3.87–5.11)
RDW: 12.8 % (ref 11.5–15.5)
WBC: 9.8 10*3/uL (ref 4.0–10.5)

## 2017-10-01 LAB — COMPREHENSIVE METABOLIC PANEL
ALT: 9 U/L — ABNORMAL LOW (ref 14–54)
AST: 23 U/L (ref 15–41)
Albumin: 4.5 g/dL (ref 3.5–5.0)
Alkaline Phosphatase: 68 U/L (ref 38–126)
Anion gap: 12 (ref 5–15)
BUN: 10 mg/dL (ref 6–20)
CO2: 21 mmol/L — ABNORMAL LOW (ref 22–32)
Calcium: 9.5 mg/dL (ref 8.9–10.3)
Chloride: 107 mmol/L (ref 101–111)
Creatinine, Ser: 0.88 mg/dL (ref 0.44–1.00)
GFR calc Af Amer: >60 mL/min (ref >60)
GFR calc non Af Amer: >60 mL/min (ref >60)
Glucose, Bld: 137 mg/dL — ABNORMAL HIGH (ref 65–99)
Potassium: 3.2 mmol/L — ABNORMAL LOW (ref 3.5–5.1)
Sodium: 140 mmol/L (ref 135–145)
Total Bilirubin: 0.7 mg/dL (ref 0.3–1.2)
Total Protein: 7 g/dL (ref 6.5–8.1)

## 2017-10-01 LAB — URINE CULTURE: Culture: NO GROWTH

## 2017-10-01 LAB — LIPASE, BLOOD: Lipase: 31 U/L (ref 11–51)

## 2017-10-01 MED ORDER — METOCLOPRAMIDE HCL 5 MG/ML IJ SOLN
10.0000 mg | Freq: Once | INTRAMUSCULAR | Status: AC
  Administered 2017-10-01: 10 mg via INTRAVENOUS
  Filled 2017-10-01: qty 2

## 2017-10-01 MED ORDER — DIPHENHYDRAMINE HCL 50 MG/ML IJ SOLN
25.0000 mg | Freq: Once | INTRAMUSCULAR | Status: AC
Start: 2017-10-01 — End: 2017-10-01
  Administered 2017-10-01: 25 mg via INTRAVENOUS
  Filled 2017-10-01: qty 1

## 2017-10-01 MED ORDER — PROCHLORPERAZINE MALEATE 10 MG PO TABS: 10.0000 mg | ORAL_TABLET | Freq: Four times a day (QID) | ORAL | 0 refills | Status: DC | PRN

## 2017-10-01 MED ORDER — PROCHLORPERAZINE 25 MG RE SUPP: 25.0000 mg | Freq: Two times a day (BID) | RECTAL | 0 refills | Status: DC | PRN

## 2017-10-01 MED ORDER — OXYCODONE-ACETAMINOPHEN 5-325 MG PO TABS: 1.0000 | ORAL_TABLET | ORAL | 0 refills | Status: DC | PRN

## 2017-10-01 MED ORDER — PROCHLORPERAZINE EDISYLATE 5 MG/ML IJ SOLN
10.0000 mg | Freq: Once | INTRAMUSCULAR | Status: AC
  Administered 2017-10-01: 10 mg via INTRAVENOUS
  Filled 2017-10-01: qty 2

## 2017-10-01 MED ORDER — ONDANSETRON HCL 4 MG/2ML IJ SOLN
4.0000 mg | Freq: Once | INTRAMUSCULAR | Status: AC
  Administered 2017-10-01: 4 mg via INTRAVENOUS
  Filled 2017-10-01: qty 2

## 2017-10-01 MED ORDER — TAMSULOSIN HCL 0.4 MG PO CAPS: 0.4000 mg | ORAL_CAPSULE | Freq: Every day | ORAL | Status: DC

## 2017-10-01 MED ORDER — SODIUM CHLORIDE 0.9 % IV BOLUS (SEPSIS)
1000.0000 mL | Freq: Once | INTRAVENOUS | Status: AC
  Administered 2017-10-01: 1000 mL via INTRAVENOUS

## 2017-10-01 MED ORDER — MORPHINE SULFATE (PF) 4 MG/ML IV SOLN
4.0000 mg | INTRAVENOUS | Status: DC | PRN
  Administered 2017-10-01 (×2): 4 mg via INTRAVENOUS
  Filled 2017-10-01 (×2): qty 1

## 2017-10-01 NOTE — ED Notes (Signed)
Taken to ct at this time. 

## 2017-10-01 NOTE — ED Provider Notes (Signed)
[facility] EMERGENCY DEPARTMENT Provider Note   CSN: [id number] Arrival date & time: 10/01/17  0125     History   Chief Complaint Chief Complaint  Patient presents with  . Nephrolithiasis  . Emesis    HPI Jordan Zimmerman is a 46 y.o. female.  The history is provided by the patient.  She complains of pain in the right flank for the last 3 days, much worse tonight.  She had gone to urgent care where they found blood in her urine and gave her prescriptions for ondansetron and trimethoprim-sulfamethoxazole.  The pain became severe tonight and she rates it at 9/10.  There is associated nausea and vomiting.  She denies fever, but has had chills and sweats.  She denies dysuria or urinary urgency or frequency.  She also states that she has history of fibromyalgia and irritable bowel syndrome and that the symptoms seem to be worsening with her pain getting worse.  Past Medical History:  Diagnosis Date  . Anxiety   . Chronic fatigue   . Fibromyalgia   . IBS (irritable bowel syndrome)   . Kidney stones   . Migraines     Patient Active Problem List   Diagnosis Date Noted  . Fibromyalgia 05/21/2013  . Irritable bowel syndrome 05/21/2013  . Dehydration 05/21/2013  . Nausea and vomiting 05/21/2013    Past Surgical History:  Procedure Laterality Date  . CHOLECYSTECTOMY  2001  . CYSTOSCOPY W/ URETEROSCOPY  ~ 2008   "pulled stone rest of the way out" (05/21/2013)  . TONSILLECTOMY  1992    OB History    Gravida Para Term Preterm AB Living   2 2 2          SAB TAB Ectopic Multiple Live Births           2       Home Medications    Prior to Admission medications   Medication Sig Start Date End Date Taking? Authorizing Provider  cyclobenzaprine (FLEXERIL) 10 MG tablet Take 10 mg by mouth 3 (three) times daily as needed for muscle spasms.   Yes [provider]  escitalopram (LEXAPRO) 20 MG tablet Take 1 tablet (20 mg total) by mouth daily. 07/13/17   Yes Loletta Specter, PA-C  gabapentin (NEURONTIN) 600 MG tablet Take 1 tablet (600 mg total) by mouth 2 (two) times daily. 07/13/17  Yes Loletta Specter, PA-C  ondansetron (ZOFRAN) 4 MG tablet Take 1 tablet (4 mg total) by mouth every 6 (six) hours. 09/30/17  Yes Yu, Amy V, PA-C  sulfamethoxazole-trimethoprim (BACTRIM DS,SEPTRA DS) 800-160 MG tablet Take 1 tablet by mouth 2 (two) times daily. 09/30/17 10/03/17  Belinda Fisher, PA-C    Family History Family History  Problem Relation Age of Onset  . Diabetes Mother   . Cancer Mother   . Hypertension Mother   . Hypertension Father   . Diabetes Father   . Cancer Father   . Breast cancer Maternal Aunt   . Breast cancer Maternal Grandmother     Social History Social History  Substance Use Topics  . Smoking status: Former Smoker    Packs/day: 1.00    Years: 4.00    Types: Cigarettes    Quit date: 11/29/1994  . Smokeless tobacco: Never Used  . Alcohol use Yes     Comment: 05/21/2013 "mixed drink 3-4-5 X/yr"      Allergies   Ambien [zolpidem tartrate]; Amoxicillin; and Penicillins   Review of Systems Review  of Systems  All other systems reviewed and are negative.    Physical Exam Updated Vital Signs BP 111/74   Pulse 65   Temp 98.2 F (36.8 C) (Oral)   Resp 18   LMP 09/21/2017   SpO2 100%   Physical Exam  Nursing note and vitals reviewed.  Uncomfortable appearing 46 year old female, resting comfortably and in no acute distress. Vital signs are normal. Oxygen saturation is 100%, which is normal. Head is normocephalic and atraumatic. PERRLA, EOMI. Oropharynx is clear. Neck is nontender and supple without adenopathy or JVD. Back is nontender in the midline.  There is mild to moderate right CVA tenderness. Lungs are clear without rales, wheezes, or rhonchi. Chest is nontender. Heart has regular rate and rhythm without murmur. Abdomen is soft, flat, with moderate right lower quadrant tenderness and mild tenderness  throughout the rest of the abdomen.  There are no masses or hepatosplenomegaly and peristalsis is hypoactive. Extremities have no cyanosis or edema, full range of motion is present. Skin is warm and dry without rash. Neurologic: Mental status is normal, cranial nerves are intact, there are no motor or sensory deficits.  ED Treatments / Results  Labs (all labs ordered are listed, but only abnormal results are displayed) Labs Reviewed  COMPREHENSIVE METABOLIC PANEL - Abnormal; Notable for the following:       Result Value   Potassium 3.2 (*)    CO2 21 (*)    Glucose, Bld 137 (*)    ALT 9 (*)    All other components within normal limits  LIPASE, BLOOD  CBC WITH DIFFERENTIAL/PLATELET   Radiology Dg Abd Acute W/chest  Result Date: 09/30/2017 CLINICAL DATA:  46 y/o F; abdominal pain, nausea, vomiting, back pain. EXAM: DG ABDOMEN ACUTE W/ 1V CHEST COMPARISON:  10/14/2007 CT abdomen and pelvis. 12/04/2009 chest radiograph. FINDINGS: Normal bowel gas pattern. Right upper quadrant cholecystectomy clips. Multiple stones project over the right kidney measuring up to 15 mm. Clear lungs. Normal cardiac silhouette. Bones are unremarkable. IMPRESSION: 1. Clear lungs. 2. Normal bowel gas pattern. 3. Right kidney stones. Electronically Signed   By: Mitzi HansenLance  Furusawa-Stratton M.D.   On: 09/30/2017 17:30    Procedures Procedures (including critical care time)  Medications Ordered in ED Medications  sodium chloride 0.9 % bolus 1,000 mL (not administered)  ondansetron (ZOFRAN) injection 4 mg (not administered)  morphine 4 MG/ML injection 4 mg (not administered)     Initial Impression / Assessment and Plan / ED Course  I have reviewed the triage vital signs and the nursing notes.  Pertinent labs & imaging results that were available during my care of the patient were reviewed by me and considered in my medical decision making (see chart for details).  Right flank pain consistent with ureteral colic.   Old records are reviewed, and she does have prior ED visit for kidney stones.  Also, had urgent care visit yesterday with hematuria being identified.  She had been discharged from urgent care with prescriptions for ondansetron and trimethoprim-sulfamethoxazole.  She is given IV fluids, ondansetron, and morphine in the ED, and is being sent for renal stone protocol CT scan.  CT shows 10 mm calculus in the right renal pelvis with hydronephrosis.  This seems unlikely to be able to pass.  She did get reasonable relief of pain with morphine, but did not get relief of nausea with ondansetron.  She is given a dose of metoclopramide with diphenhydramine.  She did not get adequate relief of  nausea with metoclopramide, so she is given a dose of prochlorperazine.  Following this, she had good relief of nausea.  Because of suspicion that she would need lithotripsy, I contacted Dr. Al Corpus from [facility] urology.  He will contact the office to try to set her up for lithotripsy in 2 days.  Patient is discharged with prescriptions for oxycodone-acetaminophen, tamsulosin, prochlorperazine tablets and prochlorperazine suppositories.  Return precautions discussed.  Advised that if she does need to return, to return to [facility] emergency, because of urology services available at that hospital.  Final Clinical Impressions(s) / ED Diagnoses   Final diagnoses:  Ureterolithiasis  Ureteral colic    New Prescriptions New Prescriptions   OXYCODONE-ACETAMINOPHEN (PERCOCET) 5-325 MG TABLET    Take 1-2 tablets by mouth every 4 (four) hours as needed for moderate pain.   PROCHLORPERAZINE (COMPAZINE) 10 MG TABLET    Take 1 tablet (10 mg total) by mouth every 6 (six) hours as needed for nausea or vomiting.   PROCHLORPERAZINE (COMPAZINE) 25 MG SUPPOSITORY    Place 1 suppository (25 mg total) rectally every 12 (twelve) hours as needed for nausea or vomiting.   TAMSULOSIN (FLOMAX) 0.4 MG CAPS CAPSULE    Take 1 capsule (0.4 mg  total) by mouth daily.     Dione Booze, MD 10/01/17 [phone]

## 2017-10-01 NOTE — ED Notes (Signed)
Back from CT

## 2017-10-01 NOTE — Discharge Instructions (Signed)
If pain is not being adequately controlled, or if you start running a fever, then [facility].  If you do not hear from [facility], then call them early Monday morning to see if you are scheduled for lithotripsy. Do not eat or drink anything after midnight Sunday night (except for pain/nausea medications with a sip of water). Do not take any aspirin, ibuprofen or naproxen.

## 2017-10-01 NOTE — ED Triage Notes (Signed)
C/o R flank pain, nausea, and vomiting since Monday.  [facility] and diagnosed with kidney stones.  Took Zofran x 3 without relief of nausea/vomiting.

## 2017-10-03 ENCOUNTER — Other Ambulatory Visit: Payer: Self-pay | Admitting: Urology

## 2017-10-04 ENCOUNTER — Encounter (HOSPITAL_COMMUNITY): Payer: Self-pay | Admitting: *Deleted

## 2017-10-04 NOTE — H&P (Signed)
CC: I have kidney stones.  HPI: Jordan Zimmerman is a 46 year-old female patient who was referred by Sindy Messingoger Gomez, PA who is here for renal calculi.  The problem is on the right side. This is not her first kidney stone. Her first stone was approximately 09/30/2007. She has had 1 stones prior to getting this one. She is currently having flank pain and nausea. She denies having back pain, groin pain, vomiting, fever, and chills. She has not caught a stone in her urine strainer since her symptoms began.   She has had ureteroscopy for treatment of her stones in the past.   [facility]ed [facility] on 10/01/17 with right flank pain. No fevers or chills. Pain quickly controlled. U/A with trace LE, negative nitrites. WBC 9.8, Cr 0.88. pregnancy test negative. No toradol given. AF,VSS   CT revealed right 10mm renal pelvis stone with mild hydronephrosis ( ball valving likely) and 13mm lower pole stone. No ureteral dilation or stone. No left hydronephrosis. No left sided stone.   Currently, her right flank pain has markedly improved, but she still having intermittent episodes of nausea without vomiting. She denies fever/chills, dysuria or hematuria.     ALLERGIES: Ambien TABS Amoxicillin CAPS Penicillins    MEDICATIONS: Ciprofloxacin Hcl 500 mg tablet 0 Oral  Daily Vitamin tablet Oral  Gabapentin  Hydromorphone Hcl 8 mg tablet Oral  Ondansetron Odt 8 mg tablet,disintegrating Oral     GU PSH: Ureteroscopic stone removal - 2008      PSH Notes: Cystoscopy With Ureteroscopy With Removal Of Calculus, Cholecystectomy, Tonsillectomy   NON-GU PSH: Cholecystectomy (open) - 2008 Remove Tonsils - 2008    GU PMH: Chronic cystitis (w/o hematuria), Chronic cystitis - 2014 History of urolithiasis, Nephrolithiasis - 2014 Renal calculus, Bilateral kidney stones - 2014 Ureteral calculus, Calculus of ureter - 2014      PMH Notes:  1898-11-29 00:00:00 - Note: Normal Routine History And Physical Adult   NON-GU PMH:  Anxiety, Anxiety - 2014 Fibromyalgia, Fibromyalgia - 2014 Type 2 diabetes mellitus with hyperglycemia, Diabetes Mellitus Poorly Controlled - 2014    FAMILY HISTORY: Alzheimer's Disease - Grandfather Diabetes - Grandmother, Grandfather, Grandmother, Grandfather Essential Hypertension - Grandmother, Grandfather, Grandmother, Grandfather Family Health Status Number - Runs In Family nephrolithiasis - Brother, Father   SOCIAL HISTORY: None    Notes: Occupation:, Alcohol Use, Caffeine Use, Tobacco Use, Marital History - Currently Married   REVIEW OF SYSTEMS:    GU Review Female:   Patient denies frequent urination, hard to postpone urination, burning /pain with urination, get up at night to urinate, leakage of urine, stream starts and stops, trouble starting your stream, have to strain to urinate, and being pregnant.  Gastrointestinal (Upper):   Patient denies nausea, vomiting, and indigestion/ heartburn.  Gastrointestinal (Lower):   Patient denies diarrhea and constipation.  Constitutional:   Patient denies fever, night sweats, weight loss, and fatigue.  Skin:   Patient denies skin rash/ lesion and itching.  Eyes:   Patient denies blurred vision and double vision.  Ears/ Nose/ Throat:   Patient denies sore throat and sinus problems.  Hematologic/Lymphatic:   Patient denies swollen glands and easy bruising.  Cardiovascular:   Patient denies leg swelling and chest pains.  Respiratory:   Patient denies cough and shortness of breath.  Endocrine:   Patient denies excessive thirst.  Musculoskeletal:   Patient denies back pain and joint pain.  Neurological:   Patient denies headaches and dizziness.  Psychologic:   Patient denies  depression and anxiety.   VITAL SIGNS:      10/03/2017 10:08 AM  Weight 140 lb / 63.5 kg  BP 131/76 mmHg  Pulse 80 /min  Temperature 99.0 F / 37.2 C   MULTI-SYSTEM PHYSICAL EXAMINATION:    Constitutional: Well-nourished. No physical deformities. Normally developed.  Good grooming.  Neck: Neck symmetrical, not swollen. Normal tracheal position.  Respiratory: No labored breathing, no use of accessory muscles.   Cardiovascular: Normal temperature, normal extremity pulses, no swelling, no varicosities.  Lymphatic: No enlargement of neck, axillae, groin.  Skin: No paleness, no jaundice, no cyanosis. No lesion, no ulcer, no rash.  Neurologic / Psychiatric: Oriented to time, oriented to place, oriented to person. No depression, no anxiety, no agitation.  Gastrointestinal: No mass, no tenderness, no rigidity, non obese abdomen.  Eyes: Normal conjunctivae. Normal eyelids.  Ears, Nose, Mouth, and Throat: Left ear no scars, no lesions, no masses. Right ear no scars, no lesions, no masses. Nose no scars, no lesions, no masses. Normal hearing. Normal lips.  Musculoskeletal: Normal gait and station of head and neck.     PAST DATA REVIEWED:  Source Of History:  Patient   PROCEDURES:         KUB - [id number]  A single view of the abdomen is obtained.      A 1 cm calcification is seen at the right UPJ and a 1.3 cm calculus is seen involving the right lower pole (consistent with findings on CT on 10/01/17). No other bony abnormalities noted. No abnormal bowel gas patterns or signs of free air.         Urinalysis w/Scope Dipstick Dipstick Cont'd Micro  Color: Amber Bilirubin: Neg WBC/hpf: 0 - 5/hpf  Appearance: Cloudy Ketones: 1+ RBC/hpf: >60/hpf  Specific Gravity: 1.020 Blood: 3+ Bacteria: Rare (0-9/hpf)  pH: 5.5 Protein: 3+ Cystals: NS (Not Seen)  Glucose: Neg Urobilinogen: 0.2 Casts: NS (Not Seen)    Nitrites: Neg Trichomonas: Not Present    Leukocyte Esterase: Neg Mucous: Not Present      Epithelial Cells: 0 - 5/hpf      Yeast: NS (Not Seen)      Sperm: Not Present    ASSESSMENT:      ICD-10 Details  1 GU:   Renal calculus - N20.0 1 cm right UPJ an 1.3 cm right lower pole renal stone  2   History of urolithiasis - Z87.442   3   Renal calculus - N20.0   4    Renal and ureteral calculus - N20.2    PLAN:           Orders X-Rays: KUB          Schedule Return Visit/Planned Activity: ASAP - Schedule Surgery          Document Letter(s):  Created for Patient: Clinical Summary         Notes:   We discussed the management of kidney stones. These options include observation, ureteroscopy, shockwave lithotripsy, and PCNL. We discussed which options are relevant to the patient's stone(s). We discussed the natural history of stones as well as the complications of untreated stones and the impact on quality of life without treatment as well as with each of the above listed treatments. We also discussed the efficacy of each treatment in its ability to clear the stone burden. With any of these management options I discussed the signs and symptoms of infection and the need for emergent treatment should these be experienced. For each option  we discussed the ability of each procedure to clear the patient of their stone burden.  For observation I described the risks which include but are not limited to silent renal damage, life-threatening infection, need for emergent surgery, failure to pass stone, and pain.  For ureteroscopy I described the risks which include heart attack, stroke, pulmonary embolus, death, bleeding, infection, damage to contiguous structures, positioning injury, ureteral stricture, ureteral avulsion, ureteral injury, need for ureteral stent, inability to perform ureteroscopy, need for an interval procedure, inability to clear stone burden, stent discomfort and pain.  For shockwave lithotripsy I described the risks which include arrhythmia, kidney contusion, kidney hemorrhage, need for transfusion, pain, inability to break up stone, inability to pass stone fragments, Steinstrasse, infection associated with obstructing stones, need for different surgical procedure, need for repeat shockwave lithotripsy, and death.  For PCNL I described the risks  including positioning injury, pneumothorax, hydrothorax, need for chest tube, inability to clear stone burden, renal laceration, arterial venous fistula or malformation, need for embolization of kidney, loss of kidney or renal function, need for repeat procedure, need for prolonged nephrostomy tube, ureteral avulsion and inherent risks of general anesthesia.   -KUB today  -Will proceed with right ESWL, specifically of her UPJ stone. Depending on what is seen on fluoroscopy during her ESWL will dictate whether or not we can treat her right renal stone. She voices understanding that she may require multiple surgeries to clear her of stones.

## 2017-10-06 ENCOUNTER — Encounter (HOSPITAL_COMMUNITY): Payer: Self-pay | Admitting: *Deleted

## 2017-10-06 ENCOUNTER — Other Ambulatory Visit: Payer: Self-pay

## 2017-10-06 ENCOUNTER — Encounter (HOSPITAL_COMMUNITY)
Admission: RE | Disposition: A | Discharge status: Home / Self Care | Payer: Self-pay | Source: Ambulatory Visit | Attending: Urology

## 2017-10-06 ENCOUNTER — Ambulatory Visit (HOSPITAL_COMMUNITY): Payer: BLUE CROSS/BLUE SHIELD

## 2017-10-06 ENCOUNTER — Ambulatory Visit (HOSPITAL_COMMUNITY)
Admission: RE | Admit: 2017-10-06 | Discharge: 2017-10-06 | Disposition: A | Discharge status: Home / Self Care | Payer: BLUE CROSS/BLUE SHIELD | Source: Ambulatory Visit | Attending: Urology | Admitting: Urology

## 2017-10-06 DIAGNOSIS — N202 Calculus of kidney with calculus of ureter: Secondary | ICD-10-CM | POA: Insufficient documentation

## 2017-10-06 DIAGNOSIS — Z87442 Personal history of urinary calculi: Secondary | ICD-10-CM | POA: Diagnosis not present

## 2017-10-06 DIAGNOSIS — Z880 Allergy status to penicillin: Secondary | ICD-10-CM | POA: Diagnosis not present

## 2017-10-06 DIAGNOSIS — N200 Calculus of kidney: Secondary | ICD-10-CM | POA: Diagnosis present

## 2017-10-06 DIAGNOSIS — Z79899 Other long term (current) drug therapy: Secondary | ICD-10-CM | POA: Insufficient documentation

## 2017-10-06 HISTORY — PX: EXTRACORPOREAL SHOCK WAVE LITHOTRIPSY: SHX1557

## 2017-10-06 HISTORY — DX: Personal history of urinary calculi: Z87.442

## 2017-10-06 SURGERY — LITHOTRIPSY, ESWL
Anesthesia: LOCAL | Laterality: Right

## 2017-10-06 MED ORDER — PROCHLORPERAZINE MALEATE 10 MG PO TABS: 10.0000 mg | ORAL_TABLET | Freq: Four times a day (QID) | ORAL | 0 refills | Status: DC | PRN

## 2017-10-06 MED ORDER — OXYCODONE-ACETAMINOPHEN 5-325 MG PO TABS: 1.0000 | ORAL_TABLET | ORAL | 0 refills | Status: DC | PRN

## 2017-10-06 MED ORDER — ACETAMINOPHEN 325 MG PO TABS
650.0000 mg | ORAL_TABLET | ORAL | Status: DC | PRN
  Administered 2017-10-06: 650 mg via ORAL
  Filled 2017-10-06: qty 2

## 2017-10-06 MED ORDER — SODIUM CHLORIDE 0.9% FLUSH: 3.0000 mL | INTRAVENOUS | Status: DC | PRN

## 2017-10-06 MED ORDER — SODIUM CHLORIDE 0.9 % IV SOLN: 250.0000 mL | INTRAVENOUS | Status: DC | PRN

## 2017-10-06 MED ORDER — DIPHENHYDRAMINE HCL 25 MG PO CAPS
25.0000 mg | ORAL_CAPSULE | ORAL | Status: AC
  Administered 2017-10-06: 25 mg via ORAL
  Filled 2017-10-06: qty 1

## 2017-10-06 MED ORDER — OXYCODONE HCL 5 MG PO TABS: 5.0000 mg | ORAL_TABLET | ORAL | Status: DC | PRN

## 2017-10-06 MED ORDER — DIAZEPAM 5 MG PO TABS
10.0000 mg | ORAL_TABLET | ORAL | Status: AC
  Administered 2017-10-06: 10 mg via ORAL
  Filled 2017-10-06: qty 2

## 2017-10-06 MED ORDER — CIPROFLOXACIN HCL 500 MG PO TABS
500.0000 mg | ORAL_TABLET | ORAL | Status: AC
  Administered 2017-10-06: 500 mg via ORAL
  Filled 2017-10-06: qty 1

## 2017-10-06 MED ORDER — MORPHINE SULFATE (PF) 4 MG/ML IV SOLN: 2.0000 mg | INTRAVENOUS | Status: DC | PRN

## 2017-10-06 MED ORDER — SODIUM CHLORIDE 0.9% FLUSH: 3.0000 mL | Freq: Two times a day (BID) | INTRAVENOUS | Status: DC

## 2017-10-06 MED ORDER — ACETAMINOPHEN 650 MG RE SUPP
650.0000 mg | RECTAL | Status: DC | PRN
  Filled 2017-10-06: qty 1

## 2017-10-06 MED ORDER — SODIUM CHLORIDE 0.9 % IV SOLN
INTRAVENOUS | Status: DC
  Administered 2017-10-06: 08:00:00 via INTRAVENOUS

## 2017-10-06 NOTE — Interval H&P Note (Signed)
History and Physical Interval Note:  10/06/2017 8:50 AM  Jordan Zimmerman  has presented today for surgery, with the diagnosis of RIGHT URETEROPELVIC JUNCTION STONE, RIGHT RENAL STONES  The various methods of treatment have been discussed with the patient and family. After consideration of risks, benefits and other options for treatment, the patient has consented to  Procedure(s): RIGHT EXTRACORPOREAL SHOCK WAVE LITHOTRIPSY (ESWL) (Right) as a surgical intervention .  The patient's history has been reviewed, patient examined, no change in status, stable for surgery.  I have reviewed the patient's chart and labs.  Questions were answered to the patient's satisfaction.     Nguyen Butler J

## 2017-10-06 NOTE — Discharge Instructions (Signed)
Lithotripsy, Care After °This sheet gives you information about how to care for yourself after your procedure. Your health care provider may also give you more specific instructions. If you have problems or questions, contact your health care provider. °What can I expect after the procedure? °After the procedure, it is common to have: °· Some blood in your urine. This should only last for a few days. °· Soreness in your back, sides, or upper abdomen for a few days. °· Blotches or bruises on your back where the pressure wave entered the skin. °· Pain, discomfort, or nausea when pieces (fragments) of the kidney stone move through the tube that carries urine from the kidney to the bladder (ureter). Stone fragments may pass soon after the procedure, but they may continue to pass for up to 4-8 weeks. °? If you have severe pain or nausea, contact your health care provider. This may be caused by a large stone that was not broken up, and this may mean that you need more treatment. °· Some pain or discomfort during urination. °· Some pain or discomfort in the lower abdomen or (in men) at the base of the penis. ° °Follow these instructions at home: °Medicines °· Take over-the-counter and prescription medicines only as told by your health care provider. °· If you were prescribed an antibiotic medicine, take it as told by your health care provider. Do not stop taking the antibiotic even if you start to feel better. °· Do not drive for 24 hours if you were given a medicine to help you relax (sedative). °· Do not drive or use heavy machinery while taking prescription pain medicine. °Eating and drinking °· Drink enough water and fluids to keep your urine clear or pale yellow. This helps any remaining pieces of the stone to pass. It can also help prevent new stones from forming. °· Eat plenty of fresh fruits and vegetables. °· Follow instructions from your health care provider about eating and drinking restrictions. You may be  instructed: °? To reduce how much salt (sodium) you eat or drink. Check ingredients and nutrition facts on packaged foods and beverages. °? To reduce how much meat you eat. °· Eat the recommended amount of calcium for your age and gender. Ask your health care provider how much calcium you should have. °General instructions °· Get plenty of rest. °· Most people can resume normal activities 1-2 days after the procedure. Ask your health care provider what activities are safe for you. °· If directed, strain all urine through the strainer that was provided by your health care provider. °? Keep all fragments for your health care provider to see. Any stones that are found may be sent to a medical lab for examination. The stone may be as small as a grain of salt. °· Keep all follow-up visits as told by your health care provider. This is important. °Contact a health care provider if: °· You have pain that is severe or does not get better with medicine. °· You have nausea that is severe or does not go away. °· You have blood in your urine longer than your health care provider told you to expect. °· You have more blood in your urine. °· You have pain during urination that does not go away. °· You urinate more frequently than usual and this does not go away. °· You develop a rash or any other possible signs of an allergic reaction. °Get help right away if: °· You have severe pain in   your back, sides, or upper abdomen. °· You have severe pain while urinating. °· Your urine is very dark red. °· You have blood in your stool (feces). °· You cannot pass any urine at all. °· You feel a strong urge to urinate after emptying your bladder. °· You have a fever or chills. °· You develop shortness of breath, difficulty breathing, or chest pain. °· You have severe nausea that leads to persistent vomiting. °· You faint. °Summary °· After this procedure, it is common to have some pain, discomfort, or nausea when pieces (fragments) of the  kidney stone move through the tube that carries urine from the kidney to the bladder (ureter). If this pain or nausea is severe, however, you should contact your health care provider. °· Most people can resume normal activities 1-2 days after the procedure. Ask your health care provider what activities are safe for you. °· Drink enough water and fluids to keep your urine clear or pale yellow. This helps any remaining pieces of the stone to pass, and it can help prevent new stones from forming. °· If directed, strain your urine and keep all fragments for your health care provider to see. Fragments or stones may be as small as a grain of salt. °· Get help right away if you have severe pain in your back, sides, or upper abdomen or have severe pain while urinating. °This information is not intended to replace advice given to you by your health care provider. Make sure you discuss any questions you have with your health care provider. °Document Released: 12/05/2007 Document Revised: 10/06/2016 Document Reviewed: 10/06/2016 °[employer] Interactive Patient Education © 2017 [employer] ° °

## 2017-11-09 ENCOUNTER — Ambulatory Visit (INDEPENDENT_AMBULATORY_CARE_PROVIDER_SITE_OTHER): Payer: BLUE CROSS/BLUE SHIELD | Admitting: Psychiatry

## 2017-11-09 ENCOUNTER — Encounter (HOSPITAL_COMMUNITY): Payer: Self-pay | Admitting: Psychiatry

## 2017-11-09 VITALS — BP 140/78 | HR 90 | Ht 65.0 in | Wt 135.8 lb

## 2017-11-09 DIAGNOSIS — R450 Nervousness: Secondary | ICD-10-CM | POA: Diagnosis not present

## 2017-11-09 DIAGNOSIS — F4310 Post-traumatic stress disorder, unspecified: Secondary | ICD-10-CM | POA: Diagnosis not present

## 2017-11-09 DIAGNOSIS — Z87891 Personal history of nicotine dependence: Secondary | ICD-10-CM | POA: Diagnosis not present

## 2017-11-09 DIAGNOSIS — K589 Irritable bowel syndrome without diarrhea: Secondary | ICD-10-CM | POA: Diagnosis not present

## 2017-11-09 DIAGNOSIS — G4700 Insomnia, unspecified: Secondary | ICD-10-CM

## 2017-11-09 DIAGNOSIS — M797 Fibromyalgia: Secondary | ICD-10-CM | POA: Diagnosis not present

## 2017-11-09 DIAGNOSIS — F4010 Social phobia, unspecified: Secondary | ICD-10-CM

## 2017-11-09 DIAGNOSIS — F419 Anxiety disorder, unspecified: Secondary | ICD-10-CM

## 2017-11-09 MED ORDER — PRAZOSIN HCL 2 MG PO CAPS: 2.0000 mg | ORAL_CAPSULE | Freq: Every day | ORAL | 1 refills | Status: DC

## 2017-11-09 MED ORDER — GABAPENTIN 800 MG PO TABS: 800.0000 mg | ORAL_TABLET | Freq: Three times a day (TID) | ORAL | 3 refills | Status: DC

## 2017-11-09 MED ORDER — ESCITALOPRAM OXALATE 20 MG PO TABS: 20.0000 mg | ORAL_TABLET | Freq: Every day | ORAL | 1 refills | Status: DC

## 2017-11-09 MED ORDER — MIRTAZAPINE 15 MG PO TABS: 15.0000 mg | ORAL_TABLET | Freq: Every day | ORAL | 1 refills | Status: DC
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
## 2017-11-09 NOTE — Patient Instructions (Addendum)
Prazosin at bedtime - right when you get in bed  Remeron about 2 hours before bedtime - if it makes you too tired to where you forget the prazosin, then go ahead and take the prazosin with the remeron about 2 hours before bedtime  STOP Flexeril - okay to use 1/2 tablet for a week then stop  No St'Johns wort  Increase Gabapentin to 800 mg 3 times a day - okay to go slow at first and just do the 800 mg twice a day to get used to it  TWO referals for therapy: [facility] - both are good, start whichever one is closer to you and feels more comfortable. (not both)   [facility]  [address] [place], [place] [id number] Phone [phone]) [phone]; Fax [phone]

## 2017-11-11 ENCOUNTER — Ambulatory Visit (INDEPENDENT_AMBULATORY_CARE_PROVIDER_SITE_OTHER): Payer: BLUE CROSS/BLUE SHIELD | Admitting: Psychology

## 2017-11-11 DIAGNOSIS — F4310 Post-traumatic stress disorder, unspecified: Secondary | ICD-10-CM

## 2017-11-18 ENCOUNTER — Ambulatory Visit: Payer: Self-pay | Admitting: Psychology

## 2017-11-25 ENCOUNTER — Telehealth: Payer: Self-pay | Admitting: *Deleted

## 2017-11-25 NOTE — Telephone Encounter (Signed)
Copied from CRM [id number]. Topic: Quick Communication - Office Called Patient >> Nov 25, 2017  9:10 AM Guinevere FerrariMorris, Sharam[place] E, NT wrote: Reason for CRM: Pt called back but no CRM noted.

## 2017-11-30 ENCOUNTER — Ambulatory Visit (INDEPENDENT_AMBULATORY_CARE_PROVIDER_SITE_OTHER): Payer: Commercial Managed Care - PPO | Admitting: Psychology

## 2017-11-30 DIAGNOSIS — F4310 Post-traumatic stress disorder, unspecified: Secondary | ICD-10-CM

## 2017-12-07 ENCOUNTER — Ambulatory Visit (INDEPENDENT_AMBULATORY_CARE_PROVIDER_SITE_OTHER): Payer: Commercial Managed Care - PPO | Admitting: Psychology

## 2017-12-07 DIAGNOSIS — F4310 Post-traumatic stress disorder, unspecified: Secondary | ICD-10-CM | POA: Diagnosis not present

## 2017-12-13 ENCOUNTER — Ambulatory Visit (INDEPENDENT_AMBULATORY_CARE_PROVIDER_SITE_OTHER): Payer: Commercial Managed Care - PPO | Admitting: Psychology

## 2017-12-13 DIAGNOSIS — F4310 Post-traumatic stress disorder, unspecified: Secondary | ICD-10-CM

## 2017-12-16 ENCOUNTER — Telehealth (INDEPENDENT_AMBULATORY_CARE_PROVIDER_SITE_OTHER): Payer: Self-pay | Admitting: Physician Assistant

## 2017-12-16 NOTE — Telephone Encounter (Signed)
Dr Tollie EthPlummer office call to inform that Pt has an appt with him on 01/21/18 at 3pm and Pt has been informed of the appt

## 2017-12-29 ENCOUNTER — Ambulatory Visit: Payer: Commercial Managed Care - PPO | Admitting: Psychology

## 2018-01-10 ENCOUNTER — Ambulatory Visit (INDEPENDENT_AMBULATORY_CARE_PROVIDER_SITE_OTHER): Payer: Commercial Managed Care - PPO | Admitting: Psychology

## 2018-01-10 DIAGNOSIS — F4310 Post-traumatic stress disorder, unspecified: Secondary | ICD-10-CM | POA: Diagnosis not present

## 2018-01-24 ENCOUNTER — Ambulatory Visit: Payer: Commercial Managed Care - PPO | Admitting: Psychology

## 2018-01-25 ENCOUNTER — Ambulatory Visit (INDEPENDENT_AMBULATORY_CARE_PROVIDER_SITE_OTHER): Payer: Commercial Managed Care - PPO

## 2018-01-25 ENCOUNTER — Encounter (HOSPITAL_COMMUNITY): Payer: Self-pay | Admitting: Emergency Medicine

## 2018-01-25 ENCOUNTER — Ambulatory Visit (HOSPITAL_COMMUNITY)
Admission: EM | Admit: 2018-01-25 | Discharge: 2018-01-25 | Disposition: A | Discharge status: Home / Self Care | Payer: Commercial Managed Care - PPO | Attending: Family Medicine | Admitting: Family Medicine

## 2018-01-25 DIAGNOSIS — S8264XA Nondisplaced fracture of lateral malleolus of right fibula, initial encounter for closed fracture: Secondary | ICD-10-CM | POA: Diagnosis not present

## 2018-01-25 DIAGNOSIS — S8991XA Unspecified injury of right lower leg, initial encounter: Secondary | ICD-10-CM | POA: Diagnosis not present

## 2018-01-25 DIAGNOSIS — W19XXXA Unspecified fall, initial encounter: Secondary | ICD-10-CM

## 2018-01-25 DIAGNOSIS — M25571 Pain in right ankle and joints of right foot: Secondary | ICD-10-CM | POA: Diagnosis not present

## 2018-01-25 DIAGNOSIS — S8992XA Unspecified injury of left lower leg, initial encounter: Secondary | ICD-10-CM

## 2018-01-25 DIAGNOSIS — S8262XA Displaced fracture of lateral malleolus of left fibula, initial encounter for closed fracture: Secondary | ICD-10-CM

## 2018-01-25 DIAGNOSIS — M79661 Pain in right lower leg: Secondary | ICD-10-CM | POA: Diagnosis not present

## 2018-01-25 DIAGNOSIS — S8264XD Nondisplaced fracture of lateral malleolus of right fibula, subsequent encounter for closed fracture with routine healing: Secondary | ICD-10-CM | POA: Diagnosis not present

## 2018-01-25 MED ORDER — HYDROCODONE-ACETAMINOPHEN 5-325 MG PO TABS: 1.0000 | ORAL_TABLET | Freq: Four times a day (QID) | ORAL | 0 refills | Status: DC | PRN

## 2018-01-25 NOTE — Discharge Instructions (Signed)
Please rest, ice and elevate the affected extremity. You may take Motrin 600mg  every 8 hours, as needed for pain (take with food). Follow up with orthopaedic surgery within one week for further evaluation. Please call for any appointment. Wear walking boots when standing or walking. Please return here if you are experiencing increased pain, tingling/numbness, swelling, redness, or fever.  Be aware, pain medications may cause drowsiness. Please do not drive, operate heavy machinery or make important decisions while on this medication, it can cloud your judgement.

## 2018-01-25 NOTE — ED Notes (Signed)
Patient in bathroom

## 2018-01-25 NOTE — ED Triage Notes (Signed)
PT reports she fell a week ago and injured both ankles. Fall was the result of "blacking out" PT reports she stood up too quickly in the morning.  PT has only been mobile to use restroom for last week.

## 2018-01-26 DIAGNOSIS — M25571 Pain in right ankle and joints of right foot: Secondary | ICD-10-CM | POA: Insufficient documentation

## 2018-01-26 DIAGNOSIS — S8262XA Displaced fracture of lateral malleolus of left fibula, initial encounter for closed fracture: Secondary | ICD-10-CM | POA: Diagnosis not present

## 2018-01-28 NOTE — ED Provider Notes (Signed)
[facility]   [id number] 01/25/18 Arrival Time: 1431  ASSESSMENT & PLAN:  1. Injury of left lower extremity, initial encounter   2. Closed nondisplaced fracture of lateral malleolus of right fibula, initial encounter   3. Closed displaced fracture of lateral malleolus of left fibula, initial encounter   4. Injury of lower extremity, right, initial encounter    Imaging: Dg Tibia/fibula Left  Result Date: 01/25/2018 CLINICAL DATA:  Left lower leg pain following a fall 1 week ago. EXAM: LEFT TIBIA AND FIBULA - 2 VIEW COMPARISON:  None. FINDINGS: Diffuse distal and lateral soft tissue swelling. Oblique fracture of the lateral malleolus with mild posterior displacement of the distal fragment and mild overlapping of the fragments. No significant angulation. No other fractures are seen. IMPRESSION: Mildly displaced lateral malleolus fracture. Electronically Signed   By: Jordan SaltsSteven  Zimmerman M.D.   On: 01/25/2018 17:19   Dg Tibia/fibula Right  Result Date: 01/25/2018 CLINICAL DATA:  Right lower leg pain following a fall 1 week ago. EXAM: RIGHT TIBIA AND FIBULA - 2 VIEW COMPARISON:  None. FINDINGS: Essentially nondisplaced oblique fracture in the distal fibula above the lateral malleolus. No other fractures and no dislocation. Diffuse distal soft tissue swelling. IMPRESSION: Essentially nondisplaced oblique fracture of the distal fibula. Electronically Signed   By: Jordan SaltsSteven  Zimmerman M.D.   On: 01/25/2018 17:04   Dg Ankle Complete Left  Result Date: 01/25/2018 CLINICAL DATA:  Pain left ankle EXAM: LEFT ANKLE COMPLETE - 3+ VIEW COMPARISON:  None. FINDINGS: Acute fracture involving the distal fibular metaphysis with fracture lucency extending to the superolateral ankle joint. 1/4 shaft diameter of lateral and posterior displacement of distal fracture fragment. Mortise grossly symmetric. Soft tissue swelling is present. IMPRESSION: Acute, mildly displaced distal fibular fracture Electronically Signed   By:  Jordan PangKim  Zimmerman M.D.   On: 01/25/2018 17:22   Dg Ankle Complete Right  Result Date: 01/25/2018 CLINICAL DATA:  Right ankle pain and swelling following a fall 1 week ago. EXAM: RIGHT ANKLE - COMPLETE 3+ VIEW COMPARISON:  Right lower leg radiographs obtained today. FINDINGS: Diffuse soft tissue swelling. Previously described essentially nondisplaced oblique fracture of the distal fibula. This extends into the lateral malleolus and is mildly comminuted on the current images. No additional fractures are seen. Mild to moderate posterior calcaneal spur formation. IMPRESSION: Mildly comminuted, essentially nondisplaced fracture of the distal fibula, including the lateral malleolus. Electronically Signed   By: Jordan SaltsSteven  Zimmerman M.D.   On: 01/25/2018 17:11   Meds ordered this encounter  Medications  . HYDROcodone-acetaminophen (NORCO/VICODIN) 5-325 MG tablet    Sig: Take 1 tablet by mouth every 6 (six) hours as needed for moderate pain or severe pain.    Dispense:  15 tablet    Refill:  0   Jordan Zimmerman consulted for this patient. I feel the risk/benefit ratio today is favorable for proceeding with this prescription for a controlled substance. Medication sedation precautions given.  Bilateral walking boots. OTC analgesics as needed. Written information on fracture and splint care given. To arrange orthopaedic follow up within one week. May f/u here as needed.  Reviewed expectations re: course of current medical issues. Questions answered. Outlined signs and symptoms indicating need for more acute intervention. Patient verbalized understanding. After Visit Summary given.  SUBJECTIVE: History from: patient. Jordan Zimmerman is a 47 y.o. female who reports persistent pain of her bilateral ankles with swelling. Onset abrupt beginning 1 week ago. Injury/trama: yes, reports falling on her porch "in  an odd way". Apparently feet were caught in something and she thinks both ankles twisted.  Discomfort described as aching and throbbing without radiation. Extremity sensation changes or weakness: none. Self treatment: tried OTCs without relief of pain. She is having trouble bearing weight secondary to swelling and pain.  ROS: As per HPI. All other systems negative.   OBJECTIVE:  Vitals:   01/25/18 1521 01/25/18 1523  BP:  111/65  Pulse:  67  Resp:  16  Temp:  98.4 F (36.9 C)  TempSrc:  Oral  SpO2:  98%  Weight: 160 lb (72.6 kg)     General appearance: alert; no distress HEENT: no signs of trauma Extremities: tenderness over her bilateral ankles diffusely; both ankles with significant swelling and mild lateral bruising; both ankles very tender laterally, poorly localized; bilateral ROM: limited by pain and swelling CV: normal extremity capillary refill Lungs: unlabored breathing Skin: warm and dry Neurologic: normal gait; normal symmetric reflexes in all extremities; normal sensation in all extremities Psychological: alert and cooperative; normal mood and affect   Allergies  Allergen Reactions  . Ambien [Zolpidem Tartrate] Anaphylaxis  . Amoxicillin Hives and Swelling    Fever.   . Penicillins Hives    High fever    Past Medical History:  Diagnosis Date  . Anxiety   . Chronic fatigue   . Fibromyalgia   . History of kidney stones   . IBS (irritable bowel syndrome)   . Kidney stones   . Migraines    Social History   Socioeconomic History  . Marital status: Married    Spouse name: Not on file  . Number of children: Not on file  . Years of education: Not on file  . Highest education level: Not on file  Social Needs  . Financial resource strain: Not on file  . Food insecurity - worry: Not on file  . Food insecurity - inability: Not on file  . Transportation needs - medical: Not on file  . Transportation needs - non-medical: Not on file  Occupational History  . Not on file  Tobacco Use  . Smoking status: Former Smoker    Packs/day: 1.00    Years:  4.00    Pack years: 4.00    Types: Cigarettes    Last attempt to quit: 11/29/1994    Years since quitting: 23.1  . Smokeless tobacco: Never Used  Substance and Sexual Activity  . Alcohol use: No    Frequency: Never  . Drug use: No  . Sexual activity: Yes  Other Topics Concern  . Not on file  Social History Narrative  . Not on file   Family History  Problem Relation Age of Onset  . Diabetes Mother   . Cancer Mother   . Hypertension Mother   . Hypertension Father   . Diabetes Father   . Cancer Father   . Breast cancer Maternal Aunt   . Breast cancer Maternal Grandmother    Past Surgical History:  Procedure Laterality Date  . CHOLECYSTECTOMY  2001  . CYSTOSCOPY W/ URETEROSCOPY  ~ 2008   "pulled stone rest of the way out" (05/21/2013)  . EXTRACORPOREAL SHOCK WAVE LITHOTRIPSY Right 10/06/2017   Procedure: RIGHT EXTRACORPOREAL SHOCK WAVE LITHOTRIPSY (ESWL);  Surgeon: Bjorn Pippin, MD;  Location: [facility];  Service: Urology;  Laterality: Right;  . TONSILLECTOMY  1992      Mardella Layman, MD 02/01/18 [phone]

## 2018-01-30 DIAGNOSIS — S8261XA Displaced fracture of lateral malleolus of right fibula, initial encounter for closed fracture: Secondary | ICD-10-CM | POA: Diagnosis not present

## 2018-01-30 DIAGNOSIS — G8918 Other acute postprocedural pain: Secondary | ICD-10-CM | POA: Diagnosis not present

## 2018-01-30 DIAGNOSIS — S8262XA Displaced fracture of lateral malleolus of left fibula, initial encounter for closed fracture: Secondary | ICD-10-CM | POA: Diagnosis not present

## 2018-02-01 ENCOUNTER — Ambulatory Visit (HOSPITAL_COMMUNITY): Payer: Self-pay | Admitting: Psychiatry

## 2018-02-09 DIAGNOSIS — M25571 Pain in right ankle and joints of right foot: Secondary | ICD-10-CM | POA: Diagnosis not present

## 2018-02-09 DIAGNOSIS — M25572 Pain in left ankle and joints of left foot: Secondary | ICD-10-CM | POA: Diagnosis not present

## 2018-02-09 DIAGNOSIS — Z4789 Encounter for other orthopedic aftercare: Secondary | ICD-10-CM | POA: Diagnosis not present

## 2018-02-09 DIAGNOSIS — Z5189 Encounter for other specified aftercare: Secondary | ICD-10-CM | POA: Insufficient documentation

## 2018-02-16 DIAGNOSIS — Z4789 Encounter for other orthopedic aftercare: Secondary | ICD-10-CM | POA: Diagnosis not present

## 2018-02-16 DIAGNOSIS — S8262XD Displaced fracture of lateral malleolus of left fibula, subsequent encounter for closed fracture with routine healing: Secondary | ICD-10-CM | POA: Diagnosis not present

## 2018-03-13 ENCOUNTER — Other Ambulatory Visit (HOSPITAL_COMMUNITY): Payer: Self-pay

## 2018-03-13 DIAGNOSIS — M797 Fibromyalgia: Secondary | ICD-10-CM

## 2018-03-13 MED ORDER — GABAPENTIN 800 MG PO TABS: 800.0000 mg | ORAL_TABLET | Freq: Three times a day (TID) | ORAL | 0 refills | Status: DC

## 2018-03-21 DIAGNOSIS — S8261XD Displaced fracture of lateral malleolus of right fibula, subsequent encounter for closed fracture with routine healing: Secondary | ICD-10-CM | POA: Diagnosis not present

## 2018-03-21 DIAGNOSIS — S82832D Other fracture of upper and lower end of left fibula, subsequent encounter for closed fracture with routine healing: Secondary | ICD-10-CM | POA: Diagnosis not present

## 2018-03-21 DIAGNOSIS — Z4789 Encounter for other orthopedic aftercare: Secondary | ICD-10-CM | POA: Diagnosis not present

## 2018-04-18 DIAGNOSIS — Z4789 Encounter for other orthopedic aftercare: Secondary | ICD-10-CM | POA: Diagnosis not present

## 2018-04-18 DIAGNOSIS — M25572 Pain in left ankle and joints of left foot: Secondary | ICD-10-CM | POA: Diagnosis not present

## 2018-04-18 DIAGNOSIS — M25571 Pain in right ankle and joints of right foot: Secondary | ICD-10-CM | POA: Diagnosis not present

## 2018-04-21 ENCOUNTER — Other Ambulatory Visit (HOSPITAL_COMMUNITY): Payer: Self-pay

## 2018-04-21 DIAGNOSIS — M797 Fibromyalgia: Secondary | ICD-10-CM

## 2018-04-21 MED ORDER — GABAPENTIN 800 MG PO TABS: 800.0000 mg | ORAL_TABLET | Freq: Three times a day (TID) | ORAL | 0 refills | Status: DC

## 2018-05-03 ENCOUNTER — Other Ambulatory Visit (HOSPITAL_COMMUNITY): Payer: Self-pay

## 2018-05-03 ENCOUNTER — Telehealth (HOSPITAL_COMMUNITY): Payer: Self-pay

## 2018-05-03 DIAGNOSIS — F4310 Post-traumatic stress disorder, unspecified: Secondary | ICD-10-CM

## 2018-05-03 MED ORDER — PRAZOSIN HCL 2 MG PO CAPS: 2.0000 mg | ORAL_CAPSULE | Freq: Every day | ORAL | 0 refills | Status: DC

## 2018-05-03 MED ORDER — MIRTAZAPINE 15 MG PO TABS: 15.0000 mg | ORAL_TABLET | Freq: Every day | ORAL | 0 refills | Status: DC

## 2018-05-03 NOTE — Telephone Encounter (Signed)
Okay to send refill for 30 days, thank you!

## 2018-05-03 NOTE — Telephone Encounter (Addendum)
Received fax refill request for Prazosin 2mg  tabs and Mittazapine 15mg  tabs. Next appointment is scheduled for 05/15/18. Please advise

## 2018-05-03 NOTE — Telephone Encounter (Signed)
Sent in 30 day refill to pharmacy

## 2018-05-15 ENCOUNTER — Ambulatory Visit (INDEPENDENT_AMBULATORY_CARE_PROVIDER_SITE_OTHER): Payer: Commercial Managed Care - PPO | Admitting: Psychiatry

## 2018-05-15 ENCOUNTER — Encounter (HOSPITAL_COMMUNITY): Payer: Self-pay | Admitting: Psychiatry

## 2018-05-15 VITALS — BP 128/74 | HR 76 | Ht 65.0 in | Wt 172.0 lb

## 2018-05-15 DIAGNOSIS — Z87891 Personal history of nicotine dependence: Secondary | ICD-10-CM | POA: Diagnosis not present

## 2018-05-15 DIAGNOSIS — M797 Fibromyalgia: Secondary | ICD-10-CM | POA: Diagnosis not present

## 2018-05-15 DIAGNOSIS — F4310 Post-traumatic stress disorder, unspecified: Secondary | ICD-10-CM | POA: Diagnosis not present

## 2018-05-15 DIAGNOSIS — Z79899 Other long term (current) drug therapy: Secondary | ICD-10-CM

## 2018-05-15 MED ORDER — DULOXETINE HCL 30 MG PO CPEP: ORAL_CAPSULE | ORAL | 2 refills | Status: DC

## 2018-05-15 MED ORDER — GABAPENTIN 800 MG PO TABS: 800.0000 mg | ORAL_TABLET | Freq: Three times a day (TID) | ORAL | 2 refills | Status: DC

## 2018-05-15 MED ORDER — PRAZOSIN HCL 2 MG PO CAPS: 2.0000 mg | ORAL_CAPSULE | Freq: Every day | ORAL | 0 refills | Status: DC

## 2018-05-15 NOTE — Progress Notes (Signed)
BH MD/PA/NP OP Progress Note  05/15/2018 12:45 PM Jordan Zimmerman  MRN:  [id number]  Chief Complaint: think I slid back a little bit  HPI: Jordan Zimmerman presents with increased depression and anxiety, but overall feels better than she did before Lexapro.  May and June tend to be triggering for her because of trauma reminders during this time of the year.  She also has had multiple medical issues, and required surgery because she broke both her ankles.  She is continued on Lexapro, Remeron, prazosin.  Does not feel that the prazosin contributed to her fall, she tends to take it around 8 PM at night, and her fall was in the morning around 8-9 AM.  We discussed switching her from the Lexapro and Remeron combination to Cymbalta, given her ongoing issues with chronic fatigue, fibromyalgia.  Discussed that Cymbalta is a dual action antidepressant.  Patient was receptive to this also given her weight gain over the past 3 months.  She denies any suicidality or unsafe thoughts.  She has been working with her individual therapist although has not been there in a couple months because of the broken ankles, and hopes to get back engaged in individual therapy on a regular basis.  Visit Diagnosis:    ICD-10-CM   1. PTSD (post-traumatic stress disorder) F43.10 DULoxetine (CYMBALTA) 30 MG capsule    prazosin (MINIPRESS) 2 MG capsule  2. Fibromyalgia M79.7 gabapentin (NEURONTIN) 800 MG tablet    DULoxetine (CYMBALTA) 30 MG capsule    Past Psychiatric History: See intake H&P for full details. Reviewed, with no updates at this time.  Past Medical History:  Past Medical History:  Diagnosis Date  . Anxiety   . Chronic fatigue   . Fibromyalgia   . History of kidney stones   . IBS (irritable bowel syndrome)   . Kidney stones   . Migraines     Past Surgical History:  Procedure Laterality Date  . CHOLECYSTECTOMY  2001  . CYSTOSCOPY W/ URETEROSCOPY  ~ 2008   "pulled stone rest of the way  out" (05/21/2013)  . EXTRACORPOREAL SHOCK WAVE LITHOTRIPSY Right 10/06/2017   Procedure: RIGHT EXTRACORPOREAL SHOCK WAVE LITHOTRIPSY (ESWL);  Surgeon: Bjorn Pippin, MD;  Location: [facility];  Service: Urology;  Laterality: Right;  . FOOT FRACTURE SURGERY Bilateral   . TONSILLECTOMY  1992    Family Psychiatric History: See intake H&P for full details. Reviewed, with no updates at this time.   Family History:  Family History  Problem Relation Age of Onset  . Diabetes Mother   . Cancer Mother   . Hypertension Mother   . Hypertension Father   . Diabetes Father   . Cancer Father   . Breast cancer Maternal Aunt   . Breast cancer Maternal Grandmother     Social History:  Social History   Socioeconomic History  . Marital status: Married    Spouse name: Not on file  . Number of children: Not on file  . Years of education: Not on file  . Highest education level: Not on file  Occupational History  . Not on file  Social Needs  . Financial resource strain: Not on file  . Food insecurity:    Worry: Not on file    Inability: Not on file  . Transportation needs:    Medical: Not on file    Non-medical: Not on file  Tobacco Use  . Smoking status: Former Smoker    Packs/day: 1.00  Years: 4.00    Pack years: 4.00    Types: Cigarettes    Last attempt to quit: 11/29/1994    Years since quitting: 23.4  . Smokeless tobacco: Never Used  Substance and Sexual Activity  . Alcohol use: No    Frequency: Never  . Drug use: No  . Sexual activity: Yes  Lifestyle  . Physical activity:    Days per week: Not on file    Minutes per session: Not on file  . Stress: Not on file  Relationships  . Social connections:    Talks on phone: Not on file    Gets together: Not on file    Attends religious service: Not on file    Active member of club or organization: Not on file    Attends meetings of clubs or organizations: Not on file    Relationship status: Not on file  Other Topics Concern  . Not  on file  Social History Narrative  . Not on file    Allergies:  Allergies  Allergen Reactions  . Ambien [Zolpidem Tartrate] Anaphylaxis  . Amoxicillin Hives and Swelling    Fever.   . Penicillins Hives    High fever    Metabolic Disorder Labs: Lab Results  Component Value Date   HGBA1C 5.3 07/13/2017   No results found for: PROLACTIN No results found for: CHOL, TRIG, HDL, CHOLHDL, VLDL, LDLCALC Lab Results  Component Value Date   TSH 1.440 06/03/2017   TSH 1.820 03/16/2017    Therapeutic Level Labs: No results found for: LITHIUM No results found for: VALPROATE No components found for:  CBMZ  Current Medications: Current Outpatient Medications  Medication Sig Dispense Refill  . acetaminophen (TYLENOL) 500 MG tablet Take 1,000 mg by mouth every 6 (six) hours as needed.    . gabapentin (NEURONTIN) 800 MG tablet Take 1 tablet (800 mg total) by mouth 3 (three) times daily. 90 tablet 2  . HYDROcodone-acetaminophen (NORCO/VICODIN) 5-325 MG tablet Take 1 tablet by mouth every 6 (six) hours as needed for moderate pain or severe pain. 15 tablet 0  . ibuprofen (ADVIL,MOTRIN) 400 MG tablet Take 400 mg by mouth every 6 (six) hours as needed.    . methocarbamol (ROBAXIN) 500 MG tablet methocarbamol 500 mg tablet    . prazosin (MINIPRESS) 2 MG capsule Take 1 capsule (2 mg total) by mouth at bedtime. 90 capsule 0  . DULoxetine (CYMBALTA) 30 MG capsule Take 1 capsule (30 mg total) by mouth daily for 7 days, THEN 2 capsules (60 mg total) daily. 60 capsule 2   No current facility-administered medications for this visit.    Musculoskeletal: Strength & Muscle Tone: within normal limits Gait & Station: normal Patient leans: N/A  Psychiatric Specialty Exam: ROS  Blood pressure 128/74, pulse 76, height 5\' 5"  (1.651 m), weight 172 lb (78 kg).Body mass index is 28.62 kg/m.  General Appearance: Casual and Well Groomed  Eye Contact:  Good  Speech:  Clear and Coherent  Volume:  Normal   Mood:  Anxious and Dysphoric  Affect:  Congruent  Thought Process:  Coherent and Descriptions of Associations: Intact  Orientation:  Full (Time, Place, and Person)  Thought Content: Logical   Suicidal Thoughts:  No  Homicidal Thoughts:  No  Memory:  Immediate;   Fair  Judgement:  Fair  Insight:  Fair  Psychomotor Activity:  Normal  Concentration:  Concentration: Good  Recall:  Good  Fund of Knowledge: Good  Language: Good  Akathisia:  Negative  Handed:  Right  AIMS (if indicated): not done  Assets:  Communication Skills Desire for Improvement Financial Resources/Insurance Housing  ADL's:  Intact  Cognition: WNL  Sleep:  Good   Screenings: GAD-7     [facility]  Total GAD-7 Score  8  12    PHQ2-9     [facility]  PHQ-2 Total Score  0  0  0       Assessment and Plan:  Jordan Zimmerman presents with a slight increase in depressive symptoms and PTSD symptoms in the context of trauma reminders during the spring and summer season.  She is also been recovering from injuries after she broke both of her ankles when she fainted earlier this year in February.  She is continued on the Lexapro, Remeron, prazosin, but feels like her mood has slid back a bit, and continues to struggle with fibromyalgia and fatigue.  She has gained weight from Remeron, so we agreed to discontinue Remeron, and also switch Lexapro to a higher potency antidepressant.  We agreed to initiate Cymbalta and titrate to the goal dose of 120 mg daily over the coming months.  She does not present with any acute safety concerns or suicidality and has a therapy relationship that she intends to pick back up on now  that she has healed from her ankly injuries.  Disclosed to patient that this Clinical research associatewriter is leaving this practice at the end of August 2019, and patients always has the right to choose their provider. Reassured patient that office will work to provide smooth transition of care whether they wish to remain at this office, or to continue with this provider, or seek alternative care options in community.  They expressed understanding.   1. PTSD (post-traumatic stress disorder)   2. Fibromyalgia     Status of current problems: Flare in mood and depressive symptoms  Labs Ordered: No orders of the defined types were placed in this encounter.   Labs Reviewed: NA  Collateral Obtained/Records Reviewed: NA  Plan:  Discontinue Lexapro and Remeron Okay for patient to use Remeron as needed if she notices her sleep suffers Continue prazosin 2 mg nightly Continue gabapentin 800 mg 3 times daily Initiate Cymbalta 30 mg, increase to 60 mg in 1 week; goal dose will eventually be 90-120 mg Follow-up in 6 weeks  Burnard LeighAlexander Arya Kristy Schomburg, MD 05/15/2018, 12:45 PM

## 2018-05-30 DIAGNOSIS — M25572 Pain in left ankle and joints of left foot: Secondary | ICD-10-CM | POA: Diagnosis not present

## 2018-05-30 DIAGNOSIS — M25571 Pain in right ankle and joints of right foot: Secondary | ICD-10-CM | POA: Diagnosis not present

## 2018-06-19 ENCOUNTER — Ambulatory Visit (INDEPENDENT_AMBULATORY_CARE_PROVIDER_SITE_OTHER): Payer: Commercial Managed Care - PPO | Admitting: Physician Assistant

## 2018-06-19 ENCOUNTER — Encounter (INDEPENDENT_AMBULATORY_CARE_PROVIDER_SITE_OTHER): Payer: Self-pay | Admitting: Physician Assistant

## 2018-06-19 ENCOUNTER — Other Ambulatory Visit: Payer: Self-pay

## 2018-06-19 VITALS — BP 120/79 | HR 82 | Temp 97.9°F | Ht 65.0 in | Wt 170.2 lb

## 2018-06-19 DIAGNOSIS — R937 Abnormal findings on diagnostic imaging of other parts of musculoskeletal system: Secondary | ICD-10-CM

## 2018-06-19 DIAGNOSIS — M5442 Lumbago with sciatica, left side: Secondary | ICD-10-CM | POA: Diagnosis not present

## 2018-06-19 DIAGNOSIS — M5441 Lumbago with sciatica, right side: Secondary | ICD-10-CM

## 2018-06-19 DIAGNOSIS — N200 Calculus of kidney: Secondary | ICD-10-CM | POA: Insufficient documentation

## 2018-06-19 DIAGNOSIS — F418 Other specified anxiety disorders: Secondary | ICD-10-CM | POA: Insufficient documentation

## 2018-06-19 DIAGNOSIS — Z9049 Acquired absence of other specified parts of digestive tract: Secondary | ICD-10-CM | POA: Insufficient documentation

## 2018-06-19 DIAGNOSIS — G8929 Other chronic pain: Secondary | ICD-10-CM | POA: Diagnosis not present

## 2018-06-19 MED ORDER — CODEINE SULFATE 30 MG PO TABS: 30.0000 mg | ORAL_TABLET | Freq: Three times a day (TID) | ORAL | 0 refills | Status: AC | PRN

## 2018-06-19 NOTE — Progress Notes (Signed)
Subjective:  Patient ID: Jordan Zimmerman, female    DOB: August 29, 1971  Age: 47 y.o. MRN: [id number]  CC: back pain  HPI Jordan MannanBrandy S Furniss Roeis a 47 y.o.femalewith a PMH of anxiety, fibromyalgia, chronic fatigue, IBS, Migraines, and cholecystectomy, and kidney stones presents with chronic lower back pain. Referred to pain clinic at her last visit on 07/13/18 but has not been because she wanted to try psychiatry first. States she is doing somewhat better with a higher dose of gabapentin and the addition of Cymbalta.      Lower back pain attributed to moving a "chicken house" several years ago. Says that pain is progressing over the past month and radiates down both legs. Heat helps reduces pain. Muscle relaxants, OTC ibuprofen, and tylenol do not work. Imaging from 11/09/2000 revealed normal lumbar spine but incidentally found to be somewhat osteopenic. Does not endorse saddle paresthesia, LE weakness, paralysis, urinary incontinence, or fecal incontinence.       Outpatient Medications Prior to Visit  Medication Sig Dispense Refill  . acetaminophen (TYLENOL) 500 MG tablet Take 1,000 mg by mouth every 6 (six) hours as needed.    . DULoxetine (CYMBALTA) 30 MG capsule Take 1 capsule (30 mg total) by mouth daily for 7 days, THEN 2 capsules (60 mg total) daily. 60 capsule 2  . gabapentin (NEURONTIN) 800 MG tablet Take 1 tablet (800 mg total) by mouth 3 (three) times daily. 90 tablet 2  . ibuprofen (ADVIL,MOTRIN) 400 MG tablet Take 400 mg by mouth every 6 (six) hours as needed.    . prazosin (MINIPRESS) 2 MG capsule Take 1 capsule (2 mg total) by mouth at bedtime. 90 capsule 0  . HYDROcodone-acetaminophen (NORCO/VICODIN) 5-325 MG tablet Take 1 tablet by mouth every 6 (six) hours as needed for moderate pain or severe pain. 15 tablet 0  . methocarbamol (ROBAXIN) 500 MG tablet methocarbamol 500 mg tablet     No facility-administered medications prior to visit.      ROS Review of Systems   Constitutional: Negative for chills, fever and malaise/fatigue.  Eyes: Negative for blurred vision.  Respiratory: Negative for shortness of breath.   Cardiovascular: Negative for chest pain and palpitations.  Gastrointestinal: Negative for abdominal pain and nausea.  Genitourinary: Negative for dysuria and hematuria.  Musculoskeletal: Positive for back pain and myalgias. Negative for joint pain.  Skin: Negative for rash.  Neurological: Negative for tingling and headaches.  Psychiatric/Behavioral: Negative for depression. The patient is not nervous/anxious.     Objective:  Ht 5\' 5"  (1.651 m)   Wt 170 lb 3.2 oz (77.2 kg)   LMP 06/17/2018 (Exact Date)   BMI 28.32 kg/m   Vitals:   06/19/18 1530  BP: 120/79  Pulse: 82  Temp: 97.9 F (36.6 C)  SpO2: 91%      Physical Exam  Constitutional: She is oriented to person, place, and time.  Well developed, well nourished, NAD, polite  HENT:  Head: Normocephalic and atraumatic.  Eyes: No scleral icterus.  Neck: Normal range of motion. Neck supple. No thyromegaly present.  Cardiovascular: Normal rate, regular rhythm and normal heart sounds.  Pulmonary/Chest: Effort normal and breath sounds normal.  Abdominal: Soft. Bowel sounds are normal. There is no tenderness.  Musculoskeletal: She exhibits no edema.  Neurological: She is alert and oriented to person, place, and time.  Skin: Skin is warm and dry. No rash noted. No erythema. No pallor.  Psychiatric: She has a normal mood and affect. Her behavior is  normal. Thought content normal.  Vitals reviewed.    Assessment & Plan:   1. Chronic midline low back pain with bilateral sciatica - codeine 30 MG tablet; Take 1 tablet (30 mg total) by mouth every 8 (eight) hours as needed for up to 5 days.  Dispense: 15 tablet; Refill: 0 - Ambulatory referral to Physical Therapy  2. Abnormal x-ray of lumbar spine - DG Bone Density; Future   Meds ordered this encounter  Medications  .  codeine 30 MG tablet    Sig: Take 1 tablet (30 mg total) by mouth every 8 (eight) hours as needed for up to 5 days.    Dispense:  15 tablet    Refill:  0    Order Specific Question:   Supervising Provider    Answer:   Jordan Zimmerman [[id number]]    Follow-up: Return in about 1 month (around 07/17/2018) for lower back pain.   Loletta Specter PA

## 2018-06-19 NOTE — Patient Instructions (Signed)

## 2018-06-22 IMAGING — DX DG ABDOMEN ACUTE W/ 1V CHEST
3 series · 3 of 3 positions shown · non-contrast
Comparison: 10/14/2007 CT abdomen and pelvis. 12/04/2009 chest
radiograph.

CLINICAL DATA: 45 y/o F; abdominal pain, nausea, vomiting, back
pain.

EXAM:
DG ABDOMEN ACUTE W/ 1V CHEST

[chest pa]
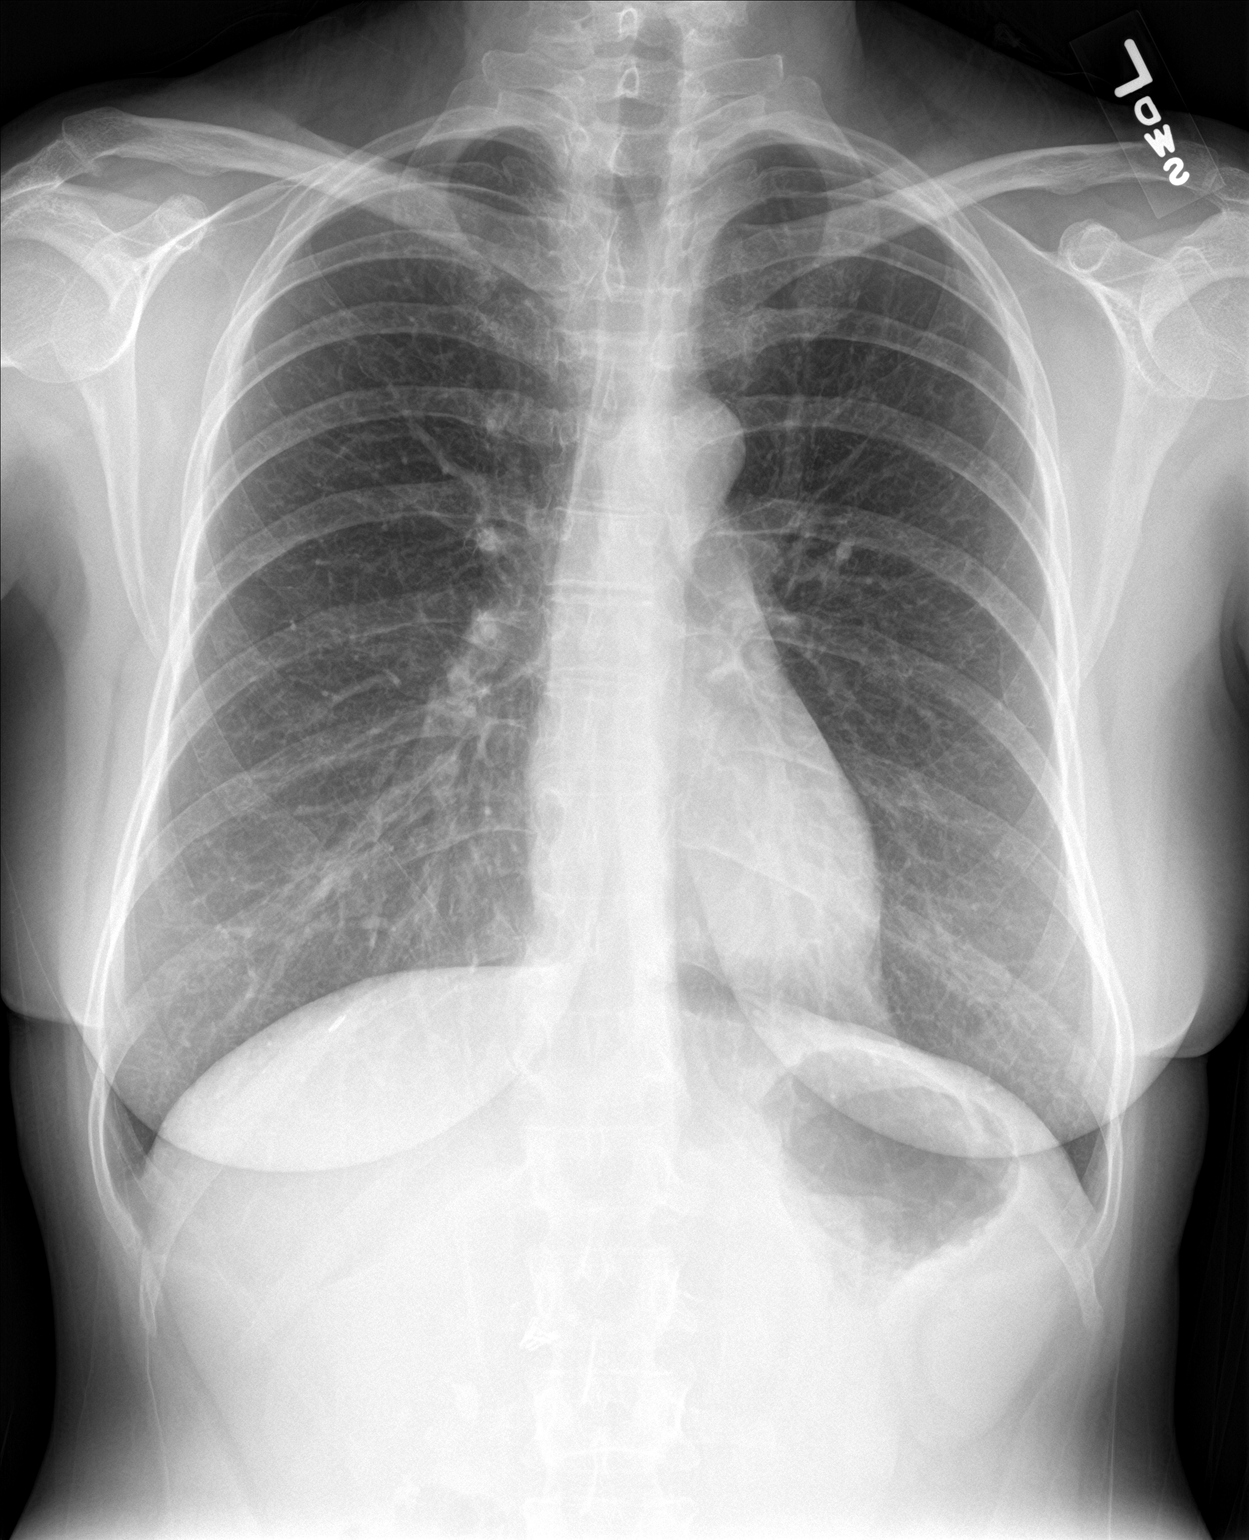

[abdomen erect]
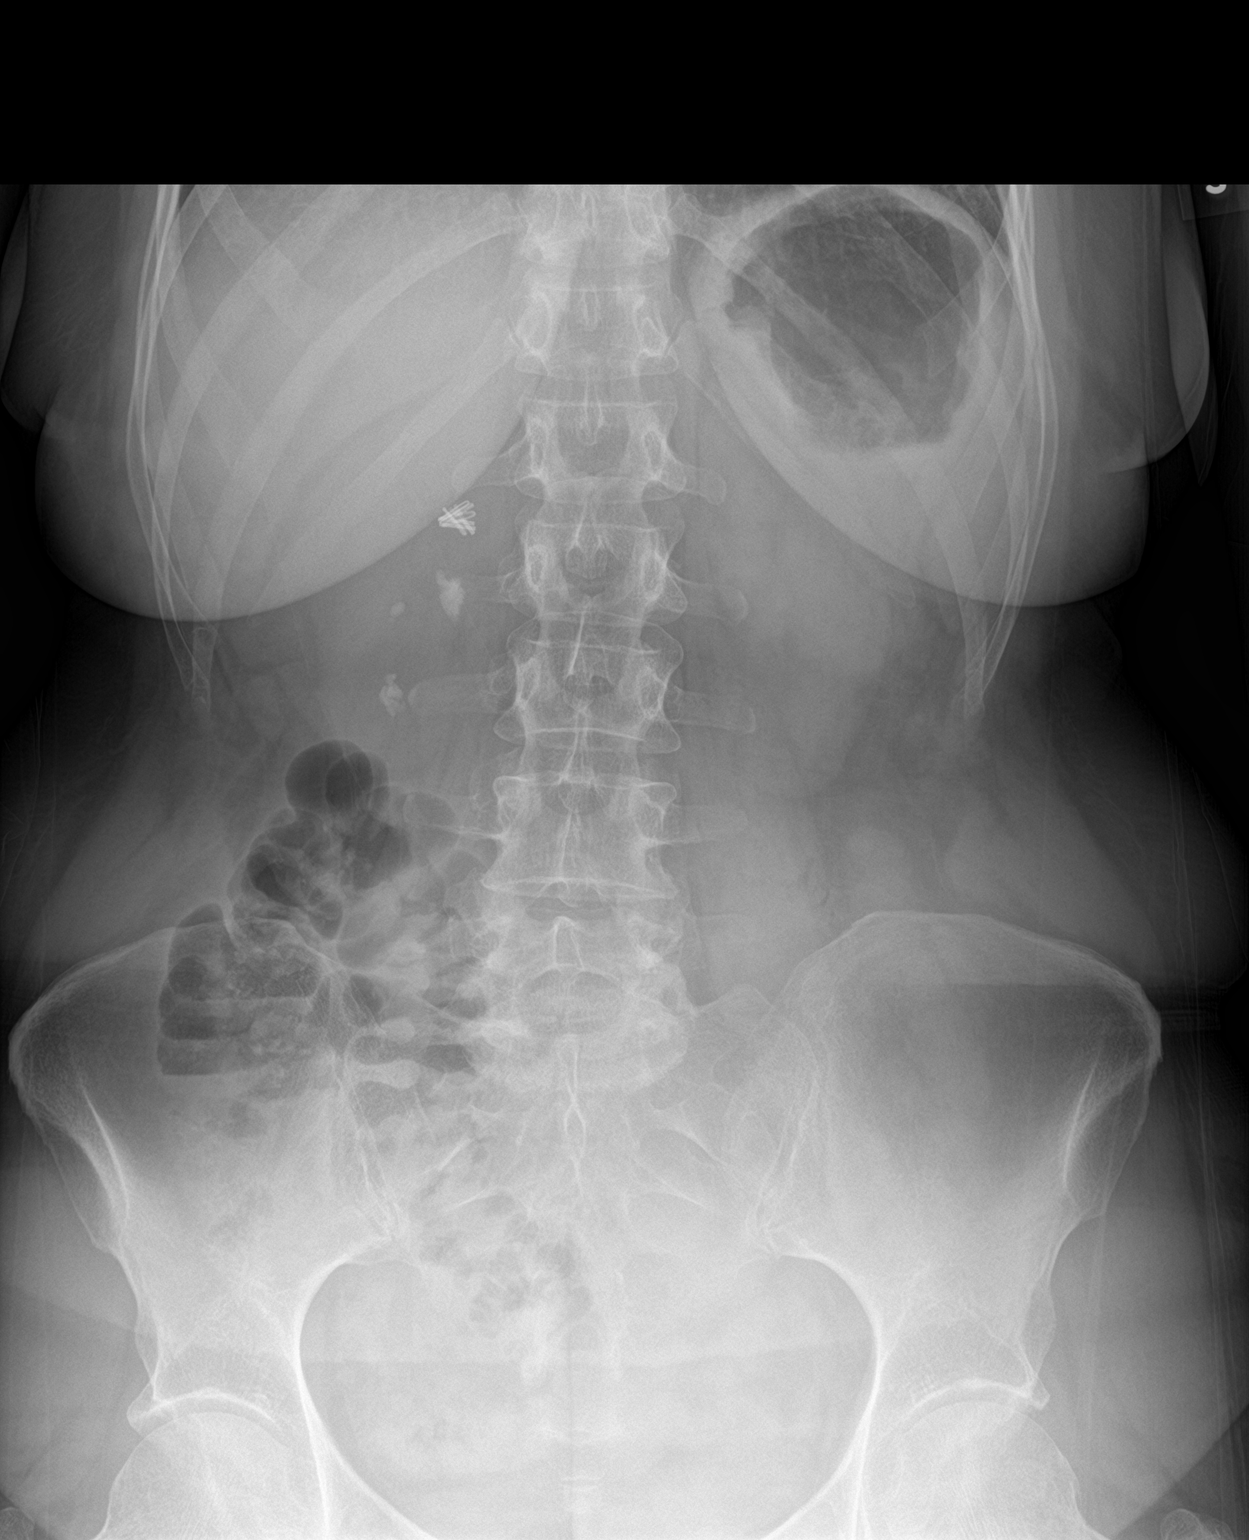

[abdomen supine]
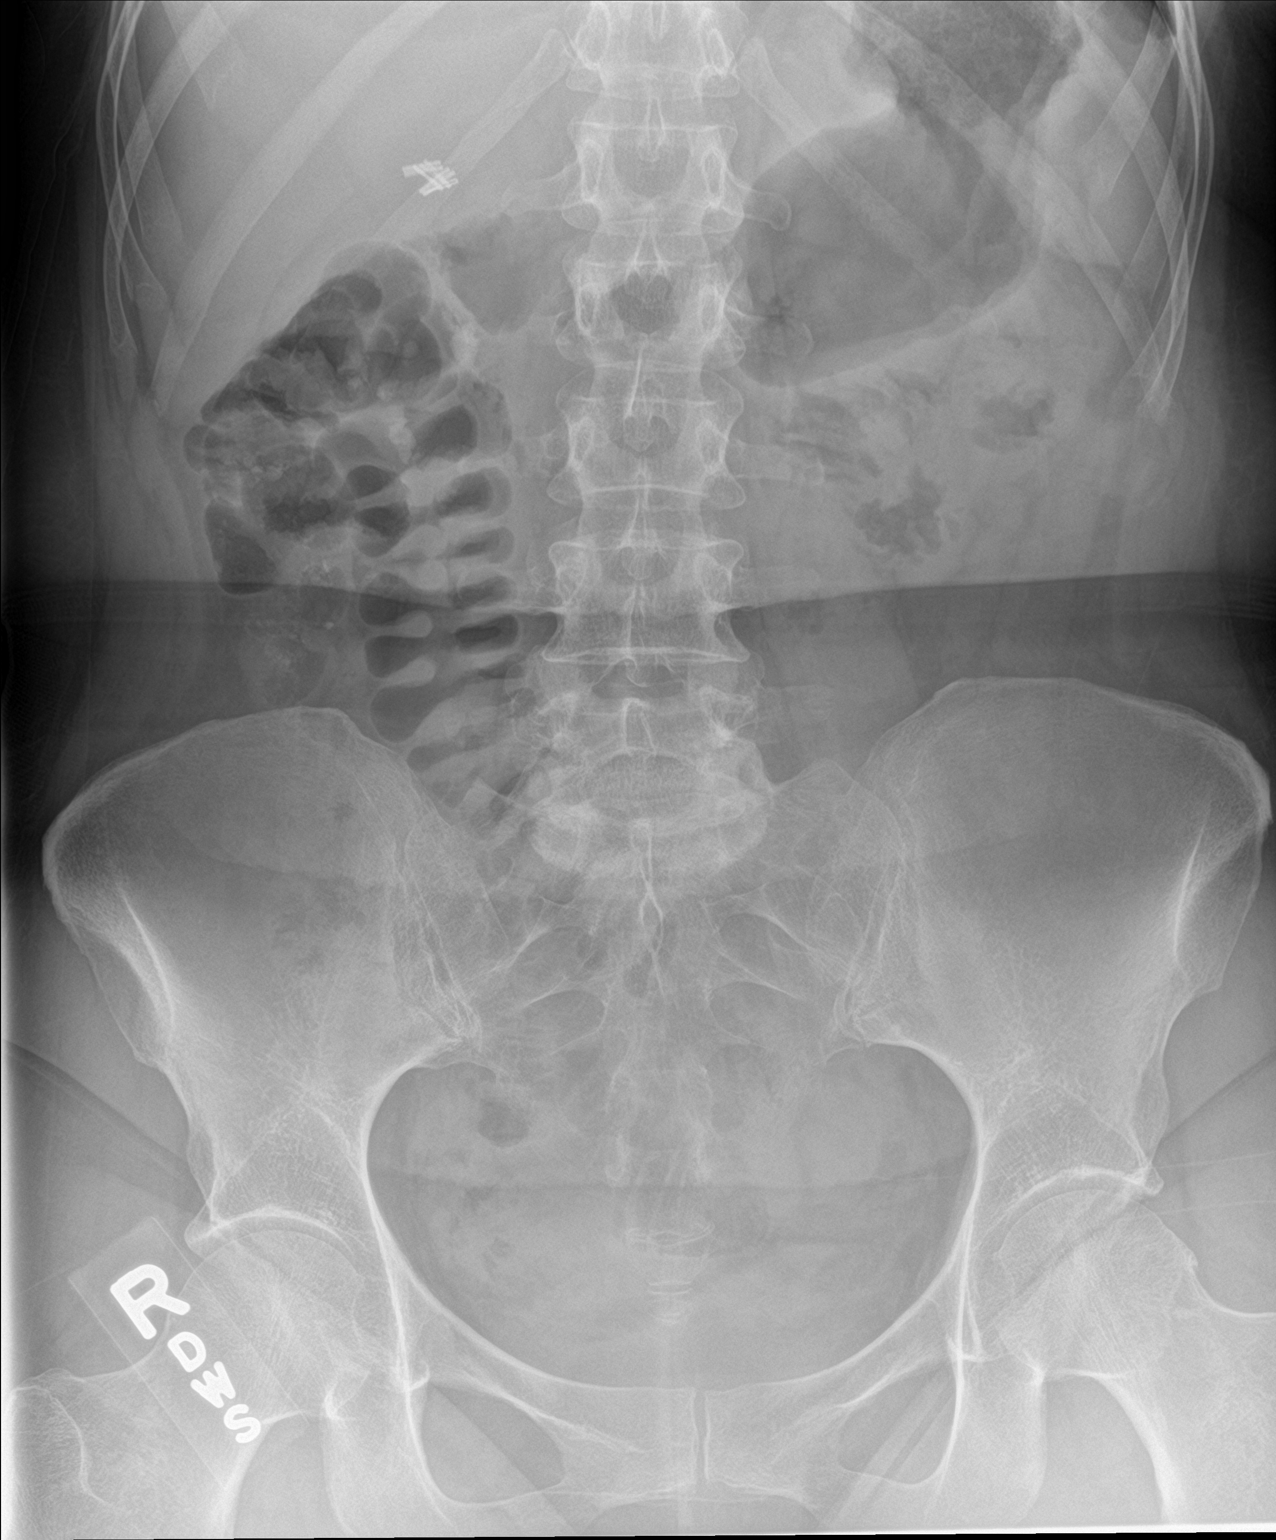

[3 of 3 positions shown; findings below may reference images not displayed]

FINDINGS: Normal bowel gas pattern. Right upper quadrant cholecystectomy
clips. Multiple stones project over the right kidney measuring up to
15 mm. Clear lungs. Normal cardiac silhouette. Bones are
unremarkable.
IMPRESSION: 1. Clear lungs.
2. Normal bowel gas pattern.
3. Right kidney stones.

By: Yukikazu Katono M.D.

## 2018-06-27 ENCOUNTER — Ambulatory Visit (HOSPITAL_COMMUNITY): Payer: Commercial Managed Care - PPO | Admitting: Psychiatry

## 2018-06-27 ENCOUNTER — Other Ambulatory Visit (INDEPENDENT_AMBULATORY_CARE_PROVIDER_SITE_OTHER): Payer: Self-pay | Admitting: Physician Assistant

## 2018-06-27 ENCOUNTER — Telehealth (INDEPENDENT_AMBULATORY_CARE_PROVIDER_SITE_OTHER): Payer: Self-pay | Admitting: Physician Assistant

## 2018-06-27 DIAGNOSIS — M8588 Other specified disorders of bone density and structure, other site: Secondary | ICD-10-CM

## 2018-06-27 NOTE — Telephone Encounter (Signed)
Tanya from the breast center called to request a new order for Bone Density with the correct diagnostics. [name] stated if you had any questions to give her a call at [phone]  Please Advice   Thank You [name]

## 2018-06-27 NOTE — Telephone Encounter (Signed)
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 Please let them know order and diagnosis is in.

## 2018-06-28 IMAGING — CR DG ABDOMEN 1V
2 series · 2 of 2 positions shown · non-contrast
Comparison: CT Abdomen and Pelvis 10/01/2017.

CLINICAL DATA: 45-year-old female with right nephrolithiasis,
planned lithotripsy.

EXAM:
ABDOMEN - 1 VIEW

[t abdomen supine (1 of 2)]
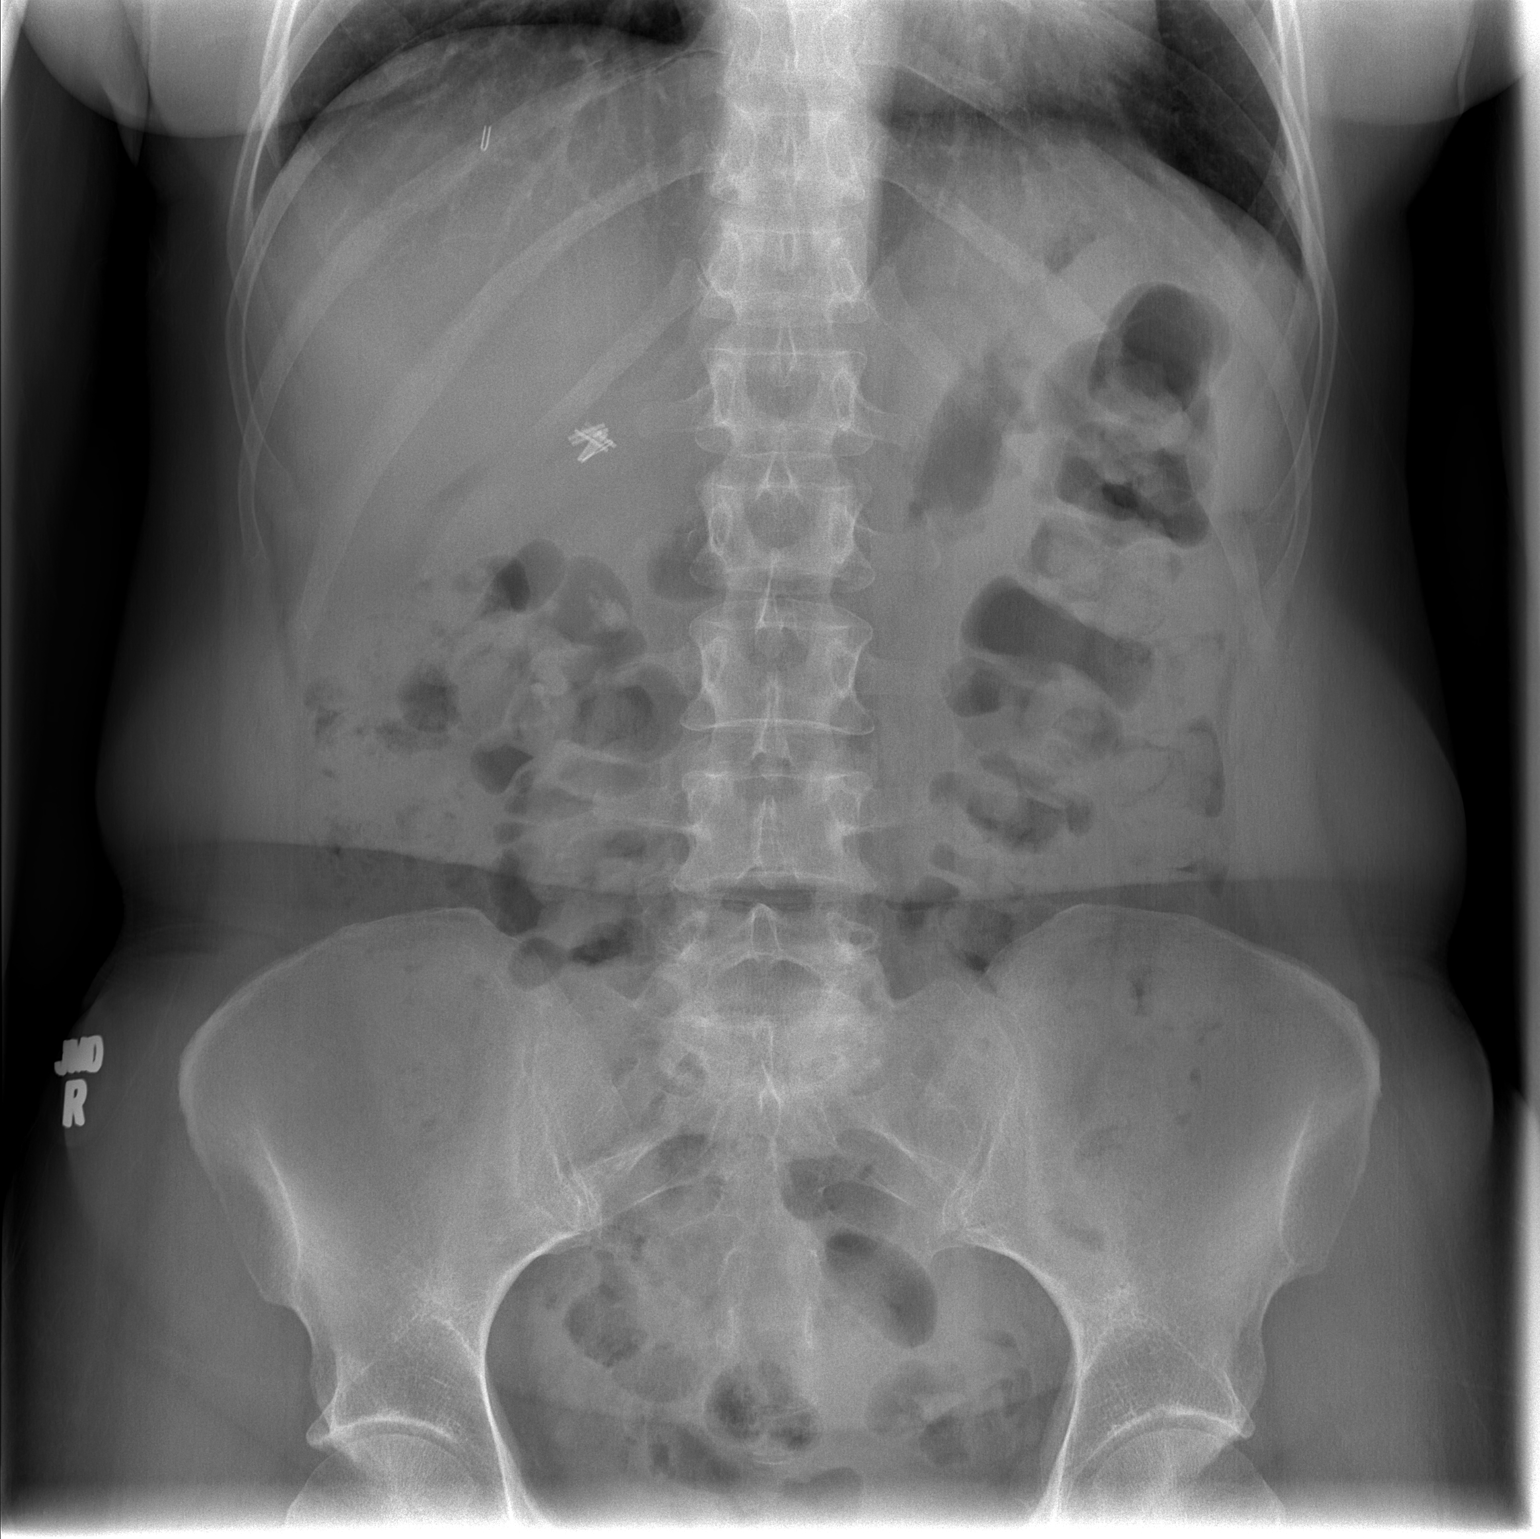

[t abdomen supine (2 of 2)]
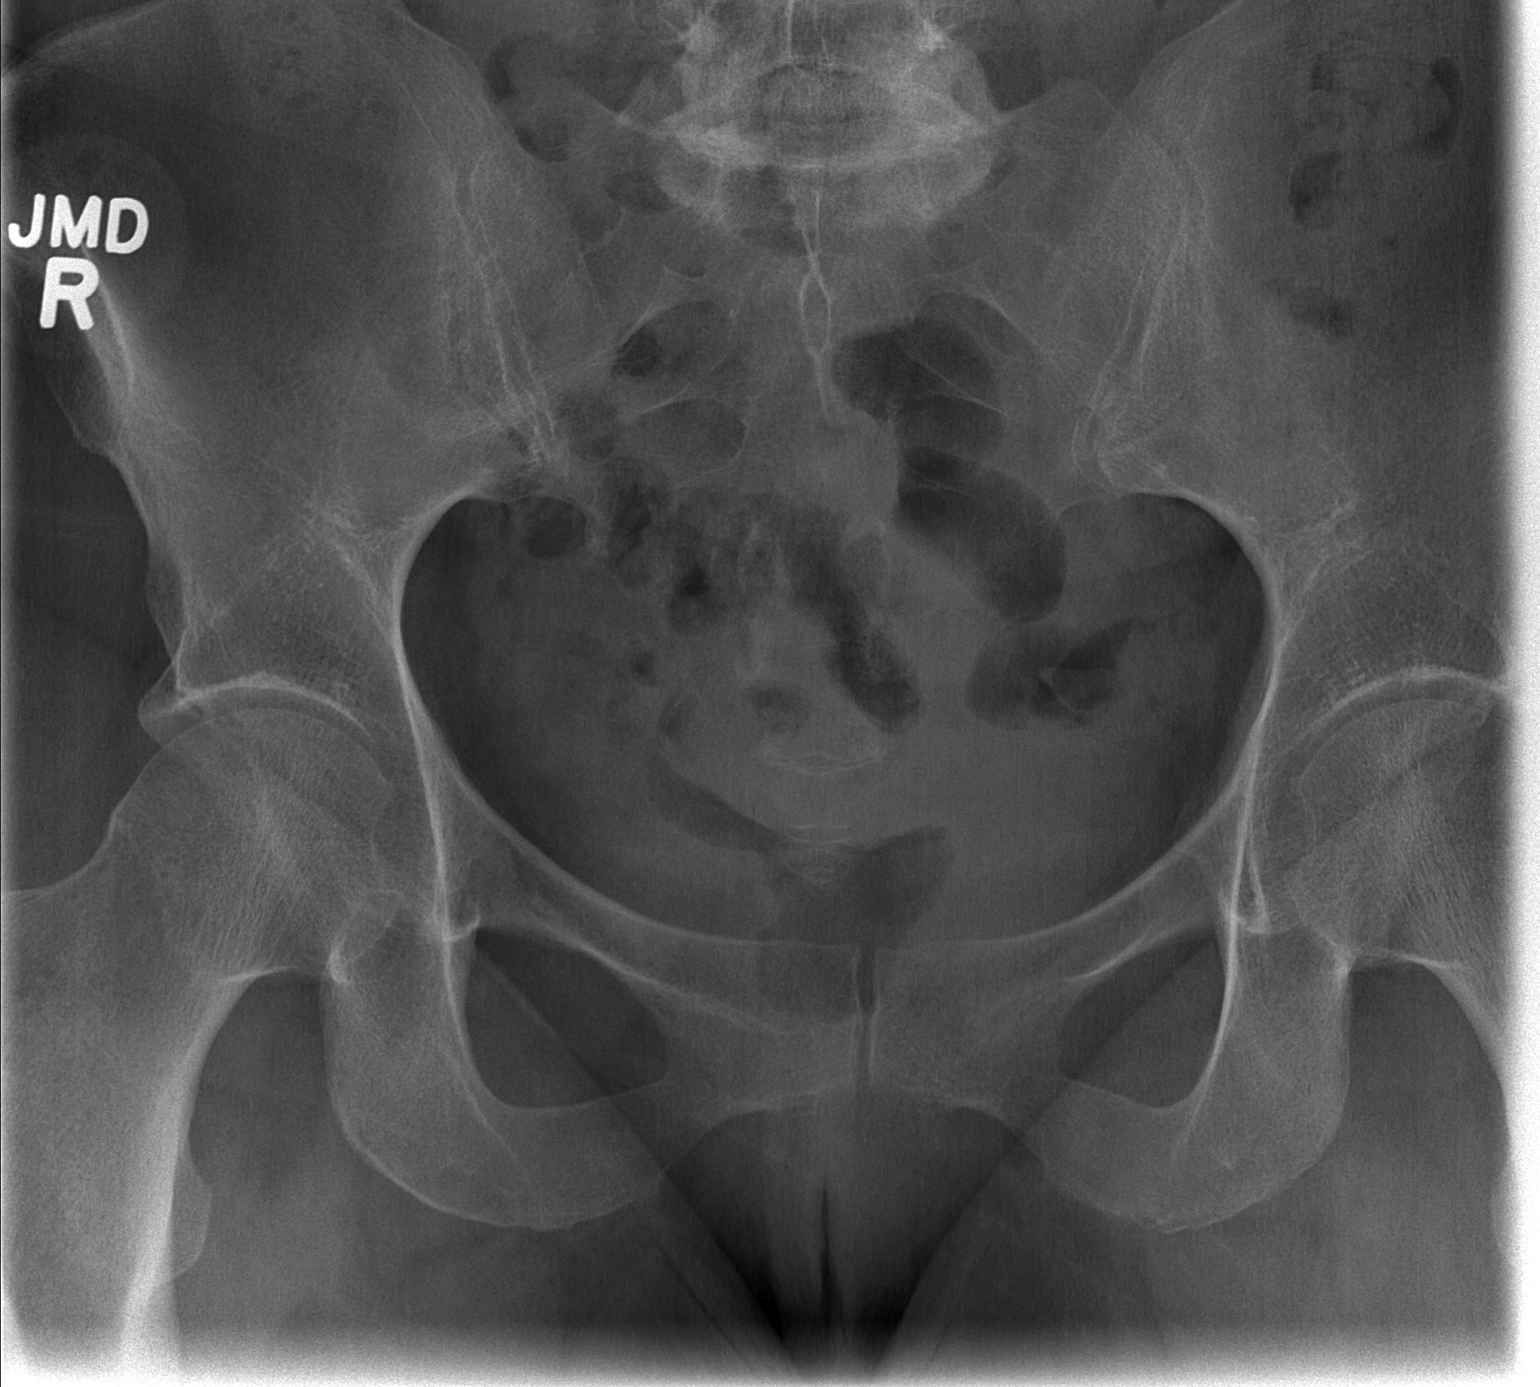

[2 of 2 positions shown; findings below may reference images not displayed]

FINDINGS: Stool containing large bowel overlies the right lower renal shadow
today. Right renal pelvis region and lower pole calculi appear
stable from the recent CT. No other urologic calculus identified.

Stable cholecystectomy clips. Negative visible lung bases. Non
obstructed bowel gas pattern. No osseous abnormality identified.
IMPRESSION: 1. Clustered calcifications at the right renal pelvis and lower pole
appear stable from the CT on 10/01/2017.
[DATE]. No new findings.

## 2018-06-28 NOTE — Telephone Encounter (Signed)
Left message for tanya at the breast center that order and diagnosis have been corrected. Maryjean Mornempestt S Roberts, CMA

## 2018-07-06 ENCOUNTER — Ambulatory Visit (INDEPENDENT_AMBULATORY_CARE_PROVIDER_SITE_OTHER): Payer: Commercial Managed Care - PPO | Admitting: Psychiatry

## 2018-07-06 ENCOUNTER — Encounter (HOSPITAL_COMMUNITY): Payer: Self-pay | Admitting: Psychiatry

## 2018-07-06 VITALS — BP 126/70 | HR 88 | Ht 65.0 in | Wt 175.0 lb

## 2018-07-06 DIAGNOSIS — R0683 Snoring: Secondary | ICD-10-CM | POA: Diagnosis not present

## 2018-07-06 DIAGNOSIS — M797 Fibromyalgia: Secondary | ICD-10-CM | POA: Diagnosis not present

## 2018-07-06 DIAGNOSIS — F4310 Post-traumatic stress disorder, unspecified: Secondary | ICD-10-CM | POA: Diagnosis not present

## 2018-07-06 DIAGNOSIS — Z87891 Personal history of nicotine dependence: Secondary | ICD-10-CM

## 2018-07-06 DIAGNOSIS — G4719 Other hypersomnia: Secondary | ICD-10-CM | POA: Diagnosis not present

## 2018-07-06 MED ORDER — DULOXETINE HCL 60 MG PO CPEP: 120.0000 mg | ORAL_CAPSULE | Freq: Every day | ORAL | 0 refills | Status: DC

## 2018-07-06 MED ORDER — GABAPENTIN 800 MG PO TABS: 800.0000 mg | ORAL_TABLET | Freq: Three times a day (TID) | ORAL | 2 refills | Status: DC

## 2018-07-06 MED ORDER — TOPIRAMATE 50 MG PO TABS: ORAL_TABLET | ORAL | 0 refills | Status: DC

## 2018-07-06 NOTE — Progress Notes (Signed)
BH MD/PA/NP OP Progress Note  07/06/2018 4:24 PM Jordan Zimmerman Jordan Zimmerman  MRN:  [id number]  Chief Complaint: med management  HPI: Jordan Zimmerman presents with some gradual improvements in her mood, reports that she does not feel quite yet where she needs to be in terms of anxiety and depression management.  She has tolerated gabapentin quite well for fibromyalgia and feels the Cymbalta does agree with her.  We agreed to increase Cymbalta as below.  Given her ongoing issues of snoring, difficulty with sleep, nightmares, and restlessness at night, we agreed to a trial of Topamax.  Remeron has been helpful with the weight gain is intolerable.  Given her snoring and daytime headaches, I also suggested that she may benefit from a sleep study which she was agreeable to.  Wt Readings from Last 3 Encounters:  07/06/18 175 lb (79.4 kg)  06/19/18 170 lb 3.2 oz (77.2 kg)  05/15/18 172 lb (78 kg)   Visit Diagnosis:    ICD-10-CM   1. Fibromyalgia M79.7 DULoxetine (CYMBALTA) 60 MG capsule    topiramate (TOPAMAX) 50 MG tablet    gabapentin (NEURONTIN) 800 MG tablet  2. PTSD (post-traumatic stress disorder) F43.10 DULoxetine (CYMBALTA) 60 MG capsule    topiramate (TOPAMAX) 50 MG tablet  3. Loud snoring R06.83 Home sleep test  4. Excessive daytime sleepiness G47.19 Home sleep test    Past Psychiatric History: See intake H&P for full details. Reviewed, with no updates at this time.  Past Medical History:  Past Medical History:  Diagnosis Date  . Anxiety   . Chronic fatigue   . Fibromyalgia   . History of kidney stones   . IBS (irritable bowel syndrome)   . Kidney stones   . Migraines     Past Surgical History:  Procedure Laterality Date  . CHOLECYSTECTOMY  2001  . CYSTOSCOPY W/ URETEROSCOPY  ~ 2008   "pulled stone rest of the way out" (05/21/2013)  . EXTRACORPOREAL SHOCK WAVE LITHOTRIPSY Right 10/06/2017   Procedure: RIGHT EXTRACORPOREAL SHOCK WAVE LITHOTRIPSY (ESWL);  Surgeon: Bjorn Pippin, MD;  Location: [facility];  Service: Urology;  Laterality: Right;  . FOOT FRACTURE SURGERY Bilateral   . TONSILLECTOMY  1992    Family Psychiatric History: See intake H&P for full details. Reviewed, with no updates at this time.   Family History:  Family History  Problem Relation Age of Onset  . Diabetes Mother   . Cancer Mother   . Hypertension Mother   . Hypertension Father   . Diabetes Father   . Cancer Father   . Breast cancer Maternal Aunt   . Breast cancer Maternal Grandmother     Social History:  Social History   Socioeconomic History  . Marital status: Married    Spouse name: Not on file  . Number of children: Not on file  . Years of education: Not on file  . Highest education level: Not on file  Occupational History  . Not on file  Social Needs  . Financial resource strain: Not on file  . Food insecurity:    Worry: Not on file    Inability: Not on file  . Transportation needs:    Medical: Not on file    Non-medical: Not on file  Tobacco Use  . Smoking status: Former Smoker    Packs/day: 1.00    Years: 4.00    Pack years: 4.00    Types: Cigarettes    Last attempt to quit: 11/29/1994  Years since quitting: 23.6  . Smokeless tobacco: Never Used  Substance and Sexual Activity  . Alcohol use: No    Frequency: Never  . Drug use: No  . Sexual activity: Yes  Lifestyle  . Physical activity:    Days per week: Not on file    Minutes per session: Not on file  . Stress: Not on file  Relationships  . Social connections:    Talks on phone: Not on file    Gets together: Not on file    Attends religious service: Not on file    Active member of club or organization: Not on file    Attends meetings of clubs or organizations: Not on file    Relationship status: Not on file  Other Topics Concern  . Not on file  Social History Narrative  . Not on file    Allergies:  Allergies  Allergen Reactions  . Ambien [Zolpidem Tartrate] Anaphylaxis  . Amoxicillin  Hives and Swelling    Fever.   . Penicillins Hives    High fever    Metabolic Disorder Labs: Lab Results  Component Value Date   HGBA1C 5.3 07/13/2017   No results found for: PROLACTIN No results found for: CHOL, TRIG, HDL, CHOLHDL, VLDL, LDLCALC Lab Results  Component Value Date   TSH 1.440 06/03/2017   TSH 1.820 03/16/2017    Therapeutic Level Labs: No results found for: LITHIUM No results found for: VALPROATE No components found for:  CBMZ  Current Medications: Current Outpatient Medications  Medication Sig Dispense Refill  . acetaminophen (TYLENOL) 500 MG tablet Take 1,000 mg by mouth every 8 (eight) hours as needed.    . DULoxetine (CYMBALTA) 60 MG capsule Take 2 capsules (120 mg total) by mouth daily. 180 capsule 0  . gabapentin (NEURONTIN) 800 MG tablet Take 1 tablet (800 mg total) by mouth 3 (three) times daily. 90 tablet 2  . ibuprofen (ADVIL,MOTRIN) 400 MG tablet Take 400 mg by mouth every 6 (six) hours as needed.    . topiramate (TOPAMAX) 50 MG tablet Take 0.5 tablets (25 mg total) by mouth at bedtime for 3 days, THEN 1 tablet (50 mg total) at bedtime. 90 tablet 0   No current facility-administered medications for this visit.    Musculoskeletal: Strength & Muscle Tone: within normal limits Gait & Station: normal Patient leans: N/A  Psychiatric Specialty Exam: ROS  Blood pressure 126/70, pulse 88, height 5\' 5"  (1.651 m), weight 175 lb (79.4 kg), last menstrual period 06/17/2018.Body mass index is 29.12 kg/m.  General Appearance: Casual and Well Groomed  Eye Contact:  Good  Speech:  Clear and Coherent  Volume:  Normal  Mood:  Anxious and Dysphoric  Affect:  Congruent  Thought Process:  Coherent and Descriptions of Associations: Intact  Orientation:  Full (Time, Place, and Person)  Thought Content: Logical   Suicidal Thoughts:  No  Homicidal Thoughts:  No  Memory:  Immediate;   Fair  Judgement:  Fair  Insight:  Fair  Psychomotor Activity:  Normal   Concentration:  Concentration: Good  Recall:  Good  Fund of Knowledge: Good  Language: Good  Akathisia:  Negative  Handed:  Right  AIMS (if indicated): not done  Assets:  Communication Skills Desire for Improvement Financial Resources/Insurance Housing  ADL's:  Intact  Cognition: WNL  Sleep:  Fair   Screenings: GAD-7     [facility]  Total GAD-7 Score  6  8  12     PHQ2-9     Office Visit from 06/19/2018 in [facility] Office Visit from 07/13/2017 in [facility] Office Visit from 06[facility]03/2017 [facility] Office Visit from 03/16/2017 [facility]  PHQ-2 Total Score  1  0  0  0      Assessment and Plan:  Larenda S Furniss Schoeneck presents with a slight improvements.  Would benefit from further increase of Cymbalta 120 mg daily, and reports that she has had good tolerability.  She denies any acute safety issues.  She has good support from her husband.  She continues to take Remeron at night for sleep because it has been effective but the weight gain has been intolerable.  She feels like her pants fit her more tight and feels uncomfortable physically.  Prazosin has not been effective so we agreed to discontinue.  We agreed to a trial of Topamax nightly to help with ongoing insomnia, daytime headaches, and augmentation for anxiety symptoms.  We will follow-up in 8-10 weeks or sooner if needed in addition to sleep study as below.  1. Fibromyalgia   2. PTSD (post-traumatic stress disorder)   3. Loud snoring   4. Excessive daytime sleepiness     Status of current problems: Flare in mood and depressive symptoms  Labs Ordered: Orders Placed This Encounter  Procedures  . Home sleep test    Standing Status:   Future     Standing Expiration Date:   07/07/2019    Order Specific Question:   Where should this test be performed:    Answer:   [facility]    Labs Reviewed: NA  Collateral Obtained/Records Reviewed: NA  Plan:  Cymbalta 120 mg daily Continue gabapentin 800 mg 3 times daily Sleep study as ordered   Burnard Leigh, MD 07/06/2018, 4:24 PM

## 2018-07-18 ENCOUNTER — Ambulatory Visit (INDEPENDENT_AMBULATORY_CARE_PROVIDER_SITE_OTHER): Payer: Self-pay | Admitting: Physician Assistant

## 2018-08-14 ENCOUNTER — Ambulatory Visit
Admission: RE | Admit: 2018-08-14 | Discharge: 2018-08-14 | Disposition: A | Discharge status: Home / Self Care | Payer: Commercial Managed Care - PPO | Source: Ambulatory Visit | Attending: Physician Assistant | Admitting: Physician Assistant

## 2018-08-14 DIAGNOSIS — M810 Age-related osteoporosis without current pathological fracture: Secondary | ICD-10-CM | POA: Diagnosis not present

## 2018-08-14 DIAGNOSIS — M85851 Other specified disorders of bone density and structure, right thigh: Secondary | ICD-10-CM | POA: Diagnosis not present

## 2018-08-14 DIAGNOSIS — M8588 Other specified disorders of bone density and structure, other site: Secondary | ICD-10-CM

## 2018-08-14 DIAGNOSIS — Z780 Asymptomatic menopausal state: Secondary | ICD-10-CM | POA: Diagnosis not present

## 2018-08-17 ENCOUNTER — Encounter (INDEPENDENT_AMBULATORY_CARE_PROVIDER_SITE_OTHER): Payer: Self-pay | Admitting: Physician Assistant

## 2018-08-17 ENCOUNTER — Ambulatory Visit (INDEPENDENT_AMBULATORY_CARE_PROVIDER_SITE_OTHER): Payer: Commercial Managed Care - PPO | Admitting: Physician Assistant

## 2018-08-17 ENCOUNTER — Other Ambulatory Visit: Payer: Self-pay

## 2018-08-17 VITALS — BP 113/75 | HR 76 | Temp 98.1°F | Ht 65.0 in | Wt 163.0 lb

## 2018-08-17 DIAGNOSIS — M545 Low back pain, unspecified: Secondary | ICD-10-CM

## 2018-08-17 DIAGNOSIS — G8929 Other chronic pain: Secondary | ICD-10-CM

## 2018-08-17 DIAGNOSIS — M8588 Other specified disorders of bone density and structure, other site: Secondary | ICD-10-CM | POA: Diagnosis not present

## 2018-08-17 MED ORDER — ACETAMINOPHEN-CODEINE #3 300-30 MG PO TABS: 1.0000 | ORAL_TABLET | Freq: Two times a day (BID) | ORAL | 0 refills | Status: DC | PRN

## 2018-08-17 NOTE — Patient Instructions (Signed)
Vitamin D Deficiency Vitamin D deficiency is when your body does not have enough vitamin D. Vitamin D is important because:  It helps your body use other minerals that your body needs.  It helps keep your bones strong and healthy.  It may help to prevent some diseases.  It helps your heart and other muscles work well.  You can get vitamin D by:  Eating foods with vitamin D in them.  Drinking or eating milk or other foods that have had vitamin D added to them.  Taking a vitamin D supplement.  Being in the sun.  Not getting enough vitamin D can make your bones become soft. It can also cause other health problems. Follow these instructions at home:  Take medicines and supplements only as told by your doctor.  Eat foods that have vitamin D. These include: ? Dairy products, cereals, or juices with added vitamin D. Check the label for vitamin D. ? Fatty fish like salmon or trout. ? Eggs. ? Oysters.  Do not use tanning beds.  Stay at a healthy weight. Lose weight, if needed.  Keep all follow-up visits as told by your doctor. This is important. Contact a doctor if:  Your symptoms do not go away.  You feel sick to your stomach (nauseous).  Youthrow up (vomit).  You poop less often than usual or you have trouble pooping (constipation). This information is not intended to replace advice given to you by your health care provider. Make sure you discuss any questions you have with your health care provider. Document Released: 11/04/2011 Document Revised: 04/22/2016 Document Reviewed: 04/02/2015 Elsevier Interactive Patient Education  2018 Elsevier Inc.  

## 2018-08-17 NOTE — Progress Notes (Signed)
Subjective:  Patient ID: Evangeline Dakin, female    DOB: 1971/11/04  Age: 47 y.o. MRN: [id number]  CC: f/u lower back pain  HPI Bena Kobel Furniss Roeis a 47 y.o.femalewith a PMH of anxiety, fibromyalgia, chronic fatigue, IBS, Migraines, and cholecystectomy, and kidney stones presents with lower back pain. She has completed a bone density study which revealed a Z score of -2.6 at lumbar spine. She was referred to physical therapy but has not heard from them yet. Continues with moderate to moderately severe midline lumbar pain. Current medication regimen helps reduce back pain. Does not endorse any other symptoms or complaints.    Outpatient Medications Prior to Visit  Medication Sig Dispense Refill  . acetaminophen (TYLENOL) 500 MG tablet Take 1,000 mg by mouth every 8 (eight) hours as needed.    . DULoxetine (CYMBALTA) 60 MG capsule Take 2 capsules (120 mg total) by mouth daily. 180 capsule 0  . gabapentin (NEURONTIN) 800 MG tablet Take 1 tablet (800 mg total) by mouth 3 (three) times daily. 90 tablet 2  . ibuprofen (ADVIL,MOTRIN) 400 MG tablet Take 400 mg by mouth every 6 (six) hours as needed.    . topiramate (TOPAMAX) 50 MG tablet Take 0.5 tablets (25 mg total) by mouth at bedtime for 3 days, THEN 1 tablet (50 mg total) at bedtime. 90 tablet 0   No facility-administered medications prior to visit.      ROS Review of Systems  Constitutional: Negative for chills, fever and malaise/fatigue.  Eyes: Negative for blurred vision.  Respiratory: Negative for shortness of breath.   Cardiovascular: Negative for chest pain and palpitations.  Gastrointestinal: Negative for abdominal pain and nausea.  Genitourinary: Negative for dysuria and hematuria.  Musculoskeletal: Positive for back pain. Negative for joint pain and myalgias.  Skin: Negative for rash.  Neurological: Negative for tingling and headaches.  Psychiatric/Behavioral: Negative for depression. The patient is not  nervous/anxious.     Objective:  BP 113/75 (BP Location: Left Arm, Patient Position: Sitting, Cuff Size: Normal)   Pulse 76   Temp 98.1 F (36.7 C) (Oral)   Ht 5\' 5"  (1.651 m)   Wt 163 lb (73.9 kg)   LMP 07/30/2018   SpO2 100%   BMI 27.12 kg/m   BP/Weight 08/17/2018 06/19/2018 01/25/2018  Systolic BP 113 120 111  Diastolic BP 75 79 65  Wt. (Lbs) 163 170.2 160  BMI 27.12 28.32 26.63  Some encounter information is confidential and restricted. Go to Review Flowsheets activity to see all data.      Physical Exam  Constitutional: She is oriented to person, place, and time.  Well developed, well nourished, NAD, polite  HENT:  Head: Normocephalic and atraumatic.  Eyes: No scleral icterus.  Neck: Normal range of motion. Neck supple. No thyromegaly present.  Pulmonary/Chest: Effort normal.  Musculoskeletal: She exhibits no edema.  Mildly to moderately limited aROM of the back.   Neurological: She is alert and oriented to person, place, and time.  Gait somewhat antalgic.   Skin: Skin is warm and dry. No rash noted. No erythema. No pallor.  Psychiatric: She has a normal mood and affect. Her behavior is normal. Thought content normal.  Vitals reviewed.    Assessment & Plan:   1. Osteopenia of lumbar spine - Z score -2.6. Pt has history of IBS and Vit D deficiency. Mother with osteoporosis. - Vitamin D 1,25 dihydroxy - ANA w/Reflex - Sedimentation Rate - C-reactive protein - TSH - Celiac Disease Panel  2. Chronic midline low back pain without sciatica - Called outpatient rehab while patient was in clinic. We helped obtain appointment for pt   Meds ordered this encounter  Medications  . acetaminophen-codeine (TYLENOL #3) 300-30 MG tablet    Sig: Take 1 tablet by mouth every 12 (twelve) hours as needed for moderate pain.    Dispense:  60 tablet    Refill:  0    Order Specific Question:   Supervising Provider    Answer:   Hoy RegisterNEWLIN, ENOBONG [[id number]]    Follow-up: Return in  about 4 weeks (around 09/14/2018) for back pain.   Loletta Specteroger David Talasia Saulter PA

## 2018-08-24 ENCOUNTER — Telehealth (INDEPENDENT_AMBULATORY_CARE_PROVIDER_SITE_OTHER): Payer: Self-pay

## 2018-08-24 LAB — VITAMIN D 1,25 DIHYDROXY
Vitamin D 1, 25 (OH)2 Total: 53 pg/mL
Vitamin D2 1, 25 (OH)2: <10 pg/mL
Vitamin D3 1, 25 (OH)2: 53 pg/mL

## 2018-08-24 LAB — SEDIMENTATION RATE: Sed Rate: 2 mm/hr (ref 0–32)

## 2018-08-24 LAB — C-REACTIVE PROTEIN: CRP: <1 mg/L (ref 0–10)

## 2018-08-24 LAB — CELIAC DISEASE PANEL
Endomysial IgA: NEGATIVE
IgA/Immunoglobulin A, Serum: 170 mg/dL (ref 87–352)
Transglutaminase IgA: <2 U/mL (ref 0–3)

## 2018-08-24 LAB — TSH: TSH: 1.1 u[IU]/mL (ref 0.450–4.500)

## 2018-08-24 LAB — ANA W/REFLEX: Anti Nuclear Antibody(ANA): NEGATIVE

## 2018-08-24 NOTE — Telephone Encounter (Signed)
-----   Message from Loletta Specter, PA-C sent at 08/24/2018  1:06 PM EDT ----- Just got all results today. Normal Vit D. Thyroid normal. Negative for Celiac. Negative inflammatory markers. Negative Autoimmune disease.

## 2018-08-24 NOTE — Telephone Encounter (Signed)
Left voicemail notifying patient that Vitamin D is normal, thyroid normal. Negative for Celiac, negative inflammatory markers and negative autoimmune disease. Return call to [facility] at [phone] with any questions or concerns. Maryjean Morn, CMA

## 2018-08-30 ENCOUNTER — Ambulatory Visit: Payer: Commercial Managed Care - PPO | Attending: Physician Assistant | Admitting: Physical Therapy

## 2018-08-30 ENCOUNTER — Other Ambulatory Visit: Payer: Self-pay

## 2018-08-30 ENCOUNTER — Encounter: Payer: Self-pay | Admitting: Physical Therapy

## 2018-08-30 DIAGNOSIS — R293 Abnormal posture: Secondary | ICD-10-CM | POA: Diagnosis present

## 2018-08-30 DIAGNOSIS — G8929 Other chronic pain: Secondary | ICD-10-CM

## 2018-08-30 DIAGNOSIS — M5442 Lumbago with sciatica, left side: Secondary | ICD-10-CM | POA: Insufficient documentation

## 2018-08-30 DIAGNOSIS — M6281 Muscle weakness (generalized): Secondary | ICD-10-CM | POA: Insufficient documentation

## 2018-08-30 DIAGNOSIS — M62830 Muscle spasm of back: Secondary | ICD-10-CM | POA: Diagnosis present

## 2018-08-30 DIAGNOSIS — M5441 Lumbago with sciatica, right side: Secondary | ICD-10-CM | POA: Diagnosis not present

## 2018-08-31 ENCOUNTER — Encounter: Payer: Self-pay | Admitting: Physical Therapy

## 2018-08-31 NOTE — Patient Instructions (Addendum)
  Deep diaphragmatic core breathing 10x2 3x/day  Gentle trunk rotations 10x2 3x/day  Tennis ball roll out for back 10x2 -1 min. Holds 3x/day

## 2018-08-31 NOTE — Therapy (Addendum)
Jordan Zimmerman, [place], [address] Phone: [phone]   Fax:  [phone]  Physical Therapy Evaluation/Discharge   Patient Details  Name: Jordan Zimmerman MRN: [id number] Date of Birth: Dec 11, 1970 Referring Provider (PT): Roderic Ovens PA-C   Encounter Date: 08/30/2018  PT End of Session - 08/31/18 0806    Visit Number  1    Number of Visits  8    Date for PT Re-Evaluation  10/26/18    Authorization Type  [employer]    PT Start Time  0430    PT Stop Time  0519    PT Time Calculation (min)  49 min    Activity Tolerance  Patient limited by pain    Behavior During Therapy  [facility] for tasks assessed/performed       Past Medical History:  Diagnosis Date  . Anxiety   . Chronic fatigue   . Fibromyalgia   . History of kidney stones   . IBS (irritable bowel syndrome)   . Kidney stones   . Migraines     Past Surgical History:  Procedure Laterality Date  . CHOLECYSTECTOMY  2001  . CYSTOSCOPY W/ URETEROSCOPY  ~ 2008   "pulled stone rest of the way out" (05/21/2013)  . EXTRACORPOREAL SHOCK WAVE LITHOTRIPSY Right 10/06/2017   Procedure: RIGHT EXTRACORPOREAL SHOCK WAVE LITHOTRIPSY (ESWL);  Surgeon: Irine Seal, MD;  Location: [facility];  Service: Urology;  Laterality: Right;  . FOOT FRACTURE SURGERY Bilateral   . TONSILLECTOMY  1992    There were no vitals filed for this visit.   Subjective Assessment - 08/30/18 1636    Subjective  Pt has had back pain for past 5 years, but over the past 6 mo to a year pain has got unbearable. Occasionally has radiating pain down both legs but unable to determine what activity provokes radicular symptoms. Pt broke both ankles in the spring and is unsure if sedentary lifestyle as a result of that increased back pain.     Limitations  Sitting;Standing   staying in one position for too long especially if the chair doesn't have cushion to it or if has to sit on floor.   How  long can you sit comfortably?  10 minutes     How long can you stand comfortably?  10 minutes     Diagnostic tests  DUAL X-RAY ABSORPTIOMETRY (DXA) FOR BONE MINERAL DENSITY    Patient Stated Goals  stand,sit, gardening, rock newborn baby, driving without pain     Currently in Pain?  Yes    Pain Score  7     Pain Location  Back    Pain Orientation  Lower;Mid    Pain Descriptors / Indicators  Stabbing    Pain Type  Chronic pain    Pain Onset  More than a month ago    Pain Frequency  Constant    Aggravating Factors   standing,sitting past 10 minutes, sleeping     Pain Relieving Factors  heating pad, taking breaks, pain medications     Effect of Pain on Daily Activities  decreased significantly from previous level of function; likes to have spouse drive her if possible         [facility] PT Assessment - 08/31/18 0001      Ambulation/Gait   Gait Comments  Pt ambulates with increased stiffness, with decreased rotation or arm swing. Pt does not heel strike/toe off  Objective measurements completed on examination: See above findings.              PT Education - 08/31/18 1205    Education Details  Educated on HEP and log roll to decrease pain when getting in and out of bed    Person(s) Educated  Patient    Methods  Explanation;Demonstration;Verbal cues;Handout    Comprehension  Returned demonstration;Verbal cues required;Tactile cues required;Need further instruction       PT Short Term Goals - 08/31/18 1251      PT SHORT TERM GOAL #1   Title  Will increase lumbar flexion by 25%    Baseline  18 deg. flexion    Time  2    Period  Weeks    Status  New    Target Date  09/14/18      PT SHORT TERM GOAL #2   Title  Pt will increase gross LLE strength to 4/5     Baseline  3+    Time  2    Period  Weeks    Status  New    Target Date  09/14/18      PT SHORT TERM GOAL #3   Title  Pt will be I in HEP     Baseline  reviewd HEP     Time  2    Target  Date  09/14/18        PT Long Term Goals - 08/31/18 1257      PT LONG TERM GOAL #1   Title  Pt will be able to bend low back to pick items off of floor in order to complete housework     Baseline  unable to pick items off of floor    Time  4    Period  Weeks    Status  New    Target Date  09/28/18      PT LONG TERM GOAL #2   Title  Pt will be able to sit for 30 minutes in orde to hold grandchild in a few weeks     Baseline  unable to sit >10 minutes    Time  4    Period  Weeks    Status  New    Target Date  09/28/18      PT LONG TERM GOAL #3   Title  Pt will decrease FOTO score to limitations in order to improve overall function             Plan - 08/31/18 1238    Clinical Impression Statement  Pt is a 47 y.o. female with chronic low back pain that has worsened in the last 6 mo to a year. Pt broke both ankles earlier in the year  which lead to a significant decrease in activity and spent majority of her time recovering in bed. Pt demonstrated increased pain with all lumbar AROM and hip PROM. Patient also demonstrated increased weakness in Left LE compared to RLE with decreased ROM in left ankle. Patient demonstrated significant tenderness in lumbar region with palpation with multiple trigger points noted. Patient may benefit from dry needling after decreasing overall muscle tightness and desensitization. She would benefit from skilled PT to increase strength and mobility in order to get back to prior level of function.      History and Personal Factors relevant to plan of care:  PTSD, Depression with anxiety     Clinical Presentation  Evolving    Clinical Presentation due to:  Low back pain    Clinical Decision Making  Moderate    Rehab Potential  Fair    Clinical Impairments Affecting Rehab Potential  pain that increases with function    PT Frequency  2x / week    PT Duration  4 weeks    PT Treatment/Interventions  ADLs/Self Care Home Management;Cryotherapy;Electrical  Stimulation;Iontophoresis 55m/ml Dexamethasone;Moist Heat;Traction;Ultrasound;Gait training;Functional mobility training;Therapeutic activities;Therapeutic exercise;Balance training;Neuromuscular re-education;Patient/family education;Passive range of motion;Dry needling;Taping;Joint Manipulations    PT Next Visit Plan  Lower trunk rotations,knee to chest, pelvic tilts,  joint mobilizations, soft tissue mobilization, core stabilization- clam shells, ball squeezes     PT Home Exercise Plan  tennis ball trigger point release, breathing core stabilization, trunk rotations    Consulted and Agree with Plan of Care  Patient       Patient will benefit from skilled therapeutic intervention in order to improve the following deficits and impairments:  Abnormal gait, Pain, Decreased mobility, Increased muscle spasms, Decreased activity tolerance, Decreased endurance, Decreased range of motion, Decreased strength, Hypomobility, Impaired perceived functional ability, Impaired flexibility, Postural dysfunction, Increased fascial restricitons  Visit Diagnosis: Chronic midline low back pain with bilateral sciatica  Generalized muscle weakness  Back muscle spasm  Abnormal posture   PHYSICAL THERAPY DISCHARGE SUMMARY  Visits from Start of Care: 1  Current functional level related to goals / functional outcomes: Did not return for follow up   Remaining deficits: Unknown    Education / Equipment: Unknown  Plan: Patient agrees to discharge.  Patient goals were not met. Patient is being discharged due to not returning since the last visit.  ?????      Problem List Patient Active Problem List   Diagnosis Date Noted  . Osteopenia of lumbar spine 08/17/2018  . History of cholecystectomy 06/19/2018  . Chronic midline low back pain with bilateral sciatica 06/19/2018  . Renal calculi 06/19/2018  . Depression with anxiety 06/19/2018  . PTSD (post-traumatic stress disorder) 11/09/2017  . Fibromyalgia  05/21/2013  . Irritable bowel syndrome 05/21/2013   DCarolyne LittlesPT DPT  08/31/2018  KEinar CrowSPT  08/31/2018, 1:17 PM  During this treatment session, the therapist was present, participating in and directing the treatment.  [place] [address]Phone: [phone]  Fax:  [phone] Name: BDeeann ServidioMRN: [id number] of Birth: [birth year]

## 2018-09-04 ENCOUNTER — Encounter (INDEPENDENT_AMBULATORY_CARE_PROVIDER_SITE_OTHER): Payer: Self-pay | Admitting: Physician Assistant

## 2018-09-05 ENCOUNTER — Other Ambulatory Visit (INDEPENDENT_AMBULATORY_CARE_PROVIDER_SITE_OTHER): Payer: Self-pay | Admitting: Physician Assistant

## 2018-09-07 ENCOUNTER — Ambulatory Visit: Payer: Commercial Managed Care - PPO | Admitting: Physical Therapy

## 2018-09-12 ENCOUNTER — Ambulatory Visit: Payer: Commercial Managed Care - PPO | Admitting: Physical Therapy

## 2018-09-13 ENCOUNTER — Ambulatory Visit: Payer: Commercial Managed Care - PPO | Admitting: Physical Therapy

## 2018-09-18 ENCOUNTER — Ambulatory Visit: Payer: Commercial Managed Care - PPO

## 2018-09-20 ENCOUNTER — Ambulatory Visit: Payer: Commercial Managed Care - PPO | Admitting: Physical Therapy

## 2018-09-21 ENCOUNTER — Telehealth: Payer: Self-pay | Admitting: Physical Therapy

## 2018-09-21 DIAGNOSIS — M797 Fibromyalgia: Secondary | ICD-10-CM | POA: Diagnosis not present

## 2018-09-21 NOTE — Telephone Encounter (Signed)
Patient contacted regarding missed visit. Patient's voicemail reached.  She was reminded of her next visit and asked to contact us if she can not make it.

## 2018-09-25 ENCOUNTER — Ambulatory Visit: Payer: Commercial Managed Care - PPO | Admitting: Physical Therapy

## 2018-09-26 ENCOUNTER — Telehealth: Payer: Self-pay | Admitting: Physical Therapy

## 2018-09-26 NOTE — Telephone Encounter (Signed)
LVM with patient regarding 2nd missed visit. She has not attend any visits except her initial evaluation. Patient will be discharged per attendance policy. Patient will require a new prescription.

## 2018-09-27 ENCOUNTER — Ambulatory Visit: Payer: Commercial Managed Care - PPO | Admitting: Physical Therapy

## 2019-01-10 DIAGNOSIS — M797 Fibromyalgia: Secondary | ICD-10-CM | POA: Diagnosis not present

## 2020-06-10 DIAGNOSIS — M797 Fibromyalgia: Secondary | ICD-10-CM | POA: Diagnosis not present

## 2020-07-12 DIAGNOSIS — M797 Fibromyalgia: Secondary | ICD-10-CM | POA: Diagnosis not present

## 2020-10-18 DIAGNOSIS — M797 Fibromyalgia: Secondary | ICD-10-CM | POA: Diagnosis not present

## 2020-12-12 ENCOUNTER — Ambulatory Visit (INDEPENDENT_AMBULATORY_CARE_PROVIDER_SITE_OTHER): Payer: BC Managed Care – PPO

## 2020-12-12 ENCOUNTER — Ambulatory Visit (HOSPITAL_COMMUNITY)
Admission: EM | Admit: 2020-12-12 | Discharge: 2020-12-12 | Disposition: A | Discharge status: Home / Self Care | Payer: BC Managed Care – PPO | Attending: Emergency Medicine | Admitting: Emergency Medicine

## 2020-12-12 ENCOUNTER — Encounter (HOSPITAL_COMMUNITY): Payer: Self-pay

## 2020-12-12 ENCOUNTER — Other Ambulatory Visit: Payer: Self-pay

## 2020-12-12 DIAGNOSIS — M25572 Pain in left ankle and joints of left foot: Secondary | ICD-10-CM | POA: Diagnosis not present

## 2020-12-12 DIAGNOSIS — S93402A Sprain of unspecified ligament of left ankle, initial encounter: Secondary | ICD-10-CM | POA: Diagnosis not present

## 2020-12-12 DIAGNOSIS — M7989 Other specified soft tissue disorders: Secondary | ICD-10-CM | POA: Diagnosis not present

## 2020-12-12 MED ORDER — KETOROLAC TROMETHAMINE 60 MG/2ML IM SOLN
INTRAMUSCULAR | Status: AC
  Filled 2020-12-12: qty 2

## 2020-12-12 MED ORDER — HYDROCODONE-ACETAMINOPHEN 5-325 MG PO TABS: 1.0000 | ORAL_TABLET | Freq: Four times a day (QID) | ORAL | 0 refills | Status: DC | PRN

## 2020-12-12 MED ORDER — KETOROLAC TROMETHAMINE 60 MG/2ML IM SOLN
60.0000 mg | Freq: Once | INTRAMUSCULAR | Status: AC
  Administered 2020-12-12: 60 mg via INTRAMUSCULAR

## 2020-12-12 NOTE — Discharge Instructions (Addendum)
° °  Norco/Vicodin (hydrocodone-acetaminophen) is a narcotic pain medication, do not combine these medications with others containing tylenol. While taking, do not drink alcohol, drive, or perform any other activities that requires focus while taking these medications.   Call Monday to schedule an appointment with your previously established orthopedist for further evaluation and treatment of pain.

## 2020-12-12 NOTE — ED Provider Notes (Signed)
[facility]    CSN: [id number] Arrival date & time: 12/12/20  1616      History   Chief Complaint Chief Complaint  Patient presents with  . Foot Swelling  . Ankle Pain    HPI Jordan Zimmerman is a 50 y.o. female.   HPI  Jordan Zimmerman is a 50 y.o. female presenting to UC with c/o Left ankle pain, swelling and bruising that started this morning after slipping on a slick spot on a handicap ramp put in place for her service dog.  Pt reports rolling her ankle and hearing a "pop."  Pain is aching and throbbing, 8/10.  She applied ice, which initially helped with pain and swelling but swelling came back.  Last dose of pain medication was tylenol around 12PM.  She has fractured both her ankles in the past, requiring surgery.     Past Medical History:  Diagnosis Date  . Anxiety   . Chronic fatigue   . Fibromyalgia   . History of kidney stones   . IBS (irritable bowel syndrome)   . Kidney stones   . Migraines     Patient Active Problem List   Diagnosis Date Noted  . Osteopenia of lumbar spine 08/17/2018  . History of cholecystectomy 06/19/2018  . Chronic midline low back pain with bilateral sciatica 06/19/2018  . Renal calculi 06/19/2018  . Depression with anxiety 06/19/2018  . PTSD (post-traumatic stress disorder) 11/09/2017  . Fibromyalgia 05/21/2013  . Irritable bowel syndrome 05/21/2013    Past Surgical History:  Procedure Laterality Date  . CHOLECYSTECTOMY  2001  . CYSTOSCOPY W/ URETEROSCOPY  ~ 2008   "pulled stone rest of the way out" (05/21/2013)  . EXTRACORPOREAL SHOCK WAVE LITHOTRIPSY Right 10/06/2017   Procedure: RIGHT EXTRACORPOREAL SHOCK WAVE LITHOTRIPSY (ESWL);  Surgeon: Bjorn Pippin, MD;  Location: [facility];  Service: Urology;  Laterality: Right;  . FOOT FRACTURE SURGERY Bilateral   . TONSILLECTOMY  1992    OB History    Gravida  2   Para  2   Term  2   Preterm      AB      Living        SAB      IAB      Ectopic      Multiple      Live  Births  2            Home Medications    Prior to Admission medications   Medication Sig Start Date End Date Taking? Authorizing Provider  gabapentin (NEURONTIN) 800 MG tablet Take 1 tablet (800 mg total) by mouth 3 (three) times daily. 07/06/18  Yes Eksir, Bo Mcclintock, MD  HYDROcodone-acetaminophen (NORCO/VICODIN) 5-325 MG tablet Take 1 tablet by mouth every 6 (six) hours as needed for moderate pain or severe pain. 12/12/20  Yes Kerrilynn Derenzo, Vangie Bicker, PA-C  DULoxetine (CYMBALTA) 60 MG capsule Take 2 capsules (120 mg total) by mouth daily. 07/06/18 07/06/19  Burnard Leigh, MD  ibuprofen (ADVIL,MOTRIN) 400 MG tablet Take 400 mg by mouth every 6 (six) hours as needed.    [provider]  topiramate (TOPAMAX) 50 MG tablet Take 0.5 tablets (25 mg total) by mouth at bedtime for 3 days, THEN 1 tablet (50 mg total) at bedtime. 07/06/18 09/30/18  Burnard Leigh, MD    Family History Family History  Problem Relation Age of Onset  . Diabetes Mother   . Cancer Mother   . Hypertension Mother   .  Hypertension Father   . Diabetes Father   . Cancer Father   . Breast cancer Maternal Aunt   . Breast cancer Maternal Grandmother     Social History Social History   Tobacco Use  . Smoking status: Former Smoker    Packs/day: 1.00    Years: 4.00    Pack years: 4.00    Types: Cigarettes    Quit date: 11/29/1994    Years since quitting: 26.0  . Smokeless tobacco: Never Used  Vaping Use  . Vaping Use: Former  Substance Use Topics  . Alcohol use: No  . Drug use: No     Allergies   Ambien [zolpidem tartrate], Amoxicillin, and Penicillins   Review of Systems Review of Systems  Musculoskeletal: Positive for arthralgias and joint swelling.  Skin: Positive for color change. Negative for wound.     Physical Exam Triage Vital Signs ED Triage Vitals  Enc Vitals Group     BP 12/12/20 1724 96/69     Pulse Rate 12/12/20 1724 88     Resp 12/12/20 1724 17     Temp 12/12/20 1724  98.9 F (37.2 C)     Temp Source 12/12/20 1724 Oral     SpO2 12/12/20 1724 97 %     Weight --      Height --      Head Circumference --      Peak Flow --      Pain Score 12/12/20 1722 8     Pain Loc --      Pain Edu? --      Excl. in GC? --    No data found.  Updated Vital Signs BP 96/69 (BP Location: Left Arm)   Pulse 88   Temp 98.9 F (37.2 C) (Oral)   Resp 17   LMP 11/14/2020 (Exact Date)   SpO2 97%   Visual Acuity Right Eye Distance:   Left Eye Distance:   Bilateral Distance:    Right Eye Near:   Left Eye Near:    Bilateral Near:     Physical Exam Vitals and nursing note reviewed.  Constitutional:      General: She is not in acute distress.    Appearance: Normal appearance. She is well-developed and well-nourished. She is not ill-appearing, toxic-appearing or diaphoretic.  HENT:     Head: Normocephalic and atraumatic.  Eyes:     Extraocular Movements: EOM normal.  Cardiovascular:     Rate and Rhythm: Normal rate and regular rhythm.     Pulses:          Dorsalis pedis pulses are 2+ on the left side.       Posterior tibial pulses are 2+ on the left side.  Pulmonary:     Effort: Pulmonary effort is normal.  Musculoskeletal:        General: Swelling and tenderness present. Normal range of motion.     Cervical back: Normal range of motion.  Skin:    General: Skin is warm and dry.     Findings: Bruising present.  Neurological:     Mental Status: She is alert and oriented to person, place, and time.  Psychiatric:        Mood and Affect: Mood and affect normal.        Behavior: Behavior normal.      UC Treatments / Results  Labs (all labs ordered are listed, but only abnormal results are displayed) Labs Reviewed - No data to display  EKG  Radiology DG Ankle Complete Left  Result Date: 12/12/2020 CLINICAL DATA:  Left ankle pain after an injury when the patient fell on a ramp. History of prior lateral malleolar fracture. Initial encounter. EXAM:  LEFT ANKLE COMPLETE - 3+ VIEW COMPARISON:  Plain films left ankle 01/25/2018. FINDINGS: Previously seen lateral malleolar fracture has been fixed with plate and screws and an interfragmentary screw. Position and alignment are anatomic. No acute fracture is identified. Lateral soft tissue swelling is noted. IMPRESSION: Lateral soft tissue swelling without underlying acute fracture. Remote healed lateral malleolar fracture with fixation hardware in place. Electronically Signed   By: Drusilla Kanner M.D.   On: 12/12/2020 18:53    Procedures Procedures (including critical care time)  Medications Ordered in UC Medications  ketorolac (TORADOL) injection 60 mg (60 mg Intramuscular Given 12/12/20 1739)    Initial Impression / Assessment and Plan / UC Course  I have reviewed the triage vital signs and the nursing notes.  Pertinent labs & imaging results that were available during my care of the patient were reviewed by me and considered in my medical decision making (see chart for details).     Discussed imaging with pt, pt has her own ASO ankle splint she likes wearing, can continue to use. Crutches provided as she gave away her last pair Rx: norco F/u with previously established orthopedist from last ankle injury about 3 years ago.      Final Clinical Impressions(s) / UC Diagnoses   Final diagnoses:  Moderate left ankle sprain, initial encounter     Discharge Instructions       Norco/Vicodin (hydrocodone-acetaminophen) is a narcotic pain medication, do not combine these medications with others containing tylenol. While taking, do not drink alcohol, drive, or perform any other activities that requires focus while taking these medications.   Call Monday to schedule an appointment with your previously established orthopedist for further evaluation and treatment of pain.     ED Prescriptions    Medication Sig Dispense Auth. Provider   HYDROcodone-acetaminophen (NORCO/VICODIN) 5-325 MG  tablet Take 1 tablet by mouth every 6 (six) hours as needed for moderate pain or severe pain. 10 tablet Lurene Shadow, PA-C     I have reviewed the PDMP during this encounter.   Lurene Shadow, [place] 12/12/20 1953

## 2020-12-12 NOTE — ED Triage Notes (Signed)
Pt reports falling and swelling around her ankle. She states she fell this morning when walking down a handicap ramp. Pt states she landed on her ankle and heard something pop. SH states she iced her foot and noticed the swelling went down. Pt states when removing the ice pack the swelling comes back. Pt states the pain has radiated to her knee.

## 2020-12-16 ENCOUNTER — Ambulatory Visit: Payer: Commercial Managed Care - PPO | Admitting: Family Medicine

## 2021-01-01 ENCOUNTER — Ambulatory Visit: Payer: BC Managed Care – PPO | Admitting: Family Medicine

## 2021-02-26 ENCOUNTER — Other Ambulatory Visit: Payer: Self-pay | Admitting: Family Medicine

## 2021-02-26 ENCOUNTER — Ambulatory Visit: Payer: BC Managed Care – PPO | Admitting: Family Medicine

## 2021-03-09 ENCOUNTER — Other Ambulatory Visit: Payer: Self-pay

## 2021-03-09 ENCOUNTER — Ambulatory Visit (INDEPENDENT_AMBULATORY_CARE_PROVIDER_SITE_OTHER): Payer: BC Managed Care – PPO | Admitting: Family Medicine

## 2021-03-09 ENCOUNTER — Encounter: Payer: Self-pay | Admitting: Family Medicine

## 2021-03-09 ENCOUNTER — Telehealth: Payer: Self-pay

## 2021-03-09 VITALS — BP 110/70 | HR 86 | Temp 97.9°F | Ht 64.5 in | Wt 171.0 lb

## 2021-03-09 DIAGNOSIS — F4310 Post-traumatic stress disorder, unspecified: Secondary | ICD-10-CM | POA: Diagnosis not present

## 2021-03-09 DIAGNOSIS — F514 Sleep terrors [night terrors]: Secondary | ICD-10-CM

## 2021-03-09 DIAGNOSIS — Z124 Encounter for screening for malignant neoplasm of cervix: Secondary | ICD-10-CM

## 2021-03-09 DIAGNOSIS — G43809 Other migraine, not intractable, without status migrainosus: Secondary | ICD-10-CM | POA: Diagnosis not present

## 2021-03-09 DIAGNOSIS — R5383 Other fatigue: Secondary | ICD-10-CM | POA: Diagnosis not present

## 2021-03-09 DIAGNOSIS — G43909 Migraine, unspecified, not intractable, without status migrainosus: Secondary | ICD-10-CM | POA: Insufficient documentation

## 2021-03-09 DIAGNOSIS — Z136 Encounter for screening for cardiovascular disorders: Secondary | ICD-10-CM

## 2021-03-09 DIAGNOSIS — M797 Fibromyalgia: Secondary | ICD-10-CM | POA: Diagnosis not present

## 2021-03-09 DIAGNOSIS — Z1211 Encounter for screening for malignant neoplasm of colon: Secondary | ICD-10-CM

## 2021-03-09 DIAGNOSIS — Z1159 Encounter for screening for other viral diseases: Secondary | ICD-10-CM | POA: Diagnosis not present

## 2021-03-09 DIAGNOSIS — Z803 Family history of malignant neoplasm of breast: Secondary | ICD-10-CM | POA: Insufficient documentation

## 2021-03-09 LAB — CBC
HCT: 39.5 % (ref 36.0–46.0)
Hemoglobin: 13.2 g/dL (ref 12.0–15.0)
MCHC: 33.5 g/dL (ref 30.0–36.0)
MCV: 91.1 fl (ref 78.0–100.0)
Platelets: 410 10*3/uL — ABNORMAL HIGH (ref 150.0–400.0)
RBC: 4.34 Mil/uL (ref 3.87–5.11)
RDW: 15.1 % (ref 11.5–15.5)
WBC: 8.9 10*3/uL (ref 4.0–10.5)

## 2021-03-09 LAB — COMPREHENSIVE METABOLIC PANEL
ALT: 10 U/L (ref 0–35)
AST: 17 U/L (ref 0–37)
Albumin: 4.4 g/dL (ref 3.5–5.2)
Alkaline Phosphatase: 121 U/L — ABNORMAL HIGH (ref 39–117)
BUN: 9 mg/dL (ref 6–23)
CO2: 25 mEq/L (ref 19–32)
Calcium: 10.1 mg/dL (ref 8.4–10.5)
Chloride: 108 mEq/L (ref 96–112)
Creatinine, Ser: 0.96 mg/dL (ref 0.40–1.20)
GFR: 70.56 mL/min (ref >60.00)
Glucose, Bld: 94 mg/dL (ref 70–99)
Potassium: 4 mEq/L (ref 3.5–5.1)
Sodium: 141 mEq/L (ref 135–145)
Total Bilirubin: 0.3 mg/dL (ref 0.2–1.2)
Total Protein: 6.9 g/dL (ref 6.0–8.3)

## 2021-03-09 LAB — LIPID PANEL
Cholesterol: 184 mg/dL (ref 0–200)
HDL: 51.1 mg/dL (ref >39.00)
LDL Cholesterol: 120 mg/dL — ABNORMAL HIGH (ref 0–99)
NonHDL: 132.48
Total CHOL/HDL Ratio: 4
Triglycerides: 64 mg/dL (ref 0.0–149.0)
VLDL: 12.8 mg/dL (ref 0.0–40.0)

## 2021-03-09 LAB — TSH: TSH: 2.01 u[IU]/mL (ref 0.35–4.50)

## 2021-03-09 MED ORDER — DULOXETINE HCL 60 MG PO CPEP: 60.0000 mg | ORAL_CAPSULE | Freq: Every day | ORAL | 3 refills | Status: DC

## 2021-03-09 MED ORDER — TOPIRAMATE 50 MG PO TABS: 50.0000 mg | ORAL_TABLET | Freq: Every day | ORAL | 3 refills | Status: DC

## 2021-03-09 MED ORDER — GABAPENTIN 800 MG PO TABS: 800.0000 mg | ORAL_TABLET | Freq: Three times a day (TID) | ORAL | 1 refills | Status: DC

## 2021-03-09 NOTE — Patient Instructions (Addendum)
#  Decrease cymbalta to one pill daily  -- monitor for worsening depression or worsening pain  Please call the location of your choice from the menu below to schedule your Mammogram and/or Bone Density appointment.     [place]  1. [facility]   Phone:  [phone]   [address]                                                                            [place], [place] [address]                                            Services: 3D Mammogram and Bone Density

## 2021-03-09 NOTE — Assessment & Plan Note (Signed)
Improved. Cont topamax and amitriptyline

## 2021-03-09 NOTE — Telephone Encounter (Signed)
[facility] referring for cervical screening .Phone number on file recording an error. Unable to to leave message

## 2021-03-09 NOTE — Assessment & Plan Note (Signed)
Stable. Psychiatrist just stopped practicing. Cont topamax 50 mg, amitriptyline 100 mg, abilify 2 mg. She would like to try a lower dose of duloxetine which is reasonable. Decrease duloxetine 120 mg > 60 mg daily. Return if worsening. Discussed if needing medication adjustments would probably recommend early involvement of psychiatry.

## 2021-03-09 NOTE — Addendum Note (Signed)
Addended by: Aquilla Solian on: 03/09/2021 01:07 PM   Modules accepted: Orders

## 2021-03-09 NOTE — Assessment & Plan Note (Signed)
Will request records. Pt not entirely sure what each of her medications are treating. Suspect topamax 50 mg and amitriptyline 100 mg helping with sleep/night terrors. These are stable, but she does get flares.

## 2021-03-09 NOTE — Assessment & Plan Note (Signed)
Notes daily pain. Some control with gabapentin 800 mg TID. Continue this. Decrease cymbalta 120 mg > 60 mg.

## 2021-03-09 NOTE — Assessment & Plan Note (Signed)
Discussed Maternal aunt diagnosed <50 and her mother and MGM had breast cancer as well. Genetic counseling offered, she will consider. Stressed importance of screening and number for breast center provided.

## 2021-03-09 NOTE — Progress Notes (Signed)
Subjective:     Jordan Zimmerman is a 50 y.o. female presenting for Establish Care     HPI   #PTSD - was previously seeing psychiatry  - trauma was 30 years ago - has night terrors which are worse with stress  - night terrors can cause sleep walking and be severe -   #Fibromylagia  - constant pain - taking gabapentin   Review of Systems   Social History   Tobacco Use  Smoking Status Former Smoker  . Packs/day: 1.00  . Years: 4.00  . Pack years: 4.00  . Types: Cigarettes  . Quit date: 11/29/1994  . Years since quitting: 26.2  Smokeless Tobacco Never Used        Objective:    BP Readings from Last 3 Encounters:  03/09/21 110/70  12/12/20 96/69  08/17/18 113/75   Wt Readings from Last 3 Encounters:  03/09/21 171 lb (77.6 kg)  08/17/18 163 lb (73.9 kg)  06/19/18 170 lb 3.2 oz (77.2 kg)    BP 110/70   Pulse 86   Temp 97.9 F (36.6 C) (Temporal)   Ht 5' 4.5" (1.638 m)   Wt 171 lb (77.6 kg)   LMP 02/14/2021 (Exact Date)   SpO2 100%   BMI 28.90 kg/m    Physical Exam Constitutional:      General: She is not in acute distress.    Appearance: She is well-developed. She is not diaphoretic.  HENT:     Right Ear: External ear normal.     Left Ear: External ear normal.     Nose: Nose normal.  Eyes:     Conjunctiva/sclera: Conjunctivae normal.  Cardiovascular:     Rate and Rhythm: Normal rate and regular rhythm.     Heart sounds: No murmur heard.   Pulmonary:     Effort: Pulmonary effort is normal. No respiratory distress.     Breath sounds: Normal breath sounds. No wheezing.  Musculoskeletal:     Cervical back: Neck supple.  Skin:    General: Skin is warm and dry.     Capillary Refill: Capillary refill takes less than 2 seconds.  Neurological:     Mental Status: She is alert. Mental status is at baseline.  Psychiatric:        Mood and Affect: Mood normal.        Behavior: Behavior normal.    Depression screen [facility] 2/9 03/09/2021 08/17/2018  06/19/2018  Decreased Interest 0 0 1  Down, Depressed, Hopeless 0 0 0  PHQ - 2 Score 0 0 1  Altered sleeping 1 2 -  Tired, decreased energy 3 2 -  Change in appetite 1 0 -  Feeling bad or failure about yourself  1 0 -  Trouble concentrating 1 0 -  Moving slowly or fidgety/restless 0 0 -  Suicidal thoughts 0 0 -  PHQ-9 Score 7 4 -  Difficult doing work/chores Somewhat difficult - -   GAD 7 : Generalized Anxiety Score 03/09/2021 08/17/2018 06/19/2018 07/13/2017  Nervous, Anxious, on Edge 1 0 1 1  Control/stop worrying 1 1 1 1   Worry too much - different things 0 1 1 3   Trouble relaxing 0 1 1 2   Restless 0 0 1 0  Easily annoyed or irritable 0 0 0 0  Afraid - awful might happen 0 0 1 1  Total GAD 7 Score 2 3 6 8   Anxiety Difficulty Somewhat difficult - - -  Assessment & Plan:   Problem List Items Addressed This Visit      Cardiovascular and Mediastinum   Migraines    Improved. Cont topamax and amitriptyline      Relevant Medications   acetaminophen (TYLENOL) 500 MG tablet   amitriptyline (ELAVIL) 100 MG tablet   DULoxetine (CYMBALTA) 60 MG capsule   gabapentin (NEURONTIN) 800 MG tablet   topiramate (TOPAMAX) 50 MG tablet   Other Relevant Orders   Comprehensive metabolic panel   CBC   TSH     Other   Fibromyalgia - Primary (Chronic)    Notes daily pain. Some control with gabapentin 800 mg TID. Continue this. Decrease cymbalta 120 mg > 60 mg.       Relevant Medications   acetaminophen (TYLENOL) 500 MG tablet   amitriptyline (ELAVIL) 100 MG tablet   DULoxetine (CYMBALTA) 60 MG capsule   gabapentin (NEURONTIN) 800 MG tablet   topiramate (TOPAMAX) 50 MG tablet   Other Relevant Orders   Comprehensive metabolic panel   CBC   TSH   PTSD (post-traumatic stress disorder)    Stable. Psychiatrist just stopped practicing. Cont topamax 50 mg, amitriptyline 100 mg, abilify 2 mg. She would like to try a lower dose of duloxetine which is reasonable. Decrease duloxetine  120 mg > 60 mg daily. Return if worsening. Discussed if needing medication adjustments would probably recommend early involvement of psychiatry.       Relevant Medications   amitriptyline (ELAVIL) 100 MG tablet   DULoxetine (CYMBALTA) 60 MG capsule   topiramate (TOPAMAX) 50 MG tablet   Other Relevant Orders   Comprehensive metabolic panel   CBC   TSH   Night terrors, adult    Will request records. Pt not entirely sure what each of her medications are treating. Suspect topamax 50 mg and amitriptyline 100 mg helping with sleep/night terrors. These are stable, but she does get flares.       Relevant Orders   Comprehensive metabolic panel   CBC   TSH   Family history of breast cancer    Discussed Maternal aunt diagnosed <50 and her mother and MGM had breast cancer as well. Genetic counseling offered, she will consider. Stressed importance of screening and number for breast center provided.        Other Visit Diagnoses    Other fatigue       Relevant Orders   Comprehensive metabolic panel   CBC   TSH   Encounter for special screening examination for cardiovascular disorder       Relevant Orders   Lipid panel   Colon cancer screening       Relevant Orders   Fecal occult blood, imunochemical   Need for hepatitis C screening test       Relevant Orders   Hepatitis C antibody   Cervical cancer screening       Relevant Orders   Ambulatory referral to Obstetrics / Gynecology       Return in about 1 year (around 03/09/2022) for or sooner as needed.  Lynnda Child, MD  This visit occurred during the SARS-CoV-2 public health emergency.  Safety protocols were in place, including screening questions prior to the visit, additional usage of staff PPE, and extensive cleaning of exam room while observing appropriate contact time as indicated for disinfecting solutions.

## 2021-03-10 ENCOUNTER — Other Ambulatory Visit: Payer: Self-pay

## 2021-03-10 ENCOUNTER — Encounter: Payer: Self-pay | Admitting: Family Medicine

## 2021-03-10 LAB — HEPATITIS C ANTIBODY
Hepatitis C Ab: NONREACTIVE
SIGNAL TO CUT-OFF: 0.02 (ref <1.00)

## 2021-03-10 MED ORDER — AMITRIPTYLINE HCL 100 MG PO TABS: 100.0000 mg | ORAL_TABLET | Freq: Every day | ORAL | 3 refills | Status: DC

## 2021-04-02 ENCOUNTER — Encounter: Payer: Self-pay | Admitting: Advanced Practice Midwife

## 2021-04-13 ENCOUNTER — Encounter: Payer: Self-pay | Admitting: Advanced Practice Midwife

## 2021-06-15 ENCOUNTER — Encounter: Payer: Self-pay | Admitting: Family Medicine

## 2021-06-15 NOTE — Telephone Encounter (Signed)
Dr Selena Batten, does patient need an appointment to follow up and discuss?

## 2021-06-22 ENCOUNTER — Other Ambulatory Visit: Payer: Self-pay

## 2021-06-22 ENCOUNTER — Ambulatory Visit (INDEPENDENT_AMBULATORY_CARE_PROVIDER_SITE_OTHER): Payer: BC Managed Care – PPO | Admitting: Family Medicine

## 2021-06-22 VITALS — BP 118/70 | HR 98 | Temp 97.9°F | Ht 65.0 in | Wt 176.5 lb

## 2021-06-22 DIAGNOSIS — E663 Overweight: Secondary | ICD-10-CM | POA: Diagnosis not present

## 2021-06-22 DIAGNOSIS — R7989 Other specified abnormal findings of blood chemistry: Secondary | ICD-10-CM | POA: Diagnosis not present

## 2021-06-22 NOTE — Progress Notes (Signed)
Subjective:     Jordan Zimmerman is a 50 y.o. female presenting for Results (Of Ketone levels and wants to discuss going on Keto diet )     HPI  # Concern about diet - has been checking ketone level in the meter - has been testing her ketone level on the diet and she has been higher than the goal   General symptoms  - hard time adjusting the first week - this week muscle spasms - which is told to expect in the second week - is drinking Gatorade and watching her salt intake - with IBS - gets nausea occasionally   Started keto diet 2 weeks ago but is drinking juice if present   Review of Systems  Constitutional:  Negative for chills and fever.  HENT:  Positive for nosebleeds (one time). Negative for rhinorrhea, sinus pressure and sneezing.   Respiratory:  Negative for apnea and shortness of breath.   Cardiovascular:  Negative for chest pain and palpitations.  Gastrointestinal:        Chronic nausea at baseline  Neurological:  Positive for headaches.    Social History   Tobacco Use  Smoking Status Former   Packs/day: 1.00   Years: 4.00   Pack years: 4.00   Types: Cigarettes   Quit date: 11/29/1994   Years since quitting: 26.5  Smokeless Tobacco Never        Objective:    BP Readings from Last 3 Encounters:  06/22/21 118/70  03/09/21 110/70  12/12/20 96/69   Wt Readings from Last 3 Encounters:  06/22/21 176 lb 8 oz (80.1 kg)  03/09/21 171 lb (77.6 kg)  08/17/18 163 lb (73.9 kg)    BP 118/70   Pulse 98   Temp 97.9 F (36.6 C) (Temporal)   Ht 5\' 5"  (1.651 m)   Wt 176 lb 8 oz (80.1 kg)   SpO2 99%   BMI 29.37 kg/m    Physical Exam Constitutional:      General: She is not in acute distress.    Appearance: She is well-developed. She is not diaphoretic.  HENT:     Right Ear: External ear normal.     Left Ear: External ear normal.  Eyes:     Conjunctiva/sclera: Conjunctivae normal.  Cardiovascular:     Rate and Rhythm: Normal rate and regular rhythm.      Heart sounds: No murmur heard. Pulmonary:     Effort: Pulmonary effort is normal. No respiratory distress.     Breath sounds: Normal breath sounds. No wheezing.  Abdominal:     General: Abdomen is flat. There is no distension.     Palpations: Abdomen is soft.     Tenderness: There is no abdominal tenderness. There is no guarding or rebound.  Musculoskeletal:     Cervical back: Neck supple.  Skin:    General: Skin is warm and dry.     Capillary Refill: Capillary refill takes less than 2 seconds.  Neurological:     Mental Status: She is alert. Mental status is at baseline.  Psychiatric:        Mood and Affect: Mood normal.        Behavior: Behavior normal.          Assessment & Plan:   Problem List Items Addressed This Visit       Other   Overweight (BMI 25.0-29.9)    Encouraged well-rounded healthy diet. Pt is interested in keto diet - advised adding complex carbs once  daily to balance. Will also refer to nutritionist for more support.        Relevant Orders   Ambulatory referral to Nutrition and Diabetic Education   Elevated blood ketone body level - Primary    In setting of starting keto diet. Denies symptoms of DKA and no hx of diabetes. Discussed checking hemoglobin a1c, ketone level, and liver/kidney function today. Advised if continuing diet to add complex carb regularly. Nutrition referral for additional support.        Relevant Orders   Comprehensive metabolic panel   POCT urinalysis dipstick   Hemoglobin A1c   Ketones, qualitative   Ambulatory referral to Nutrition and Diabetic Education   I spent >15 minutes with pt , obtaining history, examining, reviewing chart, documenting encounter and discussing the above plan of care.   Return if symptoms worsen or fail to improve.  Lynnda Child, MD  This visit occurred during the SARS-CoV-2 public health emergency.  Safety protocols were in place, including screening questions prior to the visit, additional  usage of staff PPE, and extensive cleaning of exam room while observing appropriate contact time as indicated for disinfecting solutions.

## 2021-06-22 NOTE — Assessment & Plan Note (Signed)
In setting of starting keto diet. Denies symptoms of DKA and no hx of diabetes. Discussed checking hemoglobin a1c, ketone level, and liver/kidney function today. Advised if continuing diet to add complex carb regularly. Nutrition referral for additional support.

## 2021-06-22 NOTE — Assessment & Plan Note (Signed)
Encouraged well-rounded healthy diet. Pt is interested in keto diet - advised adding complex carbs once daily to balance. Will also refer to nutritionist for more support.

## 2021-06-23 LAB — COMPREHENSIVE METABOLIC PANEL
ALT: 6 U/L (ref 0–35)
AST: 13 U/L (ref 0–37)
Albumin: 4.4 g/dL (ref 3.5–5.2)
Alkaline Phosphatase: 67 U/L (ref 39–117)
BUN: 15 mg/dL (ref 6–23)
CO2: 22 mEq/L (ref 19–32)
Calcium: 9.6 mg/dL (ref 8.4–10.5)
Chloride: 108 mEq/L (ref 96–112)
Creatinine, Ser: 0.97 mg/dL (ref 0.40–1.20)
GFR: 69.55 mL/min (ref >60.00)
Glucose, Bld: 92 mg/dL (ref 70–99)
Potassium: 4.1 mEq/L (ref 3.5–5.1)
Sodium: 141 mEq/L (ref 135–145)
Total Bilirubin: 0.3 mg/dL (ref 0.2–1.2)
Total Protein: 7 g/dL (ref 6.0–8.3)

## 2021-06-23 LAB — HEMOGLOBIN A1C: Hgb A1c MFr Bld: 5.4 % (ref 4.6–6.5)

## 2021-06-24 ENCOUNTER — Telehealth: Payer: Self-pay

## 2021-06-24 ENCOUNTER — Other Ambulatory Visit (INDEPENDENT_AMBULATORY_CARE_PROVIDER_SITE_OTHER): Payer: BC Managed Care – PPO

## 2021-06-24 ENCOUNTER — Other Ambulatory Visit: Payer: Self-pay

## 2021-06-24 DIAGNOSIS — Z1211 Encounter for screening for malignant neoplasm of colon: Secondary | ICD-10-CM

## 2021-06-24 DIAGNOSIS — R195 Other fecal abnormalities: Secondary | ICD-10-CM

## 2021-06-24 LAB — FECAL OCCULT BLOOD, IMMUNOCHEMICAL: Fecal Occult Bld: POSITIVE — AB

## 2021-06-24 MED ORDER — CLENPIQ 10-3.5-12 MG-GM -GM/160ML PO SOLN: 320.0000 mL | ORAL | 0 refills | Status: DC

## 2021-06-24 NOTE — Telephone Encounter (Signed)
Patient contacted about results  Referral to [facility] Number to patient

## 2021-06-24 NOTE — Telephone Encounter (Signed)
[facility] called @0820  the critical results  Positive IFOB

## 2021-07-20 ENCOUNTER — Encounter: Payer: Self-pay | Admitting: Gastroenterology

## 2021-07-21 ENCOUNTER — Encounter: Payer: Self-pay | Admitting: Gastroenterology

## 2021-07-21 ENCOUNTER — Ambulatory Visit
Admission: RE | Admit: 2021-07-21 | Discharge: 2021-07-21 | Disposition: A | Discharge status: Home / Self Care | Payer: BC Managed Care – PPO | Attending: Gastroenterology | Admitting: Gastroenterology

## 2021-07-21 ENCOUNTER — Ambulatory Visit: Payer: BC Managed Care – PPO | Admitting: Certified Registered Nurse Anesthetist

## 2021-07-21 ENCOUNTER — Encounter
Admission: RE | Disposition: A | Discharge status: Home / Self Care | Payer: Self-pay | Source: Home / Self Care | Attending: Gastroenterology

## 2021-07-21 DIAGNOSIS — Z8042 Family history of malignant neoplasm of prostate: Secondary | ICD-10-CM | POA: Insufficient documentation

## 2021-07-21 DIAGNOSIS — Z888 Allergy status to other drugs, medicaments and biological substances status: Secondary | ICD-10-CM | POA: Diagnosis not present

## 2021-07-21 DIAGNOSIS — Z833 Family history of diabetes mellitus: Secondary | ICD-10-CM | POA: Diagnosis not present

## 2021-07-21 DIAGNOSIS — Z8249 Family history of ischemic heart disease and other diseases of the circulatory system: Secondary | ICD-10-CM | POA: Insufficient documentation

## 2021-07-21 DIAGNOSIS — Z803 Family history of malignant neoplasm of breast: Secondary | ICD-10-CM | POA: Diagnosis not present

## 2021-07-21 DIAGNOSIS — Z87891 Personal history of nicotine dependence: Secondary | ICD-10-CM | POA: Diagnosis not present

## 2021-07-21 DIAGNOSIS — Z79899 Other long term (current) drug therapy: Secondary | ICD-10-CM | POA: Insufficient documentation

## 2021-07-21 DIAGNOSIS — R195 Other fecal abnormalities: Secondary | ICD-10-CM | POA: Diagnosis not present

## 2021-07-21 DIAGNOSIS — Z880 Allergy status to penicillin: Secondary | ICD-10-CM | POA: Insufficient documentation

## 2021-07-21 HISTORY — PX: COLONOSCOPY: SHX5424

## 2021-07-21 LAB — POCT PREGNANCY, URINE: Preg Test, Ur: NEGATIVE

## 2021-07-21 SURGERY — COLONOSCOPY
Anesthesia: General

## 2021-07-21 MED ORDER — PROPOFOL 10 MG/ML IV BOLUS
INTRAVENOUS | Status: DC | PRN
  Administered 2021-07-21: 60 mg via INTRAVENOUS
  Administered 2021-07-21 (×2): 20 mg via INTRAVENOUS

## 2021-07-21 MED ORDER — DEXMEDETOMIDINE (PRECEDEX) IN NS 20 MCG/5ML (4 MCG/ML) IV SYRINGE
PREFILLED_SYRINGE | INTRAVENOUS | Status: DC | PRN
  Administered 2021-07-21: 4 ug via INTRAVENOUS

## 2021-07-21 MED ORDER — SODIUM CHLORIDE 0.9 % IV SOLN: INTRAVENOUS | Status: DC

## 2021-07-21 MED ORDER — PROPOFOL 500 MG/50ML IV EMUL
INTRAVENOUS | Status: AC
  Filled 2021-07-21: qty 50

## 2021-07-21 MED ORDER — LIDOCAINE HCL (PF) 2 % IJ SOLN
INTRAMUSCULAR | Status: AC
  Filled 2021-07-21: qty 5

## 2021-07-21 MED ORDER — PHENYLEPHRINE HCL (PRESSORS) 10 MG/ML IV SOLN
INTRAVENOUS | Status: DC | PRN
  Administered 2021-07-21: 100 ug via INTRAVENOUS
  Administered 2021-07-21: 150 ug via INTRAVENOUS
  Administered 2021-07-21: 200 ug via INTRAVENOUS
  Administered 2021-07-21: 150 ug via INTRAVENOUS
  Administered 2021-07-21: 100 ug via INTRAVENOUS

## 2021-07-21 MED ORDER — PROPOFOL 500 MG/50ML IV EMUL
INTRAVENOUS | Status: DC | PRN
  Administered 2021-07-21: 150 ug/kg/min via INTRAVENOUS

## 2021-07-21 MED ORDER — ONDANSETRON HCL 4 MG/2ML IJ SOLN
INTRAMUSCULAR | Status: AC
  Administered 2021-07-21: 4 mg
  Filled 2021-07-21: qty 2

## 2021-07-21 MED ORDER — LIDOCAINE HCL (CARDIAC) PF 100 MG/5ML IV SOSY
PREFILLED_SYRINGE | INTRAVENOUS | Status: DC | PRN
  Administered 2021-07-21: 50 mg via INTRAVENOUS

## 2021-07-21 MED ORDER — GLYCOPYRROLATE 0.2 MG/ML IJ SOLN
INTRAMUSCULAR | Status: AC
  Filled 2021-07-21: qty 1

## 2021-07-21 NOTE — Op Note (Signed)
[facility] Gastroenterology Patient Name: Jordan Zimmerman Procedure Date: 07/21/2021 10:32 AM MRN: [id number] Account #: [id number] Date of Birth: 09/18/1971 Admit Type: Outpatient Age: 50 Room: [facility] ENDO ROOM 2 Gender: Female Note Status: Finalized Procedure:             Colonoscopy Indications:           Heme positive stool Providers:             [name] MD, MD Referring MD:          Chryl Heck. Selena Batten (Referring MD) Medicines:             Monitored Anesthesia Care Complications:         No immediate complications. Procedure:             Pre-Anesthesia Assessment:                        - Prior to the procedure, a History and Physical was                         performed, and patient medications, allergies and                         sensitivities were reviewed. The patient's tolerance                         of previous anesthesia was reviewed.                        - The risks and benefits of the procedure and the                         sedation options and risks were discussed with the                         patient. All questions were answered and informed                         consent was obtained.                        - ASA Grade Assessment: II - A patient with mild                         systemic disease.                        After obtaining informed consent, the colonoscope was                         passed under direct vision. Throughout the procedure,                         the patient's blood pressure, pulse, and oxygen                         saturations were monitored continuously. The                         Colonoscope was introduced through the anus and  advanced to the the cecum, identified by the                         appendiceal orifice. The colonoscopy was performed                         with ease. The patient tolerated the procedure well.                         The quality of the bowel preparation was  good. Findings:      The perianal and digital rectal examinations were normal.      The entire examined colon appeared normal on direct and retroflexion       views. Impression:            - The entire examined colon is normal on direct and                         retroflexion views.                        - No specimens collected. Recommendation:        - Discharge patient to home (with escort).                        - Resume previous diet.                        - Continue present medications. Procedure Code(s):     --- Professional ---                        [phone], Colonoscopy, flexible; diagnostic, including                         collection of specimen(s) by brushing or washing, when                         performed (separate procedure) Diagnosis Code(s):     --- Professional ---                        R19.5, Other fecal abnormalities CPT copyright 2019 American Medical Association. All rights reserved. The codes documented in this report are preliminary and upon coder review may  be revised to meet current compliance requirements. [name], MD [name] MD, MD 07/21/2021 11:00:15 AM This report has been signed electronically. Number of Addenda: 0 Note Initiated On: 07/21/2021 10:32 AM Scope Withdrawal Time: 0 hours 11 minutes 35 seconds  Total Procedure Duration: 0 hours 14 minutes 45 seconds  Estimated Blood Loss:  Estimated blood loss: none.      [facility]

## 2021-07-21 NOTE — Anesthesia Preprocedure Evaluation (Signed)
Anesthesia Evaluation  Patient identified by MRN, date of birth, ID band Patient awake    Reviewed: Allergy & Precautions, NPO status , Patient's Chart, lab work & pertinent test results  Airway Mallampati: II  TM Distance: >3 FB Neck ROM: Full    Dental  (+) Poor Dentition, Loose,    Pulmonary neg pulmonary ROS, former smoker,    Pulmonary exam normal        Cardiovascular negative cardio ROS Normal cardiovascular exam     Neuro/Psych  Headaches, PSYCHIATRIC DISORDERS Anxiety Depression  Neuromuscular disease    GI/Hepatic negative GI ROS, Neg liver ROS, Bowel prep,  Endo/Other  negative endocrine ROS  Renal/GU Renal disease  negative genitourinary   Musculoskeletal  (+) Fibromyalgia -  Abdominal   Peds negative pediatric ROS (+)  Hematology negative hematology ROS (+)   Anesthesia Other Findings Anxiety    Chronic fatigue    Fibromyalgia    History of kidney stones   IBS (irritable bowel syndrome)  Kidney stones    Migraines       Reproductive/Obstetrics negative OB ROS                            Anesthesia Physical Anesthesia Plan  ASA: 2  Anesthesia Plan: General   Post-op Pain Management:    Induction: Intravenous  PONV Risk Score and Plan: 2 and Propofol infusion and TIVA  Airway Management Planned: Natural Airway and Nasal Cannula  Additional Equipment:   Intra-op Plan:   Post-operative Plan:   Informed Consent: I have reviewed the patients History and Physical, chart, labs and discussed the procedure including the risks, benefits and alternatives for the proposed anesthesia with the patient or authorized representative who has indicated his/her understanding and acceptance.       Plan Discussed with: CRNA, Anesthesiologist and Surgeon  Anesthesia Plan Comments:         Anesthesia Quick Evaluation

## 2021-07-21 NOTE — Transfer of Care (Signed)
Immediate Anesthesia Transfer of Care Note  Patient: Jordan Zimmerman  Procedure(s) Performed: COLONOSCOPY  Patient Location: PACU  Anesthesia Type:General  Level of Consciousness: drowsy  Airway & Oxygen Therapy: Patient Spontanous Breathing  Post-op Assessment: Report given to RN and Post -op Vital signs reviewed and stable  Post vital signs: Reviewed and stable  Last Vitals:  Vitals Value Taken Time  BP 96/53 07/21/21 1104  Temp    Pulse 73 07/21/21 1104  Resp 22 07/21/21 1104  SpO2 99 % 07/21/21 1104  Vitals shown include unvalidated device data.  Last Pain:  Vitals:   07/21/21 1004  TempSrc: Tympanic  PainSc: 6          Complications: No notable events documented.

## 2021-07-21 NOTE — H&P (Addendum)
Jordan Mood, MD [address], [address], [place], [place], [address] [address], [address], [place], [place], [address] Phone: [phone]  Fax: [phone]  Primary Care Physician:  Jordan Child, MD   Pre-Procedure History & Physical: HPI:  Jordan Zimmerman is a 50 y.o. female is here for an colonoscopy.   Past Medical History:  Diagnosis Date   Anxiety    Chronic fatigue    Fibromyalgia    History of kidney stones    IBS (irritable bowel syndrome)    Kidney stones    Migraines     Past Surgical History:  Procedure Laterality Date   CHOLECYSTECTOMY  2001   CYSTOSCOPY W/ URETEROSCOPY  ~ 2008   "pulled stone rest of the way out" (05/21/2013)   EXTRACORPOREAL SHOCK WAVE LITHOTRIPSY Right 10/06/2017   Procedure: RIGHT EXTRACORPOREAL SHOCK WAVE LITHOTRIPSY (ESWL);  Surgeon: Jordan Pippin, MD;  Location: [facility]S;  Service: Urology;  Laterality: Right;   FOOT FRACTURE SURGERY Bilateral    TONSILLECTOMY  1992    Prior to Admission medications   Medication Sig Start Date End Date Taking? Authorizing Provider  amitriptyline (ELAVIL) 100 MG tablet Take 1 tablet (100 mg total) by mouth at bedtime. 03/10/21  Yes Jordan Child, MD  DULoxetine (CYMBALTA) 60 MG capsule Take 1 capsule (60 mg total) by mouth daily. 03/09/21  Yes Jordan Child, MD  gabapentin (NEURONTIN) 800 MG tablet Take 1 tablet (800 mg total) by mouth 3 (three) times daily. 03/09/21  Yes Jordan Child, MD  topiramate (TOPAMAX) 50 MG tablet Take 1 tablet (50 mg total) by mouth at bedtime. 03/09/21  Yes Jordan Child, MD  acetaminophen (TYLENOL) 500 MG tablet Take 1,000 mg by mouth every 6 (six) hours as needed.    [provider]  Sod Picosulfate-Mag Ox-Cit Acd (CLENPIQ) 10-3.5-12 MG-GM -GM/160ML SOLN Take 320 mLs by mouth as directed. 06/24/21   Jordan Mood, MD    Allergies as of 06/24/2021 - Review Complete 03/09/2021  Allergen Reaction Noted   Ambien [zolpidem tartrate] Anaphylaxis 04/04/2013    Amoxicillin Hives and Swelling 04/04/2013   Penicillins Hives 04/04/2013    Family History  Problem Relation Age of Onset   Diabetes Mother    Hypertension Mother    Breast cancer Mother    Hypertension Father    Diabetes Father    Prostate cancer Father    Breast cancer Maternal Aunt 72   Breast cancer Maternal Grandmother     Social History   Socioeconomic History   Marital status: Married    Spouse name: Jordan Zimmerman   Number of children: 2   Years of education: high school   Highest education level: Not on file  Occupational History   Not on file  Tobacco Use   Smoking status: Former    Packs/day: 1.00    Years: 4.00    Pack years: 4.00    Types: Cigarettes    Quit date: 11/29/1994    Years since quitting: 26.6   Smokeless tobacco: Never  Vaping Use   Vaping Use: Former  Substance and Sexual Activity   Alcohol use: No    Comment: occas   Drug use: Yes    Types: Marijuana    Comment: <1 week    Sexual activity: Yes    Birth control/protection: Surgical  Other Topics Concern   Not on file  Social History Narrative   03/09/21   From: the area   Living: with husband,  Jordan Zimmerman (1994) and daughter   Work: homemaker      Family: Jordan Zimmerman and Psychiatric nurse (married and lives nearby) - 2 grandchildren      Enjoys: garden      Exercise: pool in the weather is nice, walking   Diet: IBS limits her options       Safety   Seat belts: Yes    Guns: Yes  and secure   Safe in relationships: Yes    Social Determinants of Corporate investment banker Strain: Not on file  Food Insecurity: Not on file  Transportation Needs: Not on file  Physical Activity: Not on file  Stress: Not on file  Social Connections: Not on file  Intimate Partner Violence: Not on file    Review of Systems: See HPI, otherwise negative ROS  Physical Exam: BP (!) 117/92   Pulse 89   Temp (!) 97.3 F (36.3 C) (Tympanic)   Resp 18   Ht 5\' 5"  (1.651 m)   Wt 77.6 kg   SpO2 100%   BMI 28.46 kg/m   General:   Alert,  pleasant and cooperative in NAD Head:  Normocephalic and atraumatic. Neck:  Supple; no masses or thyromegaly. Lungs:  Clear throughout to auscultation, normal respiratory effort.    Heart:  +S1, +S2, Regular rate and rhythm, No edema. Abdomen:  Soft, nontender and nondistended. Normal bowel sounds, without guarding, and without rebound.   Neurologic:  Alert and  oriented x4;  grossly normal neurologically.  Impression/Plan: Jordan Zimmerman is here for an colonoscopy to be performed for positive stool test  Risks, benefits, limitations, and alternatives regarding  colonoscopy have been reviewed with the patient.  Questions have been answered.  All parties agreeable.   [name], MD  07/21/2021, 10:31 AM

## 2021-07-21 NOTE — Anesthesia Postprocedure Evaluation (Signed)
Anesthesia Post Note  Patient: Jordan Zimmerman  Procedure(s) Performed: COLONOSCOPY  Patient location during evaluation: Phase II Anesthesia Type: General Level of consciousness: awake and alert, awake and oriented Pain management: pain level controlled Vital Signs Assessment: post-procedure vital signs reviewed and stable Respiratory status: spontaneous breathing, nonlabored ventilation and respiratory function stable Cardiovascular status: blood pressure returned to baseline and stable Postop Assessment: no apparent nausea or vomiting Anesthetic complications: no   No notable events documented.   Last Vitals:  Vitals:   07/21/21 1100 07/21/21 1110  BP: (!) 96/53 100/65  Pulse: 75 73  Resp: 20 20  Temp: (!) 36.3 C   SpO2: 100% 98%    Last Pain:  Vitals:   07/21/21 1100  TempSrc: Tympanic  PainSc:                  Manfred Arch

## 2021-07-22 ENCOUNTER — Encounter: Payer: Self-pay | Admitting: Gastroenterology

## 2021-10-05 ENCOUNTER — Encounter: Payer: Self-pay | Admitting: Family Medicine

## 2021-10-05 ENCOUNTER — Other Ambulatory Visit: Payer: Self-pay

## 2021-10-05 ENCOUNTER — Ambulatory Visit: Payer: BC Managed Care – PPO | Admitting: Family Medicine

## 2021-10-05 ENCOUNTER — Ambulatory Visit (INDEPENDENT_AMBULATORY_CARE_PROVIDER_SITE_OTHER): Payer: BC Managed Care – PPO | Admitting: Family Medicine

## 2021-10-05 VITALS — BP 124/80 | HR 115 | Temp 97.0°F | Ht 65.0 in | Wt 157.4 lb

## 2021-10-05 DIAGNOSIS — S39012A Strain of muscle, fascia and tendon of lower back, initial encounter: Secondary | ICD-10-CM | POA: Diagnosis not present

## 2021-10-05 DIAGNOSIS — G43809 Other migraine, not intractable, without status migrainosus: Secondary | ICD-10-CM | POA: Diagnosis not present

## 2021-10-05 DIAGNOSIS — G4452 New daily persistent headache (NDPH): Secondary | ICD-10-CM | POA: Insufficient documentation

## 2021-10-05 MED ORDER — SUMATRIPTAN SUCCINATE 25 MG PO TABS: 25.0000 mg | ORAL_TABLET | ORAL | 0 refills | Status: DC | PRN

## 2021-10-05 MED ORDER — CYCLOBENZAPRINE HCL 5 MG PO TABS: 5.0000 mg | ORAL_TABLET | Freq: Three times a day (TID) | ORAL | 1 refills | Status: DC | PRN

## 2021-10-05 MED ORDER — ONDANSETRON 4 MG PO TBDP: 4.0000 mg | ORAL_TABLET | Freq: Three times a day (TID) | ORAL | 0 refills | Status: DC | PRN

## 2021-10-05 NOTE — Telephone Encounter (Signed)
Called patient and got her scheduled for today @240 

## 2021-10-05 NOTE — Assessment & Plan Note (Signed)
Pt notes new worsening frequency of migraines - already on amitriptyline and topiramate. Has never had sumatriptan so will try that for abortive treatment as not responding to high dose tylenol/ibuprofen. Discussed treating at onset of HA. Will also do zofran for nausea.

## 2021-10-05 NOTE — Patient Instructions (Addendum)
Back pain - try flexeril -- may make you sleepy - use with caution with gabapentin and amitriptyline  Headache - Try to take tylenol and ibuprofen - can take with zofran - Sumatriptan - take 25 mg and can take again 2 hours later ---- if not helpful would not take again  #Referral I have placed a referral to a specialist for you. You should receive a phone call from the specialty office. Make sure your voicemail is not full and that if you are able to answer your phone to unknown or new numbers.   It may take up to 2 weeks to hear about the referral. If you do not hear anything in 2 weeks, please call our office and ask to speak with the referral coordinator.

## 2021-10-05 NOTE — Progress Notes (Signed)
Subjective:     Jordan Zimmerman is a 50 y.o. female presenting for Migraine (4 in the past week ), Nausea, and Back Pain     Migraine  This is a new problem. Episode onset: 4 in the last week. The pain is located in the Occipital region. Radiates to: right eyebrow. The pain quality is similar to prior headaches. The quality of the pain is described as pulsating. The pain is at a severity of 8/10. Associated symptoms include back pain, blurred vision, nausea, numbness (finger), phonophobia, photophobia, tinnitus and vomiting. Pertinent negatives include no coughing, sinus pressure, tingling or weakness. The symptoms are aggravated by emotional stress (stress). Treatments tried: pressure point - for nausea, magnesium pills, green tea.  Back Pain This is a new problem. Episode onset: 09/30/2021. The problem occurs constantly. The pain is present in the lumbar spine and thoracic spine. The quality of the pain is described as stabbing. Radiates to: into bilateral hips. The pain is at a severity of 8/10. The pain is The same all the time. Associated symptoms include numbness (finger). Pertinent negatives include no bladder incontinence, bowel incontinence, tingling or weakness. She has tried bed rest, NSAIDs, analgesics, heat and ice for the symptoms. The treatment provided mild relief.   Had to put down her emotional support dog  Alternates tylenol/ibuprofen - but getting stomach upset - will take something every 4-6 hours while awake - is waking up with nausea/vomiting and severe pain - HA in the AM 3/4 times   Was never on abortive therapy  Review of Systems  HENT:  Positive for tinnitus. Negative for sinus pressure.   Eyes:  Positive for blurred vision and photophobia.  Respiratory:  Negative for cough.   Gastrointestinal:  Positive for nausea and vomiting. Negative for bowel incontinence.  Genitourinary:  Negative for bladder incontinence.  Musculoskeletal:  Positive for back pain.   Neurological:  Positive for numbness (finger). Negative for tingling and weakness.    Social History   Tobacco Use  Smoking Status Former   Packs/day: 1.00   Years: 4.00   Pack years: 4.00   Types: Cigarettes   Quit date: 11/29/1994   Years since quitting: 26.8  Smokeless Tobacco Never        Objective:    BP Readings from Last 3 Encounters:  10/05/21 124/80  07/21/21 105/64  06/22/21 118/70   Wt Readings from Last 3 Encounters:  10/05/21 157 lb 6 oz (71.4 kg)  07/21/21 171 lb (77.6 kg)  06/22/21 176 lb 8 oz (80.1 kg)    BP 124/80   Pulse (!) 115   Temp (!) 97 F (36.1 C) (Temporal)   Ht 5\' 5"  (1.651 m)   Wt 157 lb 6 oz (71.4 kg)   LMP 09/14/2021 (Exact Date)   SpO2 98%   BMI 26.19 kg/m    Physical Exam Constitutional:      General: She is not in acute distress.    Appearance: She is well-developed. She is not diaphoretic.  HENT:     Right Ear: External ear normal.     Left Ear: External ear normal.  Eyes:     Conjunctiva/sclera: Conjunctivae normal.  Cardiovascular:     Rate and Rhythm: Normal rate and regular rhythm.     Heart sounds: No murmur heard. Pulmonary:     Effort: Pulmonary effort is normal. No respiratory distress.     Breath sounds: Normal breath sounds. No wheezing.  Musculoskeletal:  General: Normal range of motion.     Cervical back: Neck supple.     Comments: Back Inspection: no abnormality Palpation: TTP along spine and paraspinous, worse at the lumbar area ROM: normal, but with some discomfort Strength normal  Skin:    General: Skin is warm and dry.     Capillary Refill: Capillary refill takes less than 2 seconds.  Neurological:     General: No focal deficit present.     Mental Status: She is alert. Mental status is at baseline.     Cranial Nerves: No cranial nerve deficit.     Motor: No weakness.     Gait: Gait normal.  Psychiatric:        Mood and Affect: Mood normal.        Behavior: Behavior normal.           Assessment & Plan:   Problem List Items Addressed This Visit       Cardiovascular and Mediastinum   Migraines    Pt notes new worsening frequency of migraines - already on amitriptyline and topiramate. Has never had sumatriptan so will try that for abortive treatment as not responding to high dose tylenol/ibuprofen. Discussed treating at onset of HA. Will also do zofran for nausea.       Relevant Medications   SUMAtriptan (IMITREX) 25 MG tablet   cyclobenzaprine (FLEXERIL) 5 MG tablet   Other Relevant Orders   Ambulatory referral to Neurology   MR Brain Wo Contrast     Musculoskeletal and Integument   Low back strain    Reassuring exam. Suspect muscle strain. Advised trial of flexeril (use with caution with other medication). Suspect this may be what is triggering HA symptoms but unclear.         Other   New daily persistent headache - Primary    Red flag - AM HA with N/V. Neuro exam reassuring but given change in HA will plan for MRI and neuro referral as symptomatic in setting of suppressive medication. Though discussed if back pain improves and HA resolves may consider postponing.       Relevant Medications   ondansetron (ZOFRAN ODT) 4 MG disintegrating tablet   SUMAtriptan (IMITREX) 25 MG tablet   cyclobenzaprine (FLEXERIL) 5 MG tablet   Other Relevant Orders   Ambulatory referral to Neurology   MR Brain Wo Contrast     Return if symptoms worsen or fail to improve.  Lynnda Child, MD  This visit occurred during the SARS-CoV-2 public health emergency.  Safety protocols were in place, including screening questions prior to the visit, additional usage of staff PPE, and extensive cleaning of exam room while observing appropriate contact time as indicated for disinfecting solutions.

## 2021-10-05 NOTE — Assessment & Plan Note (Signed)
Red flag - AM HA with N/V. Neuro exam reassuring but given change in HA will plan for MRI and neuro referral as symptomatic in setting of suppressive medication. Though discussed if back pain improves and HA resolves may consider postponing.

## 2021-10-05 NOTE — Assessment & Plan Note (Signed)
Reassuring exam. Suspect muscle strain. Advised trial of flexeril (use with caution with other medication). Suspect this may be what is triggering HA symptoms but unclear.

## 2021-10-14 ENCOUNTER — Encounter: Payer: Self-pay | Admitting: *Deleted

## 2021-10-19 ENCOUNTER — Encounter: Payer: Self-pay | Admitting: Psychiatry

## 2021-10-19 ENCOUNTER — Ambulatory Visit (INDEPENDENT_AMBULATORY_CARE_PROVIDER_SITE_OTHER): Payer: BC Managed Care – PPO | Admitting: Psychiatry

## 2021-10-19 VITALS — BP 118/66 | HR 103 | Ht 65.0 in | Wt 150.0 lb

## 2021-10-19 DIAGNOSIS — G43719 Chronic migraine without aura, intractable, without status migrainosus: Secondary | ICD-10-CM

## 2021-10-19 MED ORDER — SUMATRIPTAN SUCCINATE 50 MG PO TABS: 50.0000 mg | ORAL_TABLET | ORAL | 2 refills | Status: DC | PRN

## 2021-10-19 MED ORDER — PROPRANOLOL HCL 20 MG PO TABS: 20.0000 mg | ORAL_TABLET | Freq: Two times a day (BID) | ORAL | 2 refills | Status: DC

## 2021-10-19 NOTE — Progress Notes (Signed)
Referring:  Lynnda Child, MD [address] [place],  [place] [address]  PCP: Lynnda Child, MD  Neurology was asked to evaluate Tanasha Menees, a 50 year old female for a chief complaint of headaches.  Our recommendations of care will be communicated by shared medical record.    CC:  headaches  HPI:  Medical co-morbidities: fibromyalgia, depression, kidney stones  The patient presents for evaluation of headaches which began several years ago. The past month her migraines have been more severe. She injured her back around this time and thinks this may have triggered the worsening of her headaches. Service dog also passed away around this time. They have been slightly better this past week. Prior to that would have a migraine every other day. She does have lower level headaches on the days she does not have migraines. Migraines are described as frontal throbbing with associated photophobia, phonophobia, nausea, and vomiting.  Saw her PCP who ordered MRI brain but this hasn't been scheduled yet. Also prescribed Imitrex 25 mg, zofran 4 mg, and flexeril 5 mg PRN. Imitrex worked but would have to take a second dose after 8 hours because headache would return. Takes Zofran as needed for nausea. She has thrown up her rescue pills before. Takes flexeril for back spasms.  Takes Topamax and amitriptyline for headache prevention which she has been on for 2-3 years. Amitriptyline helps with sleep but she does not feel that Topamax has been particularly helpful.  Headache History: Onset: several years ago Triggers: stress Aura: no Location: right frontal or bifrontal Quality/Description: throbbing Associated Symptoms:  Photophobia: yes  Phonophobia: yes  Nausea: yes Vomiting: yes Worse with activity?: yes Duration of headaches: 1 hour with Imitrex  Headache days per month: 30 Headache free days per month: 0  Current Treatment: Abortive Imitrex 25 mg  PRN Zofran  Preventative Amitriptyline 100 mg QHS Topamax 50 mg QHS  Prior Therapies                                 Amitriptyline 100 mg QHS Topamax 50 mg QHS Cymbalta 60 mg daily Gabapentin 800 mg TID Imitrex 25 mg PRN Zofran Flexeril  Headache Risk Factors: Headache risk factors and/or co-morbidities (+) Neck Pain (+) Back Pain (+) Sleep Disorder - insomnia (-) Obesity  Body mass index is 24.96 kg/m. (-) History of Traumatic Brain Injury and/or Concussion  LABS: 06/22/21: CMP wnl 03/09/21: CBC, TSH wnl  IMAGING:  none   Current Outpatient Medications on File Prior to Visit  Medication Sig Dispense Refill   acetaminophen (TYLENOL) 500 MG tablet Take 1,000 mg by mouth every 6 (six) hours as needed.     amitriptyline (ELAVIL) 100 MG tablet Take 1 tablet (100 mg total) by mouth at bedtime. 90 tablet 3   cyclobenzaprine (FLEXERIL) 5 MG tablet Take 1 tablet (5 mg total) by mouth 3 (three) times daily as needed for muscle spasms. 30 tablet 1   DULoxetine (CYMBALTA) 60 MG capsule Take 1 capsule (60 mg total) by mouth daily. 90 capsule 3   gabapentin (NEURONTIN) 800 MG tablet Take 1 tablet (800 mg total) by mouth 3 (three) times daily. 270 tablet 1   ondansetron (ZOFRAN ODT) 4 MG disintegrating tablet Take 1 tablet (4 mg total) by mouth every 8 (eight) hours as needed for nausea or vomiting. 20 tablet 0   Sod Picosulfate-Mag Ox-Cit Acd (CLENPIQ) 10-3.5-12 MG-GM -GM/160ML SOLN  Take 320 mLs by mouth as directed. 320 mL 0   topiramate (TOPAMAX) 50 MG tablet Take 1 tablet (50 mg total) by mouth at bedtime. 90 tablet 3   No current facility-administered medications on file prior to visit.     Allergies: Allergies  Allergen Reactions   Ambien [Zolpidem Tartrate] Anaphylaxis   Amoxicillin Hives and Swelling    Fever.    Penicillins Hives    High fever    Family History: Migraine or other headaches in the family:  no Aneurysms in a first degree relative:  no Brain tumors  in the family:  no Other neurological illness in the family:   no  Past Medical History: Past Medical History:  Diagnosis Date   Anxiety    Chronic fatigue    Fibromyalgia    History of kidney stones    IBS (irritable bowel syndrome)    Kidney stones    Migraines     Past Surgical History Past Surgical History:  Procedure Laterality Date   CHOLECYSTECTOMY  2001   COLONOSCOPY N/A 07/21/2021   Procedure: COLONOSCOPY;  Surgeon: [name], MD;  Location: [facility] ENDOSCOPY;  Service: Gastroenterology;  Laterality: N/A;   CYSTOSCOPY W/ URETEROSCOPY  ~ 2008   "pulled stone rest of the way out" (05/21/2013)   EXTRACORPOREAL SHOCK WAVE LITHOTRIPSY Right 10/06/2017   Procedure: RIGHT EXTRACORPOREAL SHOCK WAVE LITHOTRIPSY (ESWL);  Surgeon: Bjorn Pippin, MD;  Location: [facility] ORS;  Service: Urology;  Laterality: Right;   FOOT FRACTURE SURGERY Bilateral    TONSILLECTOMY  1992    Social History: Social History   Tobacco Use   Smoking status: Former    Packs/day: 1.00    Years: 4.00    Pack years: 4.00    Types: Cigarettes    Quit date: 11/29/1994    Years since quitting: 26.9   Smokeless tobacco: Never  Vaping Use   Vaping Use: Former  Substance Use Topics   Alcohol use: No    Comment: occas   Drug use: Yes    Types: Marijuana    Comment: <1 week     ROS: Negative for fevers, chills. Positive for headaches. All other systems reviewed and negative unless stated otherwise in HPI.   Physical Exam:   Vital Signs: BP 118/66   Pulse (!) 103   Ht 5\' 5"  (1.651 m)   Wt 150 lb (68 kg)   SpO2 99%   BMI 24.96 kg/m  GENERAL: well appearing,in no acute distress,alert SKIN:  Color, texture, turgor normal. No rashes or lesions HEAD:  Normocephalic/atraumatic. CV:  RRR RESP: Normal respiratory effort MSK: +tenderness to palpation over bilateral occiput, neck, and shoulders  NEUROLOGICAL: Mental Status: Alert, oriented to person, place and time,Follows commands Cranial Nerves:  PERRL,visual fields intact to confrontation,extraocular movements intact,facial sensation intact,no facial droop or ptosis,hearing intact to finger rub bilaterally,no dysarthria,palate elevate symmetrically,tongue protrudes midline,shoulder shrug intact and symmetric Motor: muscle strength 5/5 both upper and lower extremities,no drift, normal tone Reflexes: 2+ throughout Sensation: intact to light touch all 4 extremities Coordination: Finger-to- nose-finger intact bilaterally Gait: normal-based   IMPRESSION: 50 year old female with a history of kidney stones, fibromyalgia, and depression who presents for evaluation of migraines. Her current headache pattern is consistent with chronic migraine. Discussed treatment options including CGRP and botox, however the patient has a syncopal reaction to needles and would not be able to tolerate injections. Will add propranolol for headache prevention. Will wean off of Topamax as it has not been effective  and she has a history of kidney stones. Will increase Imitrex to 50 mg PRN for rescue, can increase further if needed.  PLAN: -Preventive: Start propranolol 20 mg BID. Wean off Topamax (25 mg QHS for one week, then stop) -Rescue: Increase Imitrex to 50 mg PRN, can increase up to 100 mg PRN if needed -Next steps: consider qulipta for prevention, consider dissolvable tablet or nasal formulation of triptan if continues to have issues with throwing up rescue pills   I spent a total of 38 minutes chart reviewing and counseling the patient. Headache education was done. Discussed treatment options including preventive and acute medications, natural supplements, and physical therapy. Discussed medication overuse headache and to limit use of acute treatments to no more than 2 days/week or 10 days/month. Discussed medication side effects, adverse reactions and drug interactions. Written educational materials and patient instructions outlining all of the above were  given.  Follow-up: 3 months   Ocie Doyne, MD 10/19/2021   3:51 PM

## 2021-10-19 NOTE — Patient Instructions (Signed)
Start propranolol 20 mg twice a day for migraine prevention Increase sumatriptan to 50 mg as needed. Take at the onset of migraine. If headache recurs or does not fully resolve, you may take a second dose after 2 hours. Please avoid taking more than 2 days per week or 10 days per month. Take 1/2 tablet of Topamax for one week, then stop   MRI order sent to [facility]  They will call you to schedule or You may reach out to them to schedule your appointment at your convenience.    They have two locations in [place] and one in [place].  [address] [place]   They can be reached at [phone]

## 2021-11-02 ENCOUNTER — Other Ambulatory Visit: Payer: BC Managed Care – PPO

## 2021-11-09 ENCOUNTER — Other Ambulatory Visit: Payer: Self-pay

## 2021-11-09 ENCOUNTER — Ambulatory Visit
Admission: RE | Admit: 2021-11-09 | Discharge: 2021-11-09 | Disposition: A | Discharge status: Home / Self Care | Payer: BC Managed Care – PPO | Source: Ambulatory Visit | Attending: Family Medicine | Admitting: Family Medicine

## 2021-11-09 DIAGNOSIS — G4452 New daily persistent headache (NDPH): Secondary | ICD-10-CM

## 2021-11-09 DIAGNOSIS — G43809 Other migraine, not intractable, without status migrainosus: Secondary | ICD-10-CM

## 2021-11-09 DIAGNOSIS — R519 Headache, unspecified: Secondary | ICD-10-CM | POA: Diagnosis not present

## 2021-11-10 ENCOUNTER — Other Ambulatory Visit: Payer: Self-pay | Admitting: Psychiatry

## 2021-11-10 DIAGNOSIS — G4452 New daily persistent headache (NDPH): Secondary | ICD-10-CM

## 2021-11-11 ENCOUNTER — Telehealth: Payer: Self-pay | Admitting: Psychiatry

## 2021-11-11 NOTE — Telephone Encounter (Signed)
Referral to ophthalmology sent to [facility] in [place]. Phone: [phone].

## 2021-11-19 ENCOUNTER — Other Ambulatory Visit: Payer: Self-pay | Admitting: Gastroenterology

## 2021-11-19 ENCOUNTER — Other Ambulatory Visit: Payer: Self-pay | Admitting: Family Medicine

## 2021-11-19 DIAGNOSIS — G4452 New daily persistent headache (NDPH): Secondary | ICD-10-CM

## 2021-12-03 ENCOUNTER — Encounter: Payer: Self-pay | Admitting: Psychiatry

## 2021-12-11 ENCOUNTER — Other Ambulatory Visit: Payer: Self-pay

## 2021-12-11 ENCOUNTER — Other Ambulatory Visit: Payer: Self-pay | Admitting: Family Medicine

## 2021-12-11 ENCOUNTER — Encounter: Payer: Self-pay | Admitting: Family Medicine

## 2021-12-11 ENCOUNTER — Ambulatory Visit (INDEPENDENT_AMBULATORY_CARE_PROVIDER_SITE_OTHER): Payer: BC Managed Care – PPO | Admitting: Family Medicine

## 2021-12-11 VITALS — BP 120/70 | HR 89 | Temp 98.0°F | Ht 65.0 in | Wt 145.0 lb

## 2021-12-11 DIAGNOSIS — R55 Syncope and collapse: Secondary | ICD-10-CM

## 2021-12-11 DIAGNOSIS — G4452 New daily persistent headache (NDPH): Secondary | ICD-10-CM

## 2021-12-11 DIAGNOSIS — G43809 Other migraine, not intractable, without status migrainosus: Secondary | ICD-10-CM | POA: Diagnosis not present

## 2021-12-11 MED ORDER — ONDANSETRON 4 MG PO TBDP: 4.0000 mg | ORAL_TABLET | Freq: Three times a day (TID) | ORAL | 0 refills | Status: DC | PRN

## 2021-12-11 MED ORDER — PROPRANOLOL HCL 20 MG PO TABS: 40.0000 mg | ORAL_TABLET | Freq: Two times a day (BID) | ORAL | 2 refills | Status: DC

## 2021-12-11 NOTE — Patient Instructions (Addendum)
Increase propranolol to 40 mg twice daily for prevention, check hert rate an dpusle at home goal BP  90-140/ 60-90 and rate 60-90.  Use zofran as needed.  Call to follow up with neurology.
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
## 2021-12-11 NOTE — Telephone Encounter (Signed)
See pt message for neurology on 12/03/21; on 12/03/21 pt had blacked out completely;pt had 2 migraines that wk. Dr Delena Bali wanted pt to be eval by PCP. I spoke with pts husband now on 12/11/21(DPR signed) and in last 2 wks pt has had 6 - 7 migraines and when pt  has migraine she has N&V. Last H/A and vomiting ws 12/10/21; pt took last ondansetron on 12/10/21.pt has only had the one black out episode on 12/03/21 but due to neurology wanting pt seen by PCP pt scheduled appt at [facility] with Dr Ermalene Searing 12/11/21 at 3:20. Sending note to Dr Ermalene Searing and Lupita Leash CMA.

## 2021-12-29 ENCOUNTER — Telehealth: Payer: Self-pay | Admitting: Psychiatry

## 2021-12-29 DIAGNOSIS — R55 Syncope and collapse: Secondary | ICD-10-CM | POA: Insufficient documentation

## 2021-12-29 NOTE — Telephone Encounter (Signed)
Sent Mychart message regarding 2 February appointments

## 2021-12-29 NOTE — Assessment & Plan Note (Signed)
Normal neuro exam. Likely post emesis and due to severe migraine at the time. Has happened in setting before.

## 2021-12-29 NOTE — Assessment & Plan Note (Signed)
Normal neuro exam.  Currently followed by neurology and I asked pt to make them aware of continued issues.  In meantime.. willl increase propranolol to  for migraine prevention.  use zofran for nausea.

## 2022-01-05 ENCOUNTER — Ambulatory Visit: Payer: BC Managed Care – PPO | Admitting: Psychiatry

## 2022-01-13 ENCOUNTER — Other Ambulatory Visit: Payer: Self-pay | Admitting: Family Medicine

## 2022-01-13 DIAGNOSIS — F4310 Post-traumatic stress disorder, unspecified: Secondary | ICD-10-CM

## 2022-01-13 DIAGNOSIS — M797 Fibromyalgia: Secondary | ICD-10-CM

## 2022-01-16 ENCOUNTER — Other Ambulatory Visit: Payer: Self-pay | Admitting: Psychiatry

## 2022-01-16 ENCOUNTER — Other Ambulatory Visit: Payer: Self-pay | Admitting: Family Medicine

## 2022-01-16 ENCOUNTER — Other Ambulatory Visit: Payer: Self-pay | Admitting: Gastroenterology

## 2022-01-16 DIAGNOSIS — G4452 New daily persistent headache (NDPH): Secondary | ICD-10-CM

## 2022-01-19 NOTE — Telephone Encounter (Signed)
Last filled: Amitryptyline 03/10/21 #90 with 3 refill, Sumatriptan last refilled by neurologist Ocie Doyne on 10/19/21 #10 with 2 refills. LOV was with Dr Ermalene Searing on 12/11/21 for nausea and headache. LOV with Dr Selena Batten was on 10/05/21-no future appointments with [place]

## 2022-01-20 ENCOUNTER — Ambulatory Visit: Payer: BC Managed Care – PPO | Admitting: Psychiatry

## 2022-01-20 NOTE — Progress Notes (Unsigned)
° °  CC:  headaches  Follow-up Visit  Last visit: 10/19/21  Brief HPI: 51 year old female with a history of fibromyalgia, depression, and kidney stones who follows in clinic for migraines.  At her last visit Topamax was stopped due to lack of efficacy and history of kidney stones. Propranolol was started for prevention. Imitrex was increased to 50 mg PRN.  Interval History: Since her last visit***Imitrex***. Propranolol was increased by her PCP in January to 40 mg BID.***  Headache days per month: *** Headache free days per month: *** Headache severity: ***  Current Headache Regimen: Preventative: propranolol 40 mg BID, elavil 100 mg QHS*** Abortive: Imitrex 50 mg PRN***  # of doses of abortive medications per month: ***  Prior Therapies                                  Amitriptyline 100 mg QHS Topamax 50 mg QHS Cymbalta 60 mg daily Gabapentin 800 mg TID Imitrex 25 mg PRN Zofran Flexeril  Physical Exam:   Vital Signs: There were no vitals taken for this visit. GENERAL:  well appearing, in no acute distress, alert  SKIN:  Color, texture, turgor normal. No rashes or lesions HEAD:  Normocephalic/atraumatic. RESP: normal respiratory effort MSK:  No gross joint deformities.   NEUROLOGICAL: Mental Status: Alert, oriented to person, place and time, Follows commands, and Speech fluent and appropriate. Cranial Nerves: PERRL, face symmetric, no dysarthria, hearing grossly intact Motor: moves all extremities equally Gait: normal-based.  IMPRESSION: ***  PLAN: ***   Follow-up: ***  I spent a total of *** minutes on the date of the service. Headache education was done. Discussed lifestyle modification including increased oral hydration, decreased caffeine, exercise and stress management. Discussed treatment options including preventive and acute medications, natural supplements, and infusion therapy. Discussed medication overuse headache and to limit use of acute  treatments to no more than 2 days/week or 10 days/month. Discussed medication side effects, adverse reactions and drug interactions. Written educational materials and patient instructions outlining all of the above were given.  Ocie Doyne, MD

## 2022-02-09 ENCOUNTER — Other Ambulatory Visit: Payer: Self-pay | Admitting: Psychiatry

## 2022-02-09 ENCOUNTER — Other Ambulatory Visit: Payer: Self-pay | Admitting: Family Medicine

## 2022-02-09 DIAGNOSIS — F4310 Post-traumatic stress disorder, unspecified: Secondary | ICD-10-CM

## 2022-02-09 DIAGNOSIS — M797 Fibromyalgia: Secondary | ICD-10-CM

## 2022-02-11 ENCOUNTER — Encounter: Payer: Self-pay | Admitting: Psychiatry

## 2022-02-11 ENCOUNTER — Other Ambulatory Visit: Payer: Self-pay | Admitting: Psychiatry

## 2022-02-11 MED ORDER — PROPRANOLOL HCL 20 MG PO TABS: 20.0000 mg | ORAL_TABLET | Freq: Two times a day (BID) | ORAL | 11 refills | Status: DC

## 2022-02-11 NOTE — Telephone Encounter (Signed)
I sent in a refill to her pharmacy, thanks

## 2022-03-18 ENCOUNTER — Other Ambulatory Visit: Payer: Self-pay | Admitting: Psychiatry

## 2022-03-18 DIAGNOSIS — R519 Headache, unspecified: Secondary | ICD-10-CM | POA: Diagnosis not present

## 2022-03-18 DIAGNOSIS — H04123 Dry eye syndrome of bilateral lacrimal glands: Secondary | ICD-10-CM | POA: Diagnosis not present

## 2022-04-22 ENCOUNTER — Other Ambulatory Visit: Payer: Self-pay | Admitting: Gastroenterology

## 2022-04-22 ENCOUNTER — Other Ambulatory Visit: Payer: Self-pay | Admitting: Psychiatry

## 2022-04-22 ENCOUNTER — Other Ambulatory Visit: Payer: Self-pay | Admitting: Family Medicine

## 2022-04-22 DIAGNOSIS — M797 Fibromyalgia: Secondary | ICD-10-CM

## 2022-04-22 DIAGNOSIS — F4310 Post-traumatic stress disorder, unspecified: Secondary | ICD-10-CM

## 2022-04-22 DIAGNOSIS — G4452 New daily persistent headache (NDPH): Secondary | ICD-10-CM

## 2022-05-03 ENCOUNTER — Encounter: Payer: BC Managed Care – PPO | Admitting: Family Medicine

## 2022-05-11 ENCOUNTER — Other Ambulatory Visit (HOSPITAL_COMMUNITY)
Admission: RE | Admit: 2022-05-11 | Discharge: 2022-05-11 | Disposition: A | Discharge status: Home / Self Care | Payer: BC Managed Care – PPO | Source: Ambulatory Visit | Attending: Family Medicine | Admitting: Family Medicine

## 2022-05-11 ENCOUNTER — Encounter: Payer: Self-pay | Admitting: Family Medicine

## 2022-05-11 ENCOUNTER — Ambulatory Visit (INDEPENDENT_AMBULATORY_CARE_PROVIDER_SITE_OTHER): Payer: BC Managed Care – PPO | Admitting: Family Medicine

## 2022-05-11 VITALS — BP 120/80 | Ht 65.0 in | Wt 143.0 lb

## 2022-05-11 DIAGNOSIS — Z124 Encounter for screening for malignant neoplasm of cervix: Secondary | ICD-10-CM | POA: Diagnosis not present

## 2022-05-11 DIAGNOSIS — R102 Pelvic and perineal pain: Secondary | ICD-10-CM | POA: Diagnosis not present

## 2022-05-11 DIAGNOSIS — N946 Dysmenorrhea, unspecified: Secondary | ICD-10-CM | POA: Insufficient documentation

## 2022-05-11 DIAGNOSIS — R87610 Atypical squamous cells of undetermined significance on cytologic smear of cervix (ASC-US): Secondary | ICD-10-CM | POA: Diagnosis not present

## 2022-05-11 DIAGNOSIS — R1021 Pelvic and perineal pain right side: Secondary | ICD-10-CM

## 2022-05-11 NOTE — Progress Notes (Signed)
   GYNECOLOGY PROBLEM  VISIT ENCOUNTER NOTE  Subjective:   Jordan Zimmerman is a 51 y.o. G14P2000 female here for a problem GYN visit.    Referred by [facility] for cervical cancer screening (Dr. Gweneth Dimitri)   Current complaints:  Chief Complaint  Patient presents with   Dysmenorrhea    Painful periods, present for "years" since delivery in 2004. Reports progressively worsening. Now had pain 1 week prior to period and during. Associated sx include nausea.  Has cycle every 28 days that last 7 days with heavy bleeding throughout and clots History of ovarian cysts and fibromyalgia.  Denies abnormal vaginal bleeding, discharge, pelvic pain, problems with intercourse or other gynecologic concerns.    Gynecologic History Patient's last menstrual period was 05/03/2022.  Contraception: vasectomy  Health Maintenance Due  Topic Date Due   COVID-19 Vaccine (1) Never done   PAP SMEAR-Modifier  11/16/2021    The following portions of the patient's history were reviewed and updated as appropriate: allergies, current medications, past family history, past medical history, past social history, past surgical history and problem list.  Review of Systems Pertinent items are noted in HPI.   Objective:  BP 120/80   Ht 5\' 5"  (1.651 m)   Wt 143 lb (64.9 kg)   LMP 05/03/2022   BMI 23.80 kg/m  Gen: well appearing, NAD HEENT: no scleral icterus CV: RR Lung: Normal WOB Ext: warm well perfused  PELVIC: Normal appearing external genitalia; normal appearing vaginal mucosa and cervix.  No abnormal discharge noted.  There appears to be a prolapsed endometrial vs cervical polyp on exam. Tissue is friable.  Pap smear obtained.  Normal uterine size, no other palpable masses, no uterine tenderness. Right adnexal fullness and TTP.    Assessment and Plan:  1. Dysmenorrhea Suspect endometriosis givne description. Discussed plan for 07/03/2022 to r/o pathology. If not present would recommend hormonal control with OCP or  IUD to help with sx. If not working consider [place] treatment - Cytology - PAP - Pollie Friar PELVIC COMPLETE WITH TRANSVAGINAL; Future  2. Right adnexal tenderness - US PELVIC COMPLETE WITH TRANSVAGINAL; Future  3. Cervical cancer screening - pap collected - Results return in 3-5d  Face to face time:  20 minutes  Greater than 50% of the visit time was spent in counseling and coordination of care with the patient.  The summary and outline of the counseling and care coordination is summarized in the note above.   All questions were answered.  Please refer to After Visit Summary for other counseling recommendations.   No follow-ups on file.  [place], MD, MPH, ABFM Attending Physician Faculty Practice- Center for [facility]

## 2022-05-17 ENCOUNTER — Telehealth: Payer: Self-pay

## 2022-05-17 LAB — CYTOLOGY - PAP
Comment: NEGATIVE
Diagnosis: UNDETERMINED — AB
High risk HPV: NEGATIVE

## 2022-05-17 NOTE — Telephone Encounter (Signed)
-----   Message from Federico Flake, MD sent at 05/17/2022  9:29 AM EDT ----- ASCUS but prior was NIL. HPV also negative. Next pap in 3 years

## 2022-05-17 NOTE — Telephone Encounter (Signed)
Called pt. Pt aware of pap results

## 2022-05-24 ENCOUNTER — Other Ambulatory Visit: Payer: Self-pay | Admitting: Psychiatry

## 2022-05-27 ENCOUNTER — Ambulatory Visit (INDEPENDENT_AMBULATORY_CARE_PROVIDER_SITE_OTHER): Payer: BC Managed Care – PPO

## 2022-05-27 ENCOUNTER — Other Ambulatory Visit: Payer: Self-pay | Admitting: Family Medicine

## 2022-05-27 DIAGNOSIS — R102 Pelvic and perineal pain: Secondary | ICD-10-CM

## 2022-05-27 DIAGNOSIS — N946 Dysmenorrhea, unspecified: Secondary | ICD-10-CM

## 2022-06-08 ENCOUNTER — Ambulatory Visit (INDEPENDENT_AMBULATORY_CARE_PROVIDER_SITE_OTHER): Payer: BC Managed Care – PPO | Admitting: Family Medicine

## 2022-06-08 ENCOUNTER — Encounter: Payer: Self-pay | Admitting: Family Medicine

## 2022-06-08 VITALS — BP 122/72 | Resp 16 | Wt 144.4 lb

## 2022-06-08 DIAGNOSIS — N946 Dysmenorrhea, unspecified: Secondary | ICD-10-CM | POA: Diagnosis not present

## 2022-06-08 MED ORDER — NORGESTREL-ETHINYL ESTRADIOL 0.3-30 MG-MCG PO TABS: 1.0000 | ORAL_TABLET | Freq: Every day | ORAL | 4 refills | Status: DC

## 2022-06-08 NOTE — Progress Notes (Signed)
   GYNECOLOGY PROBLEM  VISIT ENCOUNTER NOTE  Subjective:   Jordan Zimmerman is a 51 y.o. G89P2000 female here for a problem GYN visit.     Current complaints:  Chief Complaint  Patient presents with   Results    Patient presents in office today to review over Ultrasound report from 05/27/22.     Previous visit: Painful periods, present for "years" since delivery in 2004. Reports progressively worsening. Now had pain 1 week prior to period and during. Associated sx include nausea.  Has cycle every 28 days that last 7 days with heavy bleeding throughout and clots  Since last visit:  - Has cycle that was typical with cramping, bleeding  - [place] on 6/29 WNL  Denies abnormal vaginal bleeding, discharge, pelvic pain, problems with intercourse or other gynecologic concerns.    Gynecologic History Patient's last menstrual period was 05/03/2022.  Contraception: vasectomy  Health Maintenance Due  Topic Date Due   COVID-19 Vaccine (1) Never done    The following portions of the patient's history were reviewed and updated as appropriate: allergies, current medications, past family history, past medical history, past social history, past surgical history and problem list.  Review of Systems Pertinent items are noted in HPI.   Objective:  BP 122/72   Resp 16   Wt 144 lb 6.4 oz (65.5 kg)   LMP 05/03/2022   BMI 24.03 kg/m  Gen: well appearing, NAD HEENT: no scleral icterus CV: RR Lung: Normal WOB Ext: warm well perfused   Assessment and Plan:  1. Dysmenorrhea Suspect endometriosis given description. Discussed plan for [place] to r/o pathology. If not present would recommend hormonal control with OCP or IUD to help with sx. If not working consider Pollie Friar treatment Patient interested in continuous OCP-- Rx sent today  Face to face time:  20 minutes  Greater than 50% of the visit time was spent in counseling and coordination of care with the patient.  The summary and outline of the counseling  and care coordination is summarized in the note above.   All questions were answered.  Please refer to After Visit Summary for other counseling recommendations.   No follow-ups on file.  Federico Flake, MD, MPH, ABFM Attending Physician Faculty Practice- Center for [facility]

## 2022-06-21 ENCOUNTER — Other Ambulatory Visit: Payer: Self-pay | Admitting: Psychiatry

## 2022-07-11 ENCOUNTER — Encounter: Payer: Self-pay | Admitting: Family Medicine

## 2022-07-11 ENCOUNTER — Other Ambulatory Visit: Payer: Self-pay | Admitting: Family Medicine

## 2022-07-11 DIAGNOSIS — M797 Fibromyalgia: Secondary | ICD-10-CM

## 2022-07-11 DIAGNOSIS — F4310 Post-traumatic stress disorder, unspecified: Secondary | ICD-10-CM

## 2022-07-14 NOTE — Telephone Encounter (Signed)
Called patient, mailbox was full

## 2022-07-14 NOTE — Telephone Encounter (Signed)
My chart message sent to pt.

## 2022-08-03 ENCOUNTER — Other Ambulatory Visit: Payer: Self-pay | Admitting: Family Medicine

## 2022-08-03 ENCOUNTER — Other Ambulatory Visit: Payer: Self-pay | Admitting: Psychiatry

## 2022-08-03 DIAGNOSIS — G4452 New daily persistent headache (NDPH): Secondary | ICD-10-CM

## 2022-08-03 NOTE — Telephone Encounter (Signed)
Last filled 12/11/21 #20 Last ov 12/11/21

## 2022-08-04 MED ORDER — ONDANSETRON 4 MG PO TBDP: 4.0000 mg | ORAL_TABLET | Freq: Three times a day (TID) | ORAL | 0 refills | Status: DC | PRN

## 2022-08-04 NOTE — Telephone Encounter (Signed)
Refill provided.   Please advise that patient should schedule a follow-up with Neurology or me to discuss alternative options for migraines.

## 2022-08-10 ENCOUNTER — Encounter (INDEPENDENT_AMBULATORY_CARE_PROVIDER_SITE_OTHER): Payer: BC Managed Care – PPO | Admitting: Psychiatry

## 2022-08-10 DIAGNOSIS — G43119 Migraine with aura, intractable, without status migrainosus: Secondary | ICD-10-CM | POA: Diagnosis not present

## 2022-08-10 MED ORDER — SUMATRIPTAN SUCCINATE 100 MG PO TABS: 100.0000 mg | ORAL_TABLET | ORAL | 6 refills | Status: DC | PRN

## 2022-08-10 MED ORDER — PROPRANOLOL HCL ER 60 MG PO CP24: 60.0000 mg | ORAL_CAPSULE | Freq: Every day | ORAL | 6 refills | Status: DC

## 2022-08-10 NOTE — Telephone Encounter (Signed)

## 2022-08-16 ENCOUNTER — Other Ambulatory Visit: Payer: Self-pay | Admitting: Family Medicine

## 2022-08-16 DIAGNOSIS — G4452 New daily persistent headache (NDPH): Secondary | ICD-10-CM

## 2022-08-22 ENCOUNTER — Other Ambulatory Visit: Payer: Self-pay | Admitting: Family Medicine

## 2022-08-22 DIAGNOSIS — G4452 New daily persistent headache (NDPH): Secondary | ICD-10-CM

## 2022-08-30 ENCOUNTER — Other Ambulatory Visit: Payer: Self-pay | Admitting: Family Medicine

## 2022-08-30 DIAGNOSIS — G4452 New daily persistent headache (NDPH): Secondary | ICD-10-CM

## 2022-09-07 ENCOUNTER — Other Ambulatory Visit: Payer: Self-pay | Admitting: Family Medicine

## 2022-09-22 ENCOUNTER — Other Ambulatory Visit: Payer: Self-pay

## 2022-09-22 DIAGNOSIS — G4452 New daily persistent headache (NDPH): Secondary | ICD-10-CM

## 2022-09-22 MED ORDER — ONDANSETRON 4 MG PO TBDP: 4.0000 mg | ORAL_TABLET | Freq: Three times a day (TID) | ORAL | 0 refills | Status: DC | PRN

## 2022-10-09 ENCOUNTER — Other Ambulatory Visit: Payer: Self-pay | Admitting: Family

## 2022-10-09 DIAGNOSIS — G4452 New daily persistent headache (NDPH): Secondary | ICD-10-CM

## 2022-10-20 ENCOUNTER — Encounter: Payer: Self-pay | Admitting: Family Medicine

## 2022-10-20 DIAGNOSIS — G4452 New daily persistent headache (NDPH): Secondary | ICD-10-CM

## 2022-10-20 MED ORDER — ONDANSETRON 4 MG PO TBDP: 4.0000 mg | ORAL_TABLET | Freq: Three times a day (TID) | ORAL | 2 refills | Status: DC | PRN

## 2022-10-20 NOTE — Telephone Encounter (Signed)
Per chart refill request was sent to [facility] on 10/13/22 but has not been done yet. Please review for patient.

## 2022-10-22 ENCOUNTER — Other Ambulatory Visit: Payer: Self-pay | Admitting: Psychiatry

## 2022-11-01 ENCOUNTER — Telehealth: Payer: Self-pay

## 2022-11-01 DIAGNOSIS — M797 Fibromyalgia: Secondary | ICD-10-CM

## 2022-11-01 DIAGNOSIS — F4310 Post-traumatic stress disorder, unspecified: Secondary | ICD-10-CM

## 2022-11-01 NOTE — Telephone Encounter (Signed)
LVM for patient tcb and schedule 

## 2022-11-01 NOTE — Telephone Encounter (Signed)
Received refill on Duloxetine. Please call patient once toc has been set up we can send for refill.

## 2022-11-09 NOTE — Telephone Encounter (Signed)
Patient scheduled for [facility] for March 1st

## 2022-11-10 ENCOUNTER — Other Ambulatory Visit: Payer: Self-pay | Admitting: Family

## 2022-11-10 DIAGNOSIS — F4310 Post-traumatic stress disorder, unspecified: Secondary | ICD-10-CM

## 2022-11-10 DIAGNOSIS — M797 Fibromyalgia: Secondary | ICD-10-CM

## 2022-11-10 MED ORDER — DULOXETINE HCL 60 MG PO CPEP: ORAL_CAPSULE | ORAL | 0 refills | Status: DC

## 2022-11-10 NOTE — Addendum Note (Signed)
Addended by: Benedict Needy on: 11/10/2022 03:45 PM   Modules accepted: Orders

## 2022-11-10 NOTE — Telephone Encounter (Signed)
Pt has TOC scheduled with Tabitha 01/28/23. Needs refill for Duloxetine.

## 2022-11-17 ENCOUNTER — Telehealth: Payer: Self-pay | Admitting: Psychiatry

## 2022-11-17 NOTE — Telephone Encounter (Signed)
Called pt. Left a vm to call the office to schedule an appointment.

## 2022-11-17 NOTE — Telephone Encounter (Signed)
Patient hasn't been seen since November of 2022, please reach out and see if she can be seen with a NP.

## 2022-12-23 ENCOUNTER — Encounter: Payer: Self-pay | Admitting: Family

## 2022-12-24 NOTE — Telephone Encounter (Signed)
Left message to return call to our office.  

## 2022-12-27 NOTE — Telephone Encounter (Signed)
Called and spoke with pharmacy she states patient just picked up a refill on 12/12/22 and she has 5 refills left.

## 2022-12-28 NOTE — Telephone Encounter (Signed)
Needs appt to be re-evaluated. Seems headaches are worsening

## 2023-01-02 ENCOUNTER — Encounter: Payer: Self-pay | Admitting: Family Medicine

## 2023-01-03 ENCOUNTER — Ambulatory Visit (INDEPENDENT_AMBULATORY_CARE_PROVIDER_SITE_OTHER): Payer: BC Managed Care – PPO | Admitting: Family

## 2023-01-03 ENCOUNTER — Other Ambulatory Visit: Payer: Self-pay | Admitting: Family

## 2023-01-03 ENCOUNTER — Encounter: Payer: Self-pay | Admitting: Family

## 2023-01-03 VITALS — BP 110/76 | HR 78 | Temp 98.4°F | Ht 65.0 in | Wt 142.2 lb

## 2023-01-03 DIAGNOSIS — Z6823 Body mass index (BMI) 23.0-23.9, adult: Secondary | ICD-10-CM

## 2023-01-03 DIAGNOSIS — R1013 Epigastric pain: Secondary | ICD-10-CM | POA: Diagnosis not present

## 2023-01-03 DIAGNOSIS — F514 Sleep terrors [night terrors]: Secondary | ICD-10-CM

## 2023-01-03 DIAGNOSIS — Z13228 Encounter for screening for other metabolic disorders: Secondary | ICD-10-CM | POA: Insufficient documentation

## 2023-01-03 DIAGNOSIS — F4310 Post-traumatic stress disorder, unspecified: Secondary | ICD-10-CM | POA: Diagnosis not present

## 2023-01-03 DIAGNOSIS — N179 Acute kidney failure, unspecified: Secondary | ICD-10-CM | POA: Diagnosis not present

## 2023-01-03 DIAGNOSIS — G4452 New daily persistent headache (NDPH): Secondary | ICD-10-CM

## 2023-01-03 DIAGNOSIS — R5383 Other fatigue: Secondary | ICD-10-CM | POA: Diagnosis not present

## 2023-01-03 DIAGNOSIS — G43809 Other migraine, not intractable, without status migrainosus: Secondary | ICD-10-CM

## 2023-01-03 LAB — CBC
HCT: 38.8 % (ref 36.0–46.0)
Hemoglobin: 12.7 g/dL (ref 12.0–15.0)
MCHC: 32.8 g/dL (ref 30.0–36.0)
MCV: 83.3 fl (ref 78.0–100.0)
Platelets: 420 10*3/uL — ABNORMAL HIGH (ref 150.0–400.0)
RBC: 4.65 Mil/uL (ref 3.87–5.11)
RDW: 17.3 % — ABNORMAL HIGH (ref 11.5–15.5)
WBC: 9.8 10*3/uL (ref 4.0–10.5)

## 2023-01-03 LAB — TSH: TSH: 1.58 u[IU]/mL (ref 0.35–5.50)

## 2023-01-03 LAB — BASIC METABOLIC PANEL
BUN: 16 mg/dL (ref 6–23)
CO2: 26 mEq/L (ref 19–32)
Calcium: 9.8 mg/dL (ref 8.4–10.5)
Chloride: 104 mEq/L (ref 96–112)
Creatinine, Ser: 1.34 mg/dL — ABNORMAL HIGH (ref 0.40–1.20)
GFR: 46.69 mL/min — ABNORMAL LOW (ref >60.00)
Glucose, Bld: 105 mg/dL — ABNORMAL HIGH (ref 70–99)
Potassium: 4.5 mEq/L (ref 3.5–5.1)
Sodium: 139 mEq/L (ref 135–145)

## 2023-01-03 LAB — SEDIMENTATION RATE: Sed Rate: 27 mm/hr — ABNORMAL HIGH (ref 0–20)

## 2023-01-03 MED ORDER — OMEPRAZOLE 40 MG PO CPDR: 40.0000 mg | DELAYED_RELEASE_CAPSULE | Freq: Every day | ORAL | 3 refills | Status: DC

## 2023-01-03 MED ORDER — ONDANSETRON 4 MG PO TBDP: 4.0000 mg | ORAL_TABLET | Freq: Three times a day (TID) | ORAL | 0 refills | Status: DC | PRN

## 2023-01-03 MED ORDER — QULIPTA 10 MG PO TABS: 10.0000 | ORAL_TABLET | Freq: Every day | ORAL | 0 refills | Status: DC

## 2023-01-03 NOTE — Patient Instructions (Signed)
  A referral was placed today for both psychiatry and psychology.  Please let us know if you have not heard back within 2 weeks about the referral.  Trial omeprazole 40 mg once daily for 30 days.  Even if there was an ulcer, this could help treat this.   CALL NEUROLOGIST TO TRY TO GET APPOINTMENT WITH THIS AS SOON AS ABLE.   Sent in [place] to try to get approved with insurance. Get this started if covered. If not will need to f/u with neurology.  ------------------------------------  Welcome to our clinic, I am happy to have you as my new patient. I am excited to continue on this healthcare journey with you.  ------------------------------------  ------------------------------------  Stop by the lab prior to leaving today. I will notify you of your results once received.   Please keep in mind Any my chart messages you send have up to a three business day turnaround for a response.  Phone calls may take up to a one full business day turnaround for a  response.   If you need a medication refill I recommend you request it through the pharmacy as this is easiest for [place] rather than sending a message and or phone call.   Due to recent changes in healthcare laws, you may see results of your imaging and/or laboratory studies on MyChart before I have had a chance to review them.  I understand that in some cases there may be results that are confusing or concerning to you. Please understand that not all results are received at the same time and often I may need to interpret multiple results in order to provide you with the best plan of care or course of treatment. Therefore, I ask that you please give me 2 business days to thoroughly review all your results before contacting my office for clarification. Should we see a critical lab result, you will be contacted sooner.   It was a pleasure seeing you today! Please do not hesitate to reach out with any questions and or concerns.  Regards,    Eugenia Pancoast FNP-C

## 2023-01-03 NOTE — Assessment & Plan Note (Signed)
Trial omeprazole 40 mg once daily  Try to decrease and or avoid spicy foods, fried fatty foods, and also caffeine and chocolate as these can increase heartburn symptoms.

## 2023-01-03 NOTE — Assessment & Plan Note (Signed)
Referral placed for psychiatry and psychology

## 2023-01-03 NOTE — Progress Notes (Signed)
Established Patient Office Visit  Subjective:   Patient ID: Jordan Zimmerman, female    DOB: 04/26/1971  Age: 52 y.o. MRN: [id number]  CC:  Chief Complaint  Patient presents with   Headache    HPI: Jordan Zimmerman is a 52 y.o. female presenting on 01/03/2023 for Headache   HPI  Migraines increasing in frequency, having several days in a row about 2-4 days. Worse with weather changes, stress, and along with her period. Takes sumatriptan, only gets 9 pills a month, but uses two at a time commonly. Has always had bad headaches even as a teenager. The older she gets the worse they seem to get. Did see a neurologist in the past but not since 2022. Did have a note in epic 08/10/22 from neurology to increase propanolol and give a higher dose of imitrex, but pt states this did not help at all.   Also with nausea that is almost constant.  Has taken imitrex and topamax in the past without good headache prevention.    Last neurology visit 10/19/21, increased imitrex to 50 mg prn  Endometriosis: diagnosed with this about 9 months ago.  Started about 9 months ago as well on OCP and improved slightly and then back to increased frequency.   Over the weekend touched her epigastric area and said she felt something 'like a baseball' which is not palpable today. Does have mild tenderness epigastric area today.   PTSD: her recent psychiatrist and also psychologist no longer under her insurance.    ROS: Negative unless specifically indicated above in HPI.   Relevant past medical history reviewed and updated as indicated.   Allergies and medications reviewed and updated.   Current Outpatient Medications:    acetaminophen (TYLENOL) 500 MG tablet, Take 1,000 mg by mouth every 6 (six) hours as needed., Disp: , Rfl:    Atogepant (QULIPTA) 10 MG TABS, Take 10 tablets by mouth daily., Disp: 30 tablet, Rfl: 0   norgestrel-ethinyl estradiol (LO/OVRAL) 0.3-30 MG-MCG tablet, Take 1 tablet by mouth daily., Disp: 84  tablet, Rfl: 4   omeprazole (PRILOSEC) 40 MG capsule, Take 1 capsule (40 mg total) by mouth daily., Disp: 30 capsule, Rfl: 3   propranolol ER (INDERAL LA) 60 MG 24 hr capsule, Take 1 capsule (60 mg total) by mouth daily., Disp: 30 capsule, Rfl: 6   SUMAtriptan (IMITREX) 100 MG tablet, Take 1 tablet (100 mg total) by mouth every 2 (two) hours as needed for migraine. May repeat in 2 hours if headache persists or recurs. Max dose 2 pills in 24 hours, Disp: 10 tablet, Rfl: 6   ondansetron (ZOFRAN ODT) 4 MG disintegrating tablet, Take 1 tablet (4 mg total) by mouth every 8 (eight) hours as needed for nausea or vomiting., Disp: 20 tablet, Rfl: 0  Allergies  Allergen Reactions   Ambien [Zolpidem Tartrate] Anaphylaxis   Amoxicillin Hives and Swelling    Fever.    Penicillins Hives and Other (See Comments)    High fever   Zolpidem Other (See Comments)    Objective:   BP 110/76   Pulse 78   Temp 98.4 F (36.9 C) (Temporal)   Ht 5\' 5"  (1.651 m)   Wt 142 lb 3.2 oz (64.5 kg)   SpO2 99%   BMI 23.66 kg/m    Physical Exam Constitutional:      General: She is not in acute distress.    Appearance: Normal appearance. She is normal weight. She is not ill-appearing, toxic-appearing or diaphoretic.  HENT:     Head: Normocephalic.  Cardiovascular:     Rate and Rhythm: Normal rate.  Pulmonary:     Effort: Pulmonary effort is normal.  Abdominal:     General: Bowel sounds are normal.     Tenderness: There is abdominal tenderness in the epigastric area. There is no guarding or rebound.     Hernia: No hernia is present.  Musculoskeletal:        General: Normal range of motion.  Neurological:     General: No focal deficit present.     Mental Status: She is alert and oriented to person, place, and time. Mental status is at baseline.     Cranial Nerves: Cranial nerves 2-12 are intact. No cranial nerve deficit or facial asymmetry.     Sensory: Sensation is intact. No sensory deficit.     Motor:  Motor function is intact. No weakness, atrophy or seizure activity.     Coordination: Coordination is intact.     Gait: Gait is intact.  Psychiatric:        Mood and Affect: Mood normal.        Behavior: Behavior normal.        Thought Content: Thought content normal.        Judgment: Judgment normal.     Assessment & Plan:  Other fatigue -     TSH -     CBC  New daily persistent headache -     Ondansetron; Take 1 tablet (4 mg total) by mouth every 8 (eight) hours as needed for nausea or vomiting.  Dispense: 20 tablet; Refill: 0 -     Qulipta; Take 10 tablets by mouth daily.  Dispense: 30 tablet; Refill: 0 -     TSH -     Sedimentation rate -     CBC -     Basic metabolic panel  Epigastric pain Assessment & Plan: Trial omeprazole 40 mg once daily  Try to decrease and or avoid spicy foods, fried fatty foods, and also caffeine and chocolate as these can increase heartburn symptoms.    Orders: -     Omeprazole; Take 1 capsule (40 mg total) by mouth daily.  Dispense: 30 capsule; Refill: 3  PTSD (post-traumatic stress disorder) Assessment & Plan: Referral placed for psychiatry and psychology   Orders: -     Ambulatory referral to Psychology -     Ambulatory referral to Psychiatry  Night terrors, adult -     Ambulatory referral to Psychology -     Ambulatory referral to Psychiatry  Other migraine without status migrainosus, not intractable Assessment & Plan: Worsening Advised pt to see neurology to get eval/treat  Sending in qulipta to see if covered by insurance. Continue propanolol  Labs today to r/o other ddx  Make appt for yearly eye exam, as about due.    Body mass index (BMI) of 23.0 to 23.9 in adult     Follow up plan: Return for as scheduled for TOC in march .  Eugenia Pancoast, FNP

## 2023-01-03 NOTE — Assessment & Plan Note (Signed)
Worsening Advised pt to see neurology to get eval/treat  Sending in qulipta to see if covered by insurance. Continue propanolol  Labs today to r/o other ddx  Make appt for yearly eye exam, as about due.

## 2023-01-04 ENCOUNTER — Other Ambulatory Visit: Payer: BC Managed Care – PPO

## 2023-01-04 ENCOUNTER — Other Ambulatory Visit: Payer: Self-pay | Admitting: Family

## 2023-01-04 ENCOUNTER — Encounter: Payer: Self-pay | Admitting: Family

## 2023-01-04 DIAGNOSIS — R8271 Bacteriuria: Secondary | ICD-10-CM | POA: Diagnosis not present

## 2023-01-04 DIAGNOSIS — N179 Acute kidney failure, unspecified: Secondary | ICD-10-CM

## 2023-01-04 LAB — URINALYSIS, ROUTINE W REFLEX MICROSCOPIC
Ketones, ur: 15 — AB
Nitrite: NEGATIVE
Specific Gravity, Urine: 1.02 (ref 1.000–1.030)
Total Protein, Urine: NEGATIVE
Urine Glucose: NEGATIVE
Urobilinogen, UA: 0.2 (ref 0.0–1.0)
pH: 6 (ref 5.0–8.0)

## 2023-01-04 NOTE — Telephone Encounter (Signed)
Called reviewed labs with patient see lab results for documentation.

## 2023-01-04 NOTE — Progress Notes (Signed)
Sed rate elevation expected with headaches at current.  Platelets are typically an inflammatory reaction as well.   Kidney function has declined from one year ago, have pt drop off urine. Placing orders for urine culture and urinalysis. Have pt come in for lab only x two weeks to also repeat kidney function. Encourage pt to increase water intake as dehydration can tax kidneys as well. Need to go easy on ibuprofen/imitrex as well as this may also be hurting kidneys.

## 2023-01-05 ENCOUNTER — Encounter: Payer: Self-pay | Admitting: Family

## 2023-01-05 ENCOUNTER — Other Ambulatory Visit: Payer: Self-pay | Admitting: Family

## 2023-01-05 DIAGNOSIS — N3001 Acute cystitis with hematuria: Secondary | ICD-10-CM

## 2023-01-05 LAB — URINE CULTURE
MICRO NUMBER:: 14525844
Result:: NO GROWTH
SPECIMEN QUALITY:: ADEQUATE

## 2023-01-05 MED ORDER — SULFAMETHOXAZOLE-TRIMETHOPRIM 800-160 MG PO TABS: 1.0000 | ORAL_TABLET | Freq: Two times a day (BID) | ORAL | 0 refills | Status: AC

## 2023-01-13 ENCOUNTER — Other Ambulatory Visit: Payer: Self-pay | Admitting: Family

## 2023-01-13 DIAGNOSIS — G4452 New daily persistent headache (NDPH): Secondary | ICD-10-CM

## 2023-01-17 ENCOUNTER — Ambulatory Visit (INDEPENDENT_AMBULATORY_CARE_PROVIDER_SITE_OTHER): Payer: BC Managed Care – PPO | Admitting: Obstetrics & Gynecology

## 2023-01-17 ENCOUNTER — Other Ambulatory Visit (HOSPITAL_COMMUNITY)
Admission: RE | Admit: 2023-01-17 | Discharge: 2023-01-17 | Disposition: A | Discharge status: Home / Self Care | Payer: BC Managed Care – PPO | Source: Ambulatory Visit | Attending: Obstetrics & Gynecology | Admitting: Obstetrics & Gynecology

## 2023-01-17 ENCOUNTER — Encounter: Payer: Self-pay | Admitting: Obstetrics & Gynecology

## 2023-01-17 VITALS — BP 122/79 | HR 67 | Ht 65.0 in | Wt 141.0 lb

## 2023-01-17 DIAGNOSIS — N946 Dysmenorrhea, unspecified: Secondary | ICD-10-CM | POA: Insufficient documentation

## 2023-01-17 DIAGNOSIS — N841 Polyp of cervix uteri: Secondary | ICD-10-CM | POA: Diagnosis not present

## 2023-01-17 DIAGNOSIS — Z1231 Encounter for screening mammogram for malignant neoplasm of breast: Secondary | ICD-10-CM | POA: Diagnosis not present

## 2023-01-17 DIAGNOSIS — Z30430 Encounter for insertion of intrauterine contraceptive device: Secondary | ICD-10-CM

## 2023-01-17 DIAGNOSIS — N809 Endometriosis, unspecified: Secondary | ICD-10-CM | POA: Insufficient documentation

## 2023-01-17 MED ORDER — LEVONORGESTREL 20 MCG/DAY IU IUD
1.0000 | INTRAUTERINE_SYSTEM | Freq: Once | INTRAUTERINE | Status: AC
  Administered 2023-01-17: 1 via INTRAUTERINE

## 2023-01-17 NOTE — Progress Notes (Signed)
    GYNECOLOGY PROGRESS NOTE  Subjective:    Patient ID: Jordan Zimmerman, female    DOB: Jun 02, 1971, 52 y.o.   MRN: [id number]  HPI  Patient is a 52 y.o. married [id number] female who presents for discussion of treatment of dysmenorrhea/presumed endometriosis.  She has migraine with aura so she is not a candidate for continuous OCPs. She has an ulcer and cannot take NSAIDS. She has "the beginnings of osteoporosis" and so I don't think depo lupron would be the best option.    Review of Systems  Pap smear ASCUS negative HR HPV 04/2022. Her husband had a vasectomy.   Objective:   Blood pressure 122/79, pulse 67, height 5' 5"$  (1.651 m), weight 141 lb (64 kg), last menstrual period 12/15/2022. Body mass index is 23.46 kg/m. General appearance: alert Abdomen: soft, non-tender; bowel sounds normal; no masses,  no organomegaly Pelvic: cervix normal in appearance, external genitalia normal, no adnexal masses or tenderness, no cervical motion tenderness, uterus normal size, shape, and consistency, and vagina normal without discharge Extremities: extremities normal, atraumatic, no cyanosis or edema Neurologic: Grossly normal   UPT negative, consent signed, Time out procedure done. Bimanual exam revealed a uterus NSSA, no adnexal masses or tenderness Pederson speculum placed. Cervical polyp noted Cervix prepped with betadine and Hurricaine spray and then the polyp was grasped with a ring forceps. I twisted until the polyp came free. Hemostasis was noted. I then placed a single tooth tenaculum onto the anterior lip of the cervix. Mirena was easily placed and the strings were cut to 3-4 cm. Uterus sounded to 8 cm. She tolerated the procedure well.   Assessment:   Dysmenorrhea/suspect endometriosis- trial of Mirena  Plan:   Come back 3 months/prn sooner Schedule mammogram

## 2023-01-19 ENCOUNTER — Other Ambulatory Visit (INDEPENDENT_AMBULATORY_CARE_PROVIDER_SITE_OTHER): Payer: BC Managed Care – PPO

## 2023-01-19 DIAGNOSIS — N179 Acute kidney failure, unspecified: Secondary | ICD-10-CM

## 2023-01-19 LAB — SURGICAL PATHOLOGY

## 2023-01-20 ENCOUNTER — Other Ambulatory Visit: Payer: Self-pay | Admitting: Family

## 2023-01-20 ENCOUNTER — Encounter: Payer: Self-pay | Admitting: Obstetrics & Gynecology

## 2023-01-20 DIAGNOSIS — R944 Abnormal results of kidney function studies: Secondary | ICD-10-CM

## 2023-01-20 DIAGNOSIS — N179 Acute kidney failure, unspecified: Secondary | ICD-10-CM

## 2023-01-20 LAB — BASIC METABOLIC PANEL
BUN: 16 mg/dL (ref 6–23)
CO2: 28 mEq/L (ref 19–32)
Calcium: 10.1 mg/dL (ref 8.4–10.5)
Chloride: 106 mEq/L (ref 96–112)
Creatinine, Ser: 1.28 mg/dL — ABNORMAL HIGH (ref 0.40–1.20)
GFR: 49.31 mL/min — ABNORMAL LOW (ref >60.00)
Glucose, Bld: 111 mg/dL — ABNORMAL HIGH (ref 70–99)
Potassium: 4.2 mEq/L (ref 3.5–5.1)
Sodium: 141 mEq/L (ref 135–145)

## 2023-01-21 ENCOUNTER — Encounter: Payer: Self-pay | Admitting: Family

## 2023-01-21 NOTE — Telephone Encounter (Signed)
Left message to return call to our office. Sent a Mychart message that I will send the Zofran refill request to [facility] and will contact her Monday once she returns to office. Visit an Urgent care if nausea persists over the weekend.

## 2023-01-21 NOTE — Telephone Encounter (Signed)
Spoke with patient and advised that I will check to see if the zofran can be sent. I did advise that Lawer[facility] Bach is out of the office, but will call her back.

## 2023-01-26 ENCOUNTER — Ambulatory Visit
Admission: RE | Admit: 2023-01-26 | Discharge: 2023-01-26 | Disposition: A | Discharge status: Home / Self Care | Payer: BC Managed Care – PPO | Source: Ambulatory Visit | Attending: Family | Admitting: Family

## 2023-01-26 DIAGNOSIS — R944 Abnormal results of kidney function studies: Secondary | ICD-10-CM | POA: Diagnosis not present

## 2023-01-26 DIAGNOSIS — N200 Calculus of kidney: Secondary | ICD-10-CM | POA: Diagnosis not present

## 2023-01-26 DIAGNOSIS — N179 Acute kidney failure, unspecified: Secondary | ICD-10-CM | POA: Diagnosis not present

## 2023-01-27 ENCOUNTER — Other Ambulatory Visit: Payer: Self-pay | Admitting: Family

## 2023-01-27 DIAGNOSIS — N1339 Other hydronephrosis: Secondary | ICD-10-CM

## 2023-01-27 DIAGNOSIS — R944 Abnormal results of kidney function studies: Secondary | ICD-10-CM

## 2023-01-27 DIAGNOSIS — N200 Calculus of kidney: Secondary | ICD-10-CM

## 2023-01-27 NOTE — Progress Notes (Signed)
Both kidneys show hydronephrosis which is basically build up or urine that is causing slight stretching of the kidneys which is likely the reason for your change your filtration rate seen on recent lab work testing the kidneys. I am going to refer you tonephrology for further evaluation.

## 2023-01-28 ENCOUNTER — Encounter: Payer: Self-pay | Admitting: *Deleted

## 2023-01-28 ENCOUNTER — Ambulatory Visit (INDEPENDENT_AMBULATORY_CARE_PROVIDER_SITE_OTHER): Payer: BC Managed Care – PPO | Admitting: Family

## 2023-01-28 ENCOUNTER — Encounter: Payer: Self-pay | Admitting: Family

## 2023-01-28 VITALS — BP 112/74 | HR 63 | Temp 97.8°F | Ht 65.0 in | Wt 140.6 lb

## 2023-01-28 DIAGNOSIS — G4452 New daily persistent headache (NDPH): Secondary | ICD-10-CM

## 2023-01-28 DIAGNOSIS — G43809 Other migraine, not intractable, without status migrainosus: Secondary | ICD-10-CM

## 2023-01-28 DIAGNOSIS — R7989 Other specified abnormal findings of blood chemistry: Secondary | ICD-10-CM | POA: Diagnosis not present

## 2023-01-28 DIAGNOSIS — R768 Other specified abnormal immunological findings in serum: Secondary | ICD-10-CM

## 2023-01-28 DIAGNOSIS — M2550 Pain in unspecified joint: Secondary | ICD-10-CM | POA: Diagnosis not present

## 2023-01-28 LAB — CBC WITH DIFFERENTIAL/PLATELET
Basophils Absolute: 0.1 10*3/uL (ref 0.0–0.1)
Basophils Relative: 0.6 % (ref 0.0–3.0)
Eosinophils Absolute: 0.2 10*3/uL (ref 0.0–0.7)
Eosinophils Relative: 2 % (ref 0.0–5.0)
HCT: 40.9 % (ref 36.0–46.0)
Hemoglobin: 13.2 g/dL (ref 12.0–15.0)
Lymphocytes Relative: 25.1 % (ref 12.0–46.0)
Lymphs Abs: 2.8 10*3/uL (ref 0.7–4.0)
MCHC: 32.3 g/dL (ref 30.0–36.0)
MCV: 85.7 fl (ref 78.0–100.0)
Monocytes Absolute: 0.4 10*3/uL (ref 0.1–1.0)
Monocytes Relative: 4 % (ref 3.0–12.0)
Neutro Abs: 7.6 10*3/uL (ref 1.4–7.7)
Neutrophils Relative %: 68.3 % (ref 43.0–77.0)
Platelets: 393 10*3/uL (ref 150.0–400.0)
RBC: 4.77 Mil/uL (ref 3.87–5.11)
RDW: 18.3 % — ABNORMAL HIGH (ref 11.5–15.5)
WBC: 11.1 10*3/uL — ABNORMAL HIGH (ref 4.0–10.5)

## 2023-01-28 LAB — SEDIMENTATION RATE: Sed Rate: 32 mm/hr — ABNORMAL HIGH (ref 0–20)

## 2023-01-28 MED ORDER — ONDANSETRON 4 MG PO TBDP: 4.0000 mg | ORAL_TABLET | Freq: Three times a day (TID) | ORAL | 0 refills | Status: DC | PRN

## 2023-01-28 NOTE — Progress Notes (Unsigned)
Established Patient Office Visit  Subjective:      CC:  Chief Complaint  Patient presents with   Establish Care    HPI: Jordan Zimmerman is a 52 y.o. female presenting on 01/28/2023 for Establish Care . Endometriosis, recently had IUD placed for the pain by her OBGYN Dr. Hulan Fray.  Mammogram was ordered by GYN at this visit.    PTSD and night terrors, recently established care with a new therapist.   Bil ankles with some swelling and hurting to stand on.  She thinks she might have arthritis.   Finds bil ankles and bil shoulders waking up with stiffness and pain, worsening, and harder to walk with this. She is taking extra strength tylenol but only takes the pain away for a few hours.   New complaints: ***     Social history:  Relevant past medical, surgical, family and social history reviewed and updated as indicated. Interim medical history since our last visit reviewed.  Allergies and medications reviewed and updated.  DATA REVIEWED: CHART IN EPIC     ROS: Negative unless specifically indicated above in HPI.    Current Outpatient Medications:    acetaminophen (TYLENOL) 500 MG tablet, Take 1,000 mg by mouth every 6 (six) hours as needed., Disp: , Rfl:    Atogepant (QULIPTA) 10 MG TABS, Take 1 tablet by mouth daily., Disp: 30 tablet, Rfl: 0   omeprazole (PRILOSEC) 40 MG capsule, Take 1 capsule (40 mg total) by mouth daily., Disp: 30 capsule, Rfl: 3   propranolol ER (INDERAL LA) 60 MG 24 hr capsule, Take 1 capsule (60 mg total) by mouth daily., Disp: 30 capsule, Rfl: 6   ondansetron (ZOFRAN ODT) 4 MG disintegrating tablet, Take 1 tablet (4 mg total) by mouth every 8 (eight) hours as needed for nausea or vomiting., Disp: 20 tablet, Rfl: 0      Objective:    BP 112/74   Pulse 63   Temp 97.8 F (36.6 C) (Temporal)   Ht '5\' 5"'$  (1.651 m)   Wt 140 lb 9.6 oz (63.8 kg)   LMP 01/24/2023   SpO2 99%   BMI 23.40 kg/m   Wt Readings from Last 3 Encounters:  01/28/23 140 lb  9.6 oz (63.8 kg)  01/17/23 141 lb (64 kg)  01/03/23 142 lb 3.2 oz (64.5 kg)    Physical Exam  Physical Exam Constitutional:      General: not in acute distress.    Appearance: Normal appearance. normal weight. is not ill-appearing, toxic-appearing or diaphoretic.  Cardiovascular:     Rate and Rhythm: Normal rate.  Pulmonary:     Effort: Pulmonary effort is normal.  Musculoskeletal:        General: Normal range of motion.  Neurological:     General: No focal deficit present.     Mental Status: alert and oriented to person, place, and time. Mental status is at baseline.  Psychiatric:        Mood and Affect: Mood normal.        Behavior: Behavior normal.        Thought Content: Thought content normal.        Judgment: Judgment normal.        Assessment & Plan:  Polyarthralgia -     Sedimentation rate -     Rheumatoid factor -     ANA  New daily persistent headache -     Ondansetron; Take 1 tablet (4 mg total) by mouth every 8 (eight) hours  as needed for nausea or vomiting.  Dispense: 20 tablet; Refill: 0 -     CBC with Differential/Platelet  Abnormal CBC -     CBC with Differential/Platelet     Return in about 3 months (around 04/30/2023) for f/u with kidney concerns .  Eugenia Pancoast, MSN, APRN, FNP-C [place]

## 2023-01-28 NOTE — Patient Instructions (Addendum)
  CALL NEUROLOGIST for follow up appointment you are way overdue.   A referral was placed today for urology.  Please let us know if you have not heard back within 2 weeks about the referral.  Try some over the counter voltaren gel.    Regards,   Eugenia Pancoast FNP-C

## 2023-01-30 LAB — ANTI-NUCLEAR AB-TITER (ANA TITER): ANA Titer 1: 1:80 {titer} — ABNORMAL HIGH

## 2023-01-30 LAB — RHEUMATOID FACTOR: Rheumatoid fact SerPl-aCnc: <14 IU/mL (ref <14)

## 2023-01-30 LAB — ANA: Anti Nuclear Antibody (ANA): POSITIVE — AB

## 2023-01-31 ENCOUNTER — Encounter: Payer: Self-pay | Admitting: Family

## 2023-01-31 ENCOUNTER — Other Ambulatory Visit: Payer: Self-pay | Admitting: Family

## 2023-01-31 DIAGNOSIS — R700 Elevated erythrocyte sedimentation rate: Secondary | ICD-10-CM

## 2023-01-31 DIAGNOSIS — M797 Fibromyalgia: Secondary | ICD-10-CM

## 2023-01-31 DIAGNOSIS — R7689 Other specified abnormal immunological findings in serum: Secondary | ICD-10-CM

## 2023-01-31 DIAGNOSIS — R768 Other specified abnormal immunological findings in serum: Secondary | ICD-10-CM

## 2023-01-31 DIAGNOSIS — M2550 Pain in unspecified joint: Secondary | ICD-10-CM

## 2023-01-31 NOTE — Telephone Encounter (Signed)
FYI to [place].

## 2023-02-02 ENCOUNTER — Encounter: Payer: Self-pay | Admitting: Family

## 2023-02-02 DIAGNOSIS — G4452 New daily persistent headache (NDPH): Secondary | ICD-10-CM

## 2023-02-02 NOTE — Telephone Encounter (Signed)
Please see mychart message and refill request. Atogepant (QULIPTA) 10 MG TABS  LV- 01/28/23; LR- 01/04/23 (30 tabs/ no refills); NV- not scheduled

## 2023-02-03 ENCOUNTER — Ambulatory Visit (INDEPENDENT_AMBULATORY_CARE_PROVIDER_SITE_OTHER): Payer: BC Managed Care – PPO | Admitting: Behavioral Health

## 2023-02-03 ENCOUNTER — Encounter: Payer: Self-pay | Admitting: Family

## 2023-02-03 DIAGNOSIS — M2550 Pain in unspecified joint: Secondary | ICD-10-CM | POA: Insufficient documentation

## 2023-02-03 DIAGNOSIS — G4452 New daily persistent headache (NDPH): Secondary | ICD-10-CM | POA: Insufficient documentation

## 2023-02-03 DIAGNOSIS — F418 Other specified anxiety disorders: Secondary | ICD-10-CM

## 2023-02-03 MED ORDER — QULIPTA 10 MG PO TABS: 1.0000 | ORAL_TABLET | Freq: Every day | ORAL | 0 refills | Status: DC

## 2023-02-03 NOTE — Assessment & Plan Note (Signed)
R/o autoimmune, RA, PSA or other MCTD with labs pending results. Consider referral to rheumatology  Consider meloxicam

## 2023-02-03 NOTE — Assessment & Plan Note (Deleted)
Improvign with qulipta continue current dose  Avoid triggers as able Continue to hydrate with water

## 2023-02-03 NOTE — Assessment & Plan Note (Signed)
Worsening,   Continue with qulipta however did advise cutting back on daily otc tylenol/ibuprofen as may be causing rebound headache.  Pt is overdue for neurology appt, advised pt to call and schedule f/u appt to reevaluate  Avoid triggers as able Continue to hydrate with water

## 2023-02-03 NOTE — Progress Notes (Signed)
                Hannia Matchett L Matisse Salais, LMFT 

## 2023-02-03 NOTE — Progress Notes (Signed)
[place] Counselor Initial Adult Exam  Name: Jordan Zimmerman Date: 02/03/2023 MRN: [id number] DOB: 21-Apr-1971 PCP: Jordan Pancoast, FNP  Time spent: 60 min Caregility video; Pt is home in private not feeling well & Provider working remote from [place]:  Self    Paperwork requested: No   Reason for Visit /Presenting Problem: anx/dep & health issues related to her Dx of Fibromyalgia  Mental Status Exam: Appearance:   Casual     Behavior:  Appropriate and Sharing  Motor:  Normal  Speech/Language:   Clear and Coherent  Affect:  Appropriate  Mood:  normal  Thought process:  normal  Thought content:    WNL  Sensory/Perceptual disturbances:    WNL  Orientation:  oriented to person, place, and time/date  Attention:  Good  Concentration:  Good  Memory:  WNL  Fund of knowledge:   Good  Insight:    Good  Judgment:   Good  Impulse Control:  Good    Risk Assessment: Danger to Self:  No Self-injurious Behavior: No Danger to Others: No Duty to Warn:no Physical Aggression / Violence:No  Access to Firearms a concern: No  Gang Involvement: NO Patient / guardian was educated about steps to take if suicide or homicide risk level increases between visits: yes While future psychiatric events cannot be accurately predicted, the patient does not currently require acute inpatient psychiatric care and does not currently meet [facility] involuntary commitment criteria.  Substance Abuse History: Current substance abuse: No     Past Psychiatric History:   No previous psychological problems have been observed Outpatient Providers:Jordan Dugal, FNP History of Psych Hospitalization: No  Psychological Testing:  NA    Abuse History:  Victim of: No.,  NA    Report needed: No. Victim of Neglect:No. Perpetrator of  NA   Witness / Exposure to Domestic Violence: No   Protective Services Involvement: No  Witness to [employer] Violence:  No   Family History:   Family History  Problem Relation Age of Onset   Diabetes Mother    Hypertension Father    Diabetes Father    Prostate cancer Father    Breast cancer Maternal Grandmother    Breast cancer Maternal Aunt 45    Living situation: the patient lives with their spouse  Sexual Orientation: Straight  Relationship Status: married  Name of spouse / other: Jordan Zimmerman If a parent, number of children / ages: 15yo Jordan Zimmerman who lives on the property of Mom & Dad w/GF Musician & 83yo Son Jordan Zimmerman who lives in [place] w/his Jordan Zimmerman: spouse Jordan Zimmerman Friends - Jordan Zimmerman who speaks via text as support Parents - Dad Jordan Zimmerman is supportive & Mother Jordan Zimmerman is challenged to support Jordan Zimmerman in her health struggles   Financial Stress:  No   Income/Employment/Disability: 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 [place]: No   Educational History: Education: high school diploma/GED; Comptroller from [employer]   Religion/Sprituality/World View: [employer] upbringing & now still fllws this faith system  Any cultural differences that may affect / interfere with treatment:  None noted today  Recreation/Hobbies: Unk  Stressors: Health problems   Loss of solid health situation    Strengths: Family, Friends, Conservator, museum/gallery, and Able to Communicate Effectively  Barriers:  None noted today   Legal History: Pending legal issue / charges: The patient has no significant history of legal issues. History of legal issue / charges:  NA  Medical History/Surgical History: reviewed  Past Medical History:  Diagnosis Date   Anxiety    Chronic fatigue    Fibromyalgia    History of kidney stones    IBS (irritable bowel syndrome)    Kidney stones    Migraines     Past Surgical History:  Procedure Laterality Date   CHOLECYSTECTOMY  2001   COLONOSCOPY N/A 07/21/2021   Procedure: COLONOSCOPY;  Surgeon: Jonathon Bellows, MD;  [facility];  Service: Gastroenterology;  Laterality: N/A;    CYSTOSCOPY W/ URETEROSCOPY  ~ 2008   "pulled stone rest of the way out" (05/21/2013)   EXTRACORPOREAL SHOCK WAVE LITHOTRIPSY Right 10/06/2017   Procedure: RIGHT EXTRACORPOREAL SHOCK WAVE LITHOTRIPSY (ESWL);  Surgeon: Irine Seal, MD;  Location: [facility] ORS;  Service: Urology;  Laterality: Right;   FOOT FRACTURE SURGERY Bilateral    TONSILLECTOMY  1992    Medications: Current Outpatient Medications  Medication Sig Dispense Refill   acetaminophen (TYLENOL) 500 MG tablet Take 1,000 mg by mouth every 6 (six) hours as needed.     Atogepant (QULIPTA) 10 MG TABS Take 1 tablet by mouth daily. 90 tablet 0   omeprazole (PRILOSEC) 40 MG capsule Take 1 capsule (40 mg total) by mouth daily. 30 capsule 3   ondansetron (ZOFRAN ODT) 4 MG disintegrating tablet Take 1 tablet (4 mg total) by mouth every 8 (eight) hours as needed for nausea or vomiting. 20 tablet 0   propranolol ER (INDERAL LA) 60 MG 24 hr capsule Take 1 capsule (60 mg total) by mouth daily. 30 capsule 6   No current facility-administered medications for this visit.    Allergies  Allergen Reactions   Ambien [Zolpidem Tartrate] Anaphylaxis   Amoxicillin Hives and Swelling    Fever.    Penicillins Hives and Other (See Comments)    High fever    Diagnoses:  Depression with anxiety  Plan of Care: Jordan Zimmerman expresses frustration w/her health issues & other individuals response to them. She will use a Notebook to record btwn sessions the thoughts/feelings & events she exp's btwn our visits that will promote focus & meaning to our psychotherapy visits.  Target Date: 03/06/2023  Progress: 3  Frequency: Twice monthly  Modality: Jordan Reaper, LMFT

## 2023-02-09 ENCOUNTER — Encounter: Payer: Self-pay | Admitting: Family Medicine

## 2023-02-21 ENCOUNTER — Other Ambulatory Visit: Payer: Self-pay | Admitting: Psychiatry

## 2023-02-21 NOTE — Telephone Encounter (Signed)
Last seen on 10/19/2021 Follow up scheduled on 09/07/23

## 2023-02-24 ENCOUNTER — Ambulatory Visit: Payer: BC Managed Care – PPO | Admitting: Behavioral Health

## 2023-02-24 NOTE — Progress Notes (Unsigned)
                Kiasha Bellin L Kaytlin Burklow, LMFT 

## 2023-03-01 ENCOUNTER — Encounter: Payer: Self-pay | Admitting: Family

## 2023-03-01 DIAGNOSIS — G4452 New daily persistent headache (NDPH): Secondary | ICD-10-CM

## 2023-03-01 DIAGNOSIS — G43809 Other migraine, not intractable, without status migrainosus: Secondary | ICD-10-CM

## 2023-03-02 DIAGNOSIS — R8290 Unspecified abnormal findings in urine: Secondary | ICD-10-CM | POA: Diagnosis not present

## 2023-03-02 DIAGNOSIS — N200 Calculus of kidney: Secondary | ICD-10-CM | POA: Diagnosis not present

## 2023-03-02 DIAGNOSIS — N179 Acute kidney failure, unspecified: Secondary | ICD-10-CM | POA: Diagnosis not present

## 2023-03-02 DIAGNOSIS — G43909 Migraine, unspecified, not intractable, without status migrainosus: Secondary | ICD-10-CM | POA: Diagnosis not present

## 2023-03-02 MED ORDER — QULIPTA 30 MG PO TABS: 30.0000 mg | ORAL_TABLET | Freq: Every day | ORAL | 2 refills | Status: DC

## 2023-03-02 MED ORDER — ONDANSETRON 4 MG PO TBDP: 4.0000 mg | ORAL_TABLET | Freq: Three times a day (TID) | ORAL | 0 refills | Status: DC | PRN

## 2023-03-06 ENCOUNTER — Other Ambulatory Visit: Payer: Self-pay | Admitting: Family

## 2023-03-06 DIAGNOSIS — R1013 Epigastric pain: Secondary | ICD-10-CM

## 2023-03-23 ENCOUNTER — Ambulatory Visit
Admission: RE | Admit: 2023-03-23 | Discharge: 2023-03-23 | Disposition: A | Discharge status: Home / Self Care | Payer: BC Managed Care – PPO | Source: Ambulatory Visit | Attending: Urology | Admitting: Urology

## 2023-03-23 ENCOUNTER — Other Ambulatory Visit: Payer: Self-pay | Admitting: Urology

## 2023-03-23 ENCOUNTER — Ambulatory Visit: Payer: BC Managed Care – PPO | Admitting: Urology

## 2023-03-23 ENCOUNTER — Encounter: Payer: Self-pay | Admitting: Urology

## 2023-03-23 VITALS — BP 130/81 | HR 61 | Ht 65.0 in | Wt 139.0 lb

## 2023-03-23 DIAGNOSIS — Z87442 Personal history of urinary calculi: Secondary | ICD-10-CM

## 2023-03-23 DIAGNOSIS — N1330 Unspecified hydronephrosis: Secondary | ICD-10-CM | POA: Insufficient documentation

## 2023-03-23 DIAGNOSIS — N201 Calculus of ureter: Secondary | ICD-10-CM | POA: Diagnosis not present

## 2023-03-23 DIAGNOSIS — R3129 Other microscopic hematuria: Secondary | ICD-10-CM

## 2023-03-23 DIAGNOSIS — N132 Hydronephrosis with renal and ureteral calculous obstruction: Secondary | ICD-10-CM | POA: Diagnosis not present

## 2023-03-23 DIAGNOSIS — R109 Unspecified abdominal pain: Secondary | ICD-10-CM | POA: Diagnosis not present

## 2023-03-23 DIAGNOSIS — R1032 Left lower quadrant pain: Secondary | ICD-10-CM | POA: Diagnosis not present

## 2023-03-23 LAB — URINALYSIS, COMPLETE
Bilirubin, UA: NEGATIVE
Glucose, UA: NEGATIVE
Ketones, UA: NEGATIVE
Nitrite, UA: NEGATIVE
Protein,UA: NEGATIVE
Specific Gravity, UA: 1.015 (ref 1.005–1.030)
Urobilinogen, Ur: 0.2 mg/dL (ref 0.2–1.0)
pH, UA: 7 (ref 5.0–7.5)

## 2023-03-23 LAB — MICROSCOPIC EXAMINATION

## 2023-03-23 NOTE — Progress Notes (Unsigned)
Surgical Physician Order Form [facility] [place]  * Scheduling expectation : Next Available  ? Monday  *Length of Case:   *Clearance needed: no  *Anticoagulation Instructions: Hold all anticoagulants  *Aspirin Instructions: Hold Aspirin  *Post-op visit Date/Instructions:  1 month with RUS prior  *Diagnosis: Left Ureteral Stone  *Procedure: left Ureteroscopy w/laser lithotripsy & stent placement (21308)   Additional orders: N/A  -Admit type: OUTpatient  -Anesthesia: General  -VTE Prophylaxis Standing Order SCD's       Other:   -Standing Lab Orders Per Anesthesia    Lab other: None  -Standing Test orders EKG/Chest x-ray per Anesthesia       Test other:   - Medications:  Ancef 2gm IV  -Other orders:  N/A

## 2023-03-23 NOTE — H&P (View-Only) (Signed)
 I, Jeremy Z Plume,acting as a scribe for Colon Rueth, MD.,have documented all relevant documentation on the behalf of Jordan Moeser, MD,as directed by  [name], MD while in the presence of [name], MD.  03/23/2023 2:24 PM   Jordan Zimmerman 12/29/1970 [id number]  Referring provider: Singh, Harmeet, MD [address] [place],  [place] [address]  Chief Complaint  Patient presents with   Establish Care   Hydronephrosis    HPI: 52 year-old female with a personal history of nephrolithiasis who presents today for further evaluation of an acute kidney injury and hydronephrosis. She was noted to have elevated creatinine to 1.17 on 03/02/2023 from the previous baseline of normal. It was as high as 1.34 2 months ago, but it was improving.   As part of her workup, she had a renal ultrasound on 01/26/2023, which showed mild right hydronephrosis and moderate left hydronephrosis. There was a stone seen in the left kidney. Her hydronephrosis did not resolve after voiding. No left ureteral jet was identified.   She does have a personal history of stones. She had shockwave lithotripsy by Dr. Wrenn in 2018.   Initially, she was referred to nephrology instead of urology. Once seen by the nephrologist, she was referred here.   Her urinalysis today showed 11-30 WBC's, 11-30 RBC's, moderate bacteria, and nitrite negative.   Today, she reports urinary urgency and frequency, as well as a sensation of a stone moving, particularly on the right side, although the left side shows more swelling and pain. She denies any dysuria at this time.  PMH: Past Medical History:  Diagnosis Date   Anxiety    Chronic fatigue    Fibromyalgia    History of kidney stones    IBS (irritable bowel syndrome)    Kidney stones    Migraines     Surgical History: Past Surgical History:  Procedure Laterality Date   CHOLECYSTECTOMY  2001   COLONOSCOPY N/A 07/21/2021   Procedure: COLONOSCOPY;  Surgeon:  Anna, Kiran, MD;  Location: [facility] ENDOSCOPY;  Service: Gastroenterology;  Laterality: N/A;   CYSTOSCOPY W/ URETEROSCOPY  ~ 2008   "pulled stone rest of the way out" (05/21/2013)   EXTRACORPOREAL SHOCK WAVE LITHOTRIPSY Right 10/06/2017   Procedure: RIGHT EXTRACORPOREAL SHOCK WAVE LITHOTRIPSY (ESWL);  Surgeon: Wrenn, John, MD;  Location: [facility];  Service: Urology;  Laterality: Right;   FOOT FRACTURE SURGERY Bilateral    TONSILLECTOMY  1992    Home Medications:  Allergies as of 03/23/2023       Reactions   Ambien [zolpidem Tartrate] Anaphylaxis   Amoxicillin Hives, Swelling   Fever.    Penicillins Hives, Other (See Comments)   High fever        Medication List        Accurate as of March 23, 2023  2:24 PM. If you have any questions, ask your nurse or doctor.          acetaminophen 500 MG tablet Commonly known as: TYLENOL Take 1,000 mg by mouth every 6 (six) hours as needed.   omeprazole 40 MG capsule Commonly known as: PRILOSEC Take 1 capsule (40 mg total) by mouth daily.   ondansetron 4 MG disintegrating tablet Commonly known as: Zofran ODT Take 1 tablet (4 mg total) by mouth every 8 (eight) hours as needed for nausea or vomiting.   propranolol ER 60 MG 24 hr capsule Commonly known as: INDERAL LA TAKE 1 CAPSULE BY MOUTH EVERY DAY   Qulipta 30 MG Tabs   Generic drug: Atogepant Take 1 tablet (30 mg total) by mouth daily.        Allergies:  Allergies  Allergen Reactions   Ambien [Zolpidem Tartrate] Anaphylaxis   Amoxicillin Hives and Swelling    Fever.    Penicillins Hives and Other (See Comments)    High fever    Family History: Family History  Problem Relation Age of Onset   Diabetes Mother    Hypertension Father    Diabetes Father    Prostate cancer Father    Breast cancer Maternal Grandmother    Breast cancer Maternal Aunt 45    Social History:  reports that she quit smoking about 28 years ago. Her smoking use included cigarettes. She has a 4.00  pack-year smoking history. She has never used smokeless tobacco. She reports current drug use. Drug: Marijuana. She reports that she does not drink alcohol.   Physical Exam: BP 130/81   Pulse 61   Ht 5' 5" (1.651 m)   Wt 139 lb (63 kg)   BMI 23.13 kg/m   Constitutional:  Alert and oriented, No acute distress. HEENT: [place] AT, moist mucus membranes.  Trachea midline, no masses. Neurologic: Grossly intact, no focal deficits, moving all 4 extremities. Psychiatric: Normal mood and affect.  Pertinent Imaging:  EXAM: RENAL / URINARY TRACT ULTRASOUND COMPLETE   COMPARISON:  None Available.   FINDINGS: Right Kidney:   Renal measurements: 9.9 x 5.6 x 6.0 cm = volume: 174 mL. Mild hydronephrosis   Left Kidney:   Renal measurements: 9.3 x 5.9 x 5.8 cm = volume: 166 mL. Moderate hydronephrosis. Contains a 5.4 mm stone.   Bladder:   Appears normal for degree of bladder distention.   Other:   None.   IMPRESSION: 1. Mild right and moderate left hydronephrosis. 2. 5.4 mm stone in the left kidney.     Electronically Signed   By: David  Williams III M.D.   On: 01/26/2023 16:32  This was personally reviewed and I agree with the radiologic interpretation.  Assessment & Plan:    1. Left great than right hydronephrosis - Given her history of stones, left flank pain, and abnormal urinalysis, highly suspicious that she is in fact passing a stone on the left side. Her creatinine is improved, but not all the way back to baseline.  - Stat CT stone protocol today. We will call her with the results and follow up plan  2. Abnormal urinalysis - We will send for a urine culture - Otherwise asymptomatic, may be related to hydronephrosis  I have reviewed the above documentation for accuracy and completeness, and I agree with the above.   Kazoua Gossen, MD    [facility] [address], [address] [place], [place] [address] [phone]  Addendum: CT scan  personally reviewed.  She has a 10 mm mid ureteral calculus with moderate left-sided hydroureteronephrosis.  She also has bilateral nonobstructing stones.  Called and left a message on the machine.  Patient had indicated in the office that if she had to have intervention for the stone, she be most interested in ureteroscopy as she did not do well with shockwave lithotripsy previously.  Risks and benefits of ureteroscopy were reviewed including but not limited to infection, bleeding, pain, ureteral injury which could require open surgery versus prolonged indwelling if ureteral perforation occurs, persistent stone disease, requirement for staged procedure, possible stent, and global anesthesia risks. Patient expressed understanding and desires to proceed with ureteroscopy.  

## 2023-03-23 NOTE — Progress Notes (Signed)
Marcelle Overlie Plume,acting as a scribe for Vanna Scotland, MD.,have documented all relevant documentation on the behalf of Vanna Scotland, MD,as directed by  Vanna Scotland, MD while in the presence of Vanna Scotland, MD.  03/23/2023 2:24 PM   Jordan Zimmerman 1971/07/16 [id number]  Referring provider: Mosetta Pigeon, MD [address] [place],  [place] [address]  Chief Complaint  Patient presents with   Establish Care   Hydronephrosis    HPI: 52 year-old female with a personal history of nephrolithiasis who presents today for further evaluation of an acute kidney injury and hydronephrosis. She was noted to have elevated creatinine to 1.17 on 03/02/2023 from the previous baseline of normal. It was as high as 1.34 2 months ago, but it was improving.   As part of her workup, she had a renal ultrasound on 01/26/2023, which showed mild right hydronephrosis and moderate left hydronephrosis. There was a stone seen in the left kidney. Her hydronephrosis did not resolve after voiding. No left ureteral jet was identified.   She does have a personal history of stones. She had shockwave lithotripsy by Dr. Annabell Howells in 2018.   Initially, she was referred to nephrology instead of urology. Once seen by the nephrologist, she was referred here.   Her urinalysis today showed 11-30 WBC's, 11-30 RBC's, moderate bacteria, and nitrite negative.   Today, she reports urinary urgency and frequency, as well as a sensation of a stone moving, particularly on the right side, although the left side shows more swelling and pain. She denies any dysuria at this time.  PMH: Past Medical History:  Diagnosis Date   Anxiety    Chronic fatigue    Fibromyalgia    History of kidney stones    IBS (irritable bowel syndrome)    Kidney stones    Migraines     Surgical History: Past Surgical History:  Procedure Laterality Date   CHOLECYSTECTOMY  2001   COLONOSCOPY N/A 07/21/2021   Procedure: COLONOSCOPY;  Surgeon:  [name], MD;  Location: [facility] ENDOSCOPY;  Service: Gastroenterology;  Laterality: N/A;   CYSTOSCOPY W/ URETEROSCOPY  ~ 2008   "pulled stone rest of the way out" (05/21/2013)   EXTRACORPOREAL SHOCK WAVE LITHOTRIPSY Right 10/06/2017   Procedure: RIGHT EXTRACORPOREAL SHOCK WAVE LITHOTRIPSY (ESWL);  Surgeon: Bjorn Pippin, MD;  Location: [facility];  Service: Urology;  Laterality: Right;   FOOT FRACTURE SURGERY Bilateral    TONSILLECTOMY  1992    Home Medications:  Allergies as of 03/23/2023       Reactions   Ambien [zolpidem Tartrate] Anaphylaxis   Amoxicillin Hives, Swelling   Fever.    Penicillins Hives, Other (See Comments)   High fever        Medication List        Accurate as of March 23, 2023  2:24 PM. If you have any questions, ask your nurse or doctor.          acetaminophen 500 MG tablet Commonly known as: TYLENOL Take 1,000 mg by mouth every 6 (six) hours as needed.   omeprazole 40 MG capsule Commonly known as: PRILOSEC Take 1 capsule (40 mg total) by mouth daily.   ondansetron 4 MG disintegrating tablet Commonly known as: Zofran ODT Take 1 tablet (4 mg total) by mouth every 8 (eight) hours as needed for nausea or vomiting.   propranolol ER 60 MG 24 hr capsule Commonly known as: INDERAL LA TAKE 1 CAPSULE BY MOUTH EVERY DAY   Qulipta 30 MG Tabs  Generic drug: Atogepant Take 1 tablet (30 mg total) by mouth daily.        Allergies:  Allergies  Allergen Reactions   Ambien [Zolpidem Tartrate] Anaphylaxis   Amoxicillin Hives and Swelling    Fever.    Penicillins Hives and Other (See Comments)    High fever    Family History: Family History  Problem Relation Age of Onset   Diabetes Mother    Hypertension Father    Diabetes Father    Prostate cancer Father    Breast cancer Maternal Grandmother    Breast cancer Maternal Aunt 57    Social History:  reports that she quit smoking about 28 years ago. Her smoking use included cigarettes. She has a 4.00  pack-year smoking history. She has never used smokeless tobacco. She reports current drug use. Drug: Marijuana. She reports that she does not drink alcohol.   Physical Exam: BP 130/81   Pulse 61   Ht  (1.651 m)   Wt 139 lb (63 kg)   BMI 23.13 kg/m   Constitutional:  Alert and oriented, No acute distress. HEENT: [place] AT, moist mucus membranes.  Trachea midline, no masses. Neurologic: Grossly intact, no focal deficits, moving all 4 extremities. Psychiatric: Normal mood and affect.  Pertinent Imaging:  EXAM: RENAL / URINARY TRACT ULTRASOUND COMPLETE   COMPARISON:  None Available.   FINDINGS: Right Kidney:   Renal measurements: 9.9 x 5.6 x 6.0 cm = volume: 174 mL. Mild hydronephrosis   Left Kidney:   Renal measurements: 9.3 x 5.9 x 5.8 cm = volume: 166 mL. Moderate hydronephrosis. Contains a 5.4 mm stone.   Bladder:   Appears normal for degree of bladder distention.   Other:   None.   IMPRESSION: 1. Mild right and moderate left hydronephrosis. 2. 5.4 mm stone in the left kidney.     Electronically Signed   By: Gerome Sam III M.D.   On: 01/26/2023 16:32  This was personally reviewed and I agree with the radiologic interpretation.  Assessment & Plan:    1. Left great than right hydronephrosis - Given her history of stones, left flank pain, and abnormal urinalysis, highly suspicious that she is in fact passing a stone on the left side. Her creatinine is improved, but not all the way back to baseline.  - Stat CT stone protocol today. We will call her with the results and follow up plan  2. Abnormal urinalysis - We will send for a urine culture - Otherwise asymptomatic, may be related to hydronephrosis  I have reviewed the above documentation for accuracy and completeness, and I agree with the above.   Vanna Scotland, MD    [facility] [address] [place], [place] [address] [phone]  Addendum: CT scan  personally reviewed.  She has a 10 mm mid ureteral calculus with moderate left-sided hydroureteronephrosis.  She also has bilateral nonobstructing stones.  Called and left a message on the machine.  Patient had indicated in the office that if she had to have intervention for the stone, she be most interested in ureteroscopy as she did not do well with shockwave lithotripsy previously.  Risks and benefits of ureteroscopy were reviewed including but not limited to infection, bleeding, pain, ureteral injury which could require open surgery versus prolonged indwelling if ureteral perforation occurs, persistent stone disease, requirement for staged procedure, possible stent, and global anesthesia risks. Patient expressed understanding and desires to proceed with ureteroscopy.

## 2023-03-24 ENCOUNTER — Telehealth: Payer: Self-pay

## 2023-03-24 NOTE — Telephone Encounter (Signed)
I spoke with Jordan Zimmerman. We have discussed possible surgery dates and Monday April 29th, 2024 was agreed upon by all parties. Patient given information about surgery date, what to expect pre-operatively and post operatively.  We discussed that a Pre-Admission Testing office will be calling to set up the pre-op visit that will take place prior to surgery, and that these appointments are typically done over the phone with a Pre-Admissions RN. Informed patient that our office will communicate any additional care to be provided after surgery. Patients questions or concerns were discussed during our call. Advised to call our office should there be any additional information, questions or concerns that arise. Patient verbalized understanding.

## 2023-03-24 NOTE — Progress Notes (Signed)
   [facility] Surgical Posting Form  Surgery Date: Date: 03/28/2023  Surgeon: Dr. Vanna Scotland, MD  Inpt ( No  )   Outpt (Yes)   Obs ( No  )   Diagnosis: N20.1 Left Ureteral Stone  -CPT: [phone]  Surgery: Left Ureteroscopy with Laser Lithotripsy and Stent Placement   Stop Anticoagulations: Yes and also hold ASA  Cardiac/Medical/Pulmonary Clearance needed: no  *Orders entered into EPIC  Date: 03/24/23   *Case booked in [place]  Date: 03/24/23  *Notified pt of Surgery: Date: 03/24/23  PRE-OP UA & CX: no  *Placed into Prior Authorization Work [place] Date: 03/24/23  Assistant/laser/rep:No

## 2023-03-25 LAB — CULTURE, URINE COMPREHENSIVE

## 2023-03-27 MED ORDER — LACTATED RINGERS IV SOLN: INTRAVENOUS | Status: DC

## 2023-03-27 MED ORDER — CEFAZOLIN SODIUM-DEXTROSE 2-4 GM/100ML-% IV SOLN
2.0000 g | INTRAVENOUS | Status: AC
  Administered 2023-03-28: 2 g via INTRAVENOUS

## 2023-03-27 MED ORDER — CHLORHEXIDINE GLUCONATE 0.12 % MT SOLN
15.0000 mL | Freq: Once | OROMUCOSAL | Status: AC
  Administered 2023-03-28: 15 mL via OROMUCOSAL

## 2023-03-27 MED ORDER — ORAL CARE MOUTH RINSE: 15.0000 mL | Freq: Once | OROMUCOSAL | Status: AC

## 2023-03-28 ENCOUNTER — Ambulatory Visit: Payer: BC Managed Care – PPO | Admitting: Anesthesiology

## 2023-03-28 ENCOUNTER — Ambulatory Visit: Payer: BC Managed Care – PPO

## 2023-03-28 ENCOUNTER — Encounter: Payer: Self-pay | Admitting: Urology

## 2023-03-28 ENCOUNTER — Encounter
Admission: RE | Disposition: A | Discharge status: Home / Self Care | Payer: Self-pay | Source: Home / Self Care | Attending: Urology

## 2023-03-28 ENCOUNTER — Ambulatory Visit
Admission: RE | Admit: 2023-03-28 | Discharge: 2023-03-28 | Disposition: A | Discharge status: Home / Self Care | Payer: BC Managed Care – PPO | Attending: Urology | Admitting: Urology

## 2023-03-28 ENCOUNTER — Other Ambulatory Visit: Payer: Self-pay

## 2023-03-28 DIAGNOSIS — Z87442 Personal history of urinary calculi: Secondary | ICD-10-CM | POA: Insufficient documentation

## 2023-03-28 DIAGNOSIS — Z87891 Personal history of nicotine dependence: Secondary | ICD-10-CM | POA: Insufficient documentation

## 2023-03-28 DIAGNOSIS — N132 Hydronephrosis with renal and ureteral calculous obstruction: Secondary | ICD-10-CM | POA: Diagnosis not present

## 2023-03-28 DIAGNOSIS — F419 Anxiety disorder, unspecified: Secondary | ICD-10-CM | POA: Diagnosis not present

## 2023-03-28 DIAGNOSIS — M797 Fibromyalgia: Secondary | ICD-10-CM | POA: Insufficient documentation

## 2023-03-28 DIAGNOSIS — N179 Acute kidney failure, unspecified: Secondary | ICD-10-CM | POA: Insufficient documentation

## 2023-03-28 DIAGNOSIS — R519 Headache, unspecified: Secondary | ICD-10-CM | POA: Insufficient documentation

## 2023-03-28 DIAGNOSIS — N1330 Unspecified hydronephrosis: Secondary | ICD-10-CM | POA: Diagnosis not present

## 2023-03-28 DIAGNOSIS — K589 Irritable bowel syndrome without diarrhea: Secondary | ICD-10-CM | POA: Diagnosis not present

## 2023-03-28 DIAGNOSIS — N201 Calculus of ureter: Secondary | ICD-10-CM

## 2023-03-28 DIAGNOSIS — R7989 Other specified abnormal findings of blood chemistry: Secondary | ICD-10-CM

## 2023-03-28 HISTORY — PX: CYSTOSCOPY/URETEROSCOPY/HOLMIUM LASER/STENT PLACEMENT: SHX6546

## 2023-03-28 LAB — POCT PREGNANCY, URINE: Preg Test, Ur: NEGATIVE

## 2023-03-28 SURGERY — CYSTOSCOPY/URETEROSCOPY/HOLMIUM LASER/STENT PLACEMENT
Anesthesia: General | Laterality: Left

## 2023-03-28 MED ORDER — KETOROLAC TROMETHAMINE 30 MG/ML IJ SOLN
INTRAMUSCULAR | Status: AC
  Filled 2023-03-28: qty 1

## 2023-03-28 MED ORDER — OXYBUTYNIN CHLORIDE 5 MG PO TABS: 5.0000 mg | ORAL_TABLET | Freq: Three times a day (TID) | ORAL | 0 refills | Status: DC | PRN

## 2023-03-28 MED ORDER — CHLORHEXIDINE GLUCONATE 0.12 % MT SOLN
OROMUCOSAL | Status: AC
  Filled 2023-03-28: qty 15

## 2023-03-28 MED ORDER — DEXAMETHASONE SODIUM PHOSPHATE 10 MG/ML IJ SOLN
INTRAMUSCULAR | Status: DC | PRN
  Administered 2023-03-28: 10 mg via INTRAVENOUS

## 2023-03-28 MED ORDER — DROPERIDOL 2.5 MG/ML IJ SOLN
0.6250 mg | Freq: Once | INTRAMUSCULAR | Status: AC | PRN
  Administered 2023-03-28: 0.625 mg via INTRAVENOUS

## 2023-03-28 MED ORDER — SUGAMMADEX SODIUM 200 MG/2ML IV SOLN
INTRAVENOUS | Status: DC | PRN
  Administered 2023-03-28: 200 mg via INTRAVENOUS

## 2023-03-28 MED ORDER — SODIUM CHLORIDE 0.9 % IR SOLN
Status: DC | PRN
  Administered 2023-03-28: 1000 mL via INTRAVESICAL

## 2023-03-28 MED ORDER — ROCURONIUM BROMIDE 10 MG/ML (PF) SYRINGE
PREFILLED_SYRINGE | INTRAVENOUS | Status: AC
  Filled 2023-03-28: qty 10

## 2023-03-28 MED ORDER — ACETAMINOPHEN 10 MG/ML IV SOLN
INTRAVENOUS | Status: AC
  Filled 2023-03-28: qty 100

## 2023-03-28 MED ORDER — IOHEXOL 180 MG/ML  SOLN
INTRAMUSCULAR | Status: DC | PRN
  Administered 2023-03-28: 20 mL

## 2023-03-28 MED ORDER — FENTANYL CITRATE (PF) 100 MCG/2ML IJ SOLN
INTRAMUSCULAR | Status: AC
  Filled 2023-03-28: qty 2

## 2023-03-28 MED ORDER — FENTANYL CITRATE (PF) 100 MCG/2ML IJ SOLN
INTRAMUSCULAR | Status: DC | PRN
  Administered 2023-03-28 (×3): 50 ug via INTRAVENOUS

## 2023-03-28 MED ORDER — DROPERIDOL 2.5 MG/ML IJ SOLN
INTRAMUSCULAR | Status: AC
  Filled 2023-03-28: qty 2

## 2023-03-28 MED ORDER — HYDROCODONE-ACETAMINOPHEN 5-325 MG PO TABS: 1.0000 | ORAL_TABLET | Freq: Four times a day (QID) | ORAL | 0 refills | Status: DC | PRN

## 2023-03-28 MED ORDER — OXYCODONE HCL 5 MG PO TABS
ORAL_TABLET | ORAL | Status: AC
  Filled 2023-03-28: qty 1

## 2023-03-28 MED ORDER — ONDANSETRON HCL 4 MG/2ML IJ SOLN
INTRAMUSCULAR | Status: AC
  Filled 2023-03-28: qty 2

## 2023-03-28 MED ORDER — FENTANYL CITRATE (PF) 100 MCG/2ML IJ SOLN: 25.0000 ug | INTRAMUSCULAR | Status: DC | PRN

## 2023-03-28 MED ORDER — DEXMEDETOMIDINE HCL IN NACL 80 MCG/20ML IV SOLN
INTRAVENOUS | Status: DC | PRN
  Administered 2023-03-28: 8 ug via INTRAVENOUS
  Administered 2023-03-28: 12 ug via INTRAVENOUS

## 2023-03-28 MED ORDER — CEFAZOLIN SODIUM-DEXTROSE 2-4 GM/100ML-% IV SOLN
INTRAVENOUS | Status: AC
  Filled 2023-03-28: qty 100

## 2023-03-28 MED ORDER — OXYCODONE HCL 5 MG PO TABS
5.0000 mg | ORAL_TABLET | Freq: Once | ORAL | Status: AC | PRN
  Administered 2023-03-28: 5 mg via ORAL

## 2023-03-28 MED ORDER — ACETAMINOPHEN 10 MG/ML IV SOLN
INTRAVENOUS | Status: DC | PRN
  Administered 2023-03-28: 1000 mg via INTRAVENOUS

## 2023-03-28 MED ORDER — MIDAZOLAM HCL 2 MG/2ML IJ SOLN
INTRAMUSCULAR | Status: DC | PRN
  Administered 2023-03-28: 2 mg via INTRAVENOUS

## 2023-03-28 MED ORDER — LIDOCAINE HCL (CARDIAC) PF 100 MG/5ML IV SOSY
PREFILLED_SYRINGE | INTRAVENOUS | Status: DC | PRN
  Administered 2023-03-28: 50 mg via INTRAVENOUS

## 2023-03-28 MED ORDER — DEXMEDETOMIDINE HCL IN NACL 80 MCG/20ML IV SOLN
INTRAVENOUS | Status: AC
  Filled 2023-03-28: qty 20

## 2023-03-28 MED ORDER — KETOROLAC TROMETHAMINE 30 MG/ML IJ SOLN
INTRAMUSCULAR | Status: DC | PRN
  Administered 2023-03-28: 15 mg via INTRAVENOUS

## 2023-03-28 MED ORDER — ROCURONIUM BROMIDE 100 MG/10ML IV SOLN
INTRAVENOUS | Status: DC | PRN
  Administered 2023-03-28: 50 mg via INTRAVENOUS

## 2023-03-28 MED ORDER — SEVOFLURANE IN SOLN
RESPIRATORY_TRACT | Status: AC
  Filled 2023-03-28: qty 250

## 2023-03-28 MED ORDER — PROMETHAZINE HCL 25 MG/ML IJ SOLN: 6.2500 mg | INTRAMUSCULAR | Status: DC | PRN

## 2023-03-28 MED ORDER — ACETAMINOPHEN 10 MG/ML IV SOLN: 1000.0000 mg | Freq: Once | INTRAVENOUS | Status: DC | PRN

## 2023-03-28 MED ORDER — TAMSULOSIN HCL 0.4 MG PO CAPS: 0.4000 mg | ORAL_CAPSULE | Freq: Every day | ORAL | 0 refills | Status: DC

## 2023-03-28 MED ORDER — PROPOFOL 10 MG/ML IV BOLUS
INTRAVENOUS | Status: DC | PRN
  Administered 2023-03-28: 200 mg via INTRAVENOUS

## 2023-03-28 MED ORDER — PROPOFOL 10 MG/ML IV BOLUS
INTRAVENOUS | Status: AC
  Filled 2023-03-28: qty 20

## 2023-03-28 MED ORDER — ONDANSETRON HCL 4 MG/2ML IJ SOLN
INTRAMUSCULAR | Status: DC | PRN
  Administered 2023-03-28: 4 mg via INTRAVENOUS

## 2023-03-28 MED ORDER — MIDAZOLAM HCL 2 MG/2ML IJ SOLN
INTRAMUSCULAR | Status: AC
  Filled 2023-03-28: qty 2

## 2023-03-28 MED ORDER — LIDOCAINE HCL (PF) 2 % IJ SOLN
INTRAMUSCULAR | Status: AC
  Filled 2023-03-28: qty 5

## 2023-03-28 MED ORDER — OXYCODONE HCL 5 MG/5ML PO SOLN: 5.0000 mg | Freq: Once | ORAL | Status: AC | PRN

## 2023-03-28 SURGICAL SUPPLY — 31 items
ADH LQ OCL WTPRF AMP STRL LF (MISCELLANEOUS) ×1
ADHESIVE MASTISOL STRL (MISCELLANEOUS) IMPLANT
BAG DRAIN SIEMENS DORNER NS (MISCELLANEOUS) ×1 IMPLANT
BAG DRN NS LF (MISCELLANEOUS) ×1
BASKET ZERO TIP 1.9FR (BASKET) IMPLANT
BRUSH SCRUB EZ 1% IODOPHOR (MISCELLANEOUS) ×1 IMPLANT
BSKT STON RTRVL ZERO TP 1.9FR (BASKET)
CATH URET FLEX-TIP 2 LUMEN 10F (CATHETERS) IMPLANT
CATH URETL OPEN 5X70 (CATHETERS) ×1 IMPLANT
CNTNR URN SCR LID CUP LEK RST (MISCELLANEOUS) IMPLANT
CONT SPEC 4OZ STRL OR WHT (MISCELLANEOUS)
DRAPE UTILITY 15X26 TOWEL STRL (DRAPES) ×1 IMPLANT
DRSG TEGADERM 2-3/8X2-3/4 SM (GAUZE/BANDAGES/DRESSINGS) IMPLANT
FIBER LASER MOSES 200 DFL (Laser) IMPLANT
GLOVE BIO SURGEON STRL SZ 6.5 (GLOVE) ×1 IMPLANT
GOWN STRL REUS W/ TWL LRG LVL3 (GOWN DISPOSABLE) ×2 IMPLANT
GOWN STRL REUS W/TWL LRG LVL3 (GOWN DISPOSABLE) ×2
GUIDEWIRE GREEN .038 145CM (MISCELLANEOUS) IMPLANT
GUIDEWIRE SENSOR ANG DUAL FLEX (WIRE) IMPLANT
GUIDEWIRE STR DUAL SENSOR (WIRE) ×1 IMPLANT
IV NS IRRIG 3000ML ARTHROMATIC (IV SOLUTION) ×1 IMPLANT
KIT TURNOVER CYSTO (KITS) ×1 IMPLANT
PACK CYSTO AR (MISCELLANEOUS) ×1 IMPLANT
SET CYSTO W/LG BORE CLAMP LF (SET/KITS/TRAYS/PACK) ×1 IMPLANT
SHEATH NAVIGATOR HD 12/14X36 (SHEATH) IMPLANT
STENT URET 6FRX24 CONTOUR (STENTS) IMPLANT
STENT URET 6FRX26 CONTOUR (STENTS) IMPLANT
SURGILUBE 2OZ TUBE FLIPTOP (MISCELLANEOUS) ×1 IMPLANT
Sensor Angle Tip IMPLANT
TRAP FLUID SMOKE EVACUATOR (MISCELLANEOUS) ×1 IMPLANT
WATER STERILE IRR 500ML POUR (IV SOLUTION) ×1 IMPLANT

## 2023-03-28 NOTE — Anesthesia Procedure Notes (Signed)
Procedure Name: Intubation Date/Time: 03/28/2023 2:01 PM  Performed by: Jeannene Patella, CRNAPre-anesthesia Checklist: Patient identified, Emergency Drugs available, Suction available, Patient being monitored and Timeout performed Patient Re-evaluated:Patient Re-evaluated prior to induction Oxygen Delivery Method: Circle system utilized Preoxygenation: Pre-oxygenation with 100% oxygen Induction Type: IV induction Ventilation: Mask ventilation without difficulty Laryngoscope Size: McGraph and 4 Grade View: Grade II Tube type: Oral Tube size: 6.5 mm Number of attempts: 1 Airway Equipment and Method: Stylet and Video-laryngoscopy Placement Confirmation: ETT inserted through vocal cords under direct vision, positive ETCO2 and breath sounds checked- equal and bilateral Secured at: 20 cm Tube secured with: Tape Dental Injury: Teeth and Oropharynx as per pre-operative assessment  Comments: Carious/broken front left upper 2 teeth remain as  preop s/p intubation

## 2023-03-28 NOTE — Discharge Instructions (Addendum)
You have a ureteral stent in place.  This is a tube that extends from your kidney to your bladder.  This may cause urinary bleeding, burning with urination, and urinary frequency.  Please call our office or present to the ED if you develop fevers >101 or pain which is not able to be controlled with oral pain medications.  You may be given either Flomax and/ or ditropan to help with bladder spasms and stent pain in addition to pain medications.    [facility] [address], [address] [place], [place] [address] [phone] AMBULATORY SURGERY  DISCHARGE INSTRUCTIONS   The drugs that you were given will stay in your system until tomorrow so for the next 24 hours you should not:  Drive an automobile Make any legal decisions Drink any alcoholic beverage   You may resume regular meals tomorrow.  Today it is better to start with liquids and gradually work up to solid foods.  You may eat anything you prefer, but it is better to start with liquids, then soup and crackers, and gradually work up to solid foods.   Please notify your doctor immediately if you have any unusual bleeding, trouble breathing, redness and pain at the surgery site, drainage, fever, or pain not relieved by medication.    Additional Instructions:        Please contact your physician with any problems or Same Day Surgery at [phone], Monday through Friday 6 am to 4 pm, or [facility] number at [phone].  

## 2023-03-28 NOTE — Anesthesia Preprocedure Evaluation (Addendum)
Anesthesia Evaluation  Patient identified by MRN, date of birth, ID band Patient awake    Reviewed: Allergy & Precautions, H&P , NPO status , Patient's Chart, lab work & pertinent test results  Airway Mallampati: II  TM Distance: >3 FB Neck ROM: full    Dental  (+) Chipped,    Pulmonary former smoker   Pulmonary exam normal        Cardiovascular negative cardio ROS Normal cardiovascular exam     Neuro/Psych  Headaches PSYCHIATRIC DISORDERS Anxiety        GI/Hepatic negative GI ROS, Neg liver ROS,,,  Endo/Other  negative endocrine ROS    Renal/GU Renal disease     Musculoskeletal  (+)  Fibromyalgia -  Abdominal Normal abdominal exam  (+)   Peds  Hematology negative hematology ROS (+)   Anesthesia Other Findings Left Ureteral Stone  Past Medical History: No date: Anxiety No date: Chronic fatigue No date: Fibromyalgia No date: History of kidney stones No date: IBS (irritable bowel syndrome) No date: Kidney stones No date: Migraines  Past Surgical History: 2001: CHOLECYSTECTOMY 07/21/2021: COLONOSCOPY; N/A     Comment:  Procedure: COLONOSCOPY;  Surgeon: [name], MD;                Location: [facility] ENDOSCOPY;  Service: Gastroenterology;                Laterality: N/A; ~ 2008: CYSTOSCOPY W/ URETEROSCOPY     Comment:  "pulled stone rest of the way out" (05/21/2013) 10/06/2017: EXTRACORPOREAL SHOCK WAVE LITHOTRIPSY; Right     Comment:  Procedure: RIGHT EXTRACORPOREAL SHOCK WAVE LITHOTRIPSY               (ESWL);  Surgeon: Bjorn Pippin, MD;  Location: [facility];                Service: Urology;  Laterality: Right; No date: FOOT FRACTURE SURGERY; Bilateral 1992: TONSILLECTOMY  BMI    Body Mass Index: 22.47 kg/m      Reproductive/Obstetrics negative OB ROS                             Anesthesia Physical Anesthesia Plan  ASA: 2  Anesthesia Plan: General ETT   Post-op Pain  Management: Ofirmev IV (intra-op)*   Induction: Intravenous  PONV Risk Score and Plan: 2 and Ondansetron, Dexamethasone and Midazolam  Airway Management Planned: Oral ETT  Additional Equipment:   Intra-op Plan:   Post-operative Plan: Extubation in OR  Informed Consent: I have reviewed the patients History and Physical, chart, labs and discussed the procedure including the risks, benefits and alternatives for the proposed anesthesia with the patient or authorized representative who has indicated his/her understanding and acceptance.     Dental Advisory Given  Plan Discussed with: CRNA and Surgeon  Anesthesia Plan Comments:         Anesthesia Quick Evaluation

## 2023-03-28 NOTE — Op Note (Signed)
Date of procedure: 03/28/23  Preoperative diagnosis:  Left ureteral calculus Left hydronephrosis Acute kidney injury  Postoperative diagnosis:  Same as above  Procedure: Left ureteroscopy with laser lithotripsy Left retrograde pyelogram Left ureteral stent placement Interpretation of fluoroscopy less than 30 minutes  Surgeon: Vanna Scotland, MD  Anesthesia: General  Complications: None  Intraoperative findings: High-grade proximal/mid ureteral obstruction from 1 cm stone with severe hydronephrosis.  Initial retrograde with no contrast above the level of the stone.  Some difficulty placing the wire around the stone but ultimately successful.  Stone fragmented.  Upper tract stone burden on CT was partially calcified fibrinous debris.  EBL: Minimal  Specimens: None  Drains: 6 x 24 French double-J ureteral stent on left  Indication: Jordan Zimmerman is a 52 y.o. patient with 1 cm left mid proximal ureteral stone with severe hydronephrosis.  After reviewing the management options for treatment, she elected to proceed with the above surgical procedure(s). We have discussed the potential benefits and risks of the procedure, side effects of the proposed treatment, the likelihood of the patient achieving the goals of the procedure, and any potential problems that might occur during the procedure or recuperation. Informed consent has been obtained.  Description of procedure:  The patient was taken to the operating room and general anesthesia was induced.  The patient was placed in the dorsal lithotomy position, prepped and draped in the usual sterile fashion, and preoperative antibiotics were administered. A preoperative time-out was performed.   A 21 French scope was advanced per urethra to the bladder.  On scout imaging, the stone in question could be easily seen in the mid proximal ureter.  I then performed a retrograde pyelogram and there was no contrast above the level of the stone  consistent with high-grade obstruction.  Had some difficulty initially getting a sensor wire around the stone and ultimately was able to achieve this using in the angled Glidewire tip.  The wire was placed on the way up to the level of the kidney.  I then was able to advance a 4.5 French semirigid ureteroscope up to the level of the stone.  A 200 m laser fiber was then used at dusting settings of 0.3 J and 60 Hz to fragment the very hard stone.  He was able to complete obliterate the stone such that the pieces were minute, approximately the tip of the laser fiber.  I was unable to advance the scope all the way up to the level of the proximal ureter.  Next, I used a Super Stiff wire through the scope as another working wire.  I exchanged the semirigid ureteroscope for a 7 French digital flexible ureteroscope all the way up to the level within the collecting system.  I then injected with contrast to create a outline roadmap.  I navigated to the lower pole where some stone debris could be seen on CT.  This however was not in fact stone, rather just fibrinous partially calcified debris.  There is no stone fragment at this level.  Each every calyx was then directly visualized.  Hydronephrosis was severe.  Finally, the scope was backed down the length the ureter inspected along the way.  A small amount of additional debris was fragmented.  Due to the high-grade obstruction and severe hydronephrosis, elected to place a ureteral stent.  A 6 x 24 French double-J ureteral stent was advanced over the safety wire up to the level of the kidney.  The wire was removed and a full  coil was noted both within the renal pelvis as well as within the bladder.  The bladder was then drained.  The patient was then cleaned and dried, repositioned in supine position, reversed of anesthesia, and taken to the PACU in stable condition.  Plan: Will have her return next week to the office for cystoscopy, stent removal.  Will plan for renal  ultrasound 6 weeks after her stent is removed.  Vanna Scotland, M.D.

## 2023-03-28 NOTE — Interval H&P Note (Signed)
History and Physical Interval Note:  03/28/2023 1:28 PM  [name]  has presented today for surgery, with the diagnosis of Left Ureteral Stone.  The various methods of treatment have been discussed with the patient and family. After consideration of risks, benefits and other options for treatment, the patient has consented to  Procedure(s): CYSTOSCOPY/URETEROSCOPY/HOLMIUM LASER/STENT PLACEMENT (Left) as a surgical intervention.  The patient's history has been reviewed, patient examined, no change in status, stable for surgery.  I have reviewed the patient's chart and labs.  Questions were answered to the patient's satisfaction.    RRR CTAB    Jordan Zimmerman

## 2023-03-28 NOTE — Transfer of Care (Signed)
Immediate Anesthesia Transfer of Care Note  Patient: Jordan Zimmerman  Procedure(s) Performed: CYSTOSCOPY/URETEROSCOPY/HOLMIUM LASER/STENT PLACEMENT (Left)  Patient Location: PACU  Anesthesia Type:General  Level of Consciousness: awake, drowsy, and patient cooperative  Airway & Oxygen Therapy: Patient Spontanous Breathing  Post-op Assessment: Report given to RN and Post -op Vital signs reviewed and stable  Post vital signs: Reviewed and stable  Last Vitals:  Vitals Value Taken Time  BP 98/55 03/28/23 1455  Temp 36.3 C 03/28/23 1455  Pulse 66 03/28/23 1456  Resp 15 03/28/23 1456  SpO2 98 % 03/28/23 1456  Vitals shown include unvalidated device data.  Last Pain:  Vitals:   03/28/23 1201  TempSrc: Oral         Complications: No notable events documented.

## 2023-03-29 ENCOUNTER — Ambulatory Visit: Payer: BC Managed Care – PPO | Admitting: Obstetrics & Gynecology

## 2023-03-29 ENCOUNTER — Encounter: Payer: Self-pay | Admitting: Urology

## 2023-03-29 NOTE — Anesthesia Postprocedure Evaluation (Signed)
Anesthesia Post Note  Patient: Jordan Zimmerman  Procedure(s) Performed: CYSTOSCOPY/URETEROSCOPY/HOLMIUM LASER/STENT PLACEMENT (Left)  Patient location during evaluation: PACU Anesthesia Type: General Level of consciousness: awake and alert, oriented and patient cooperative Pain management: pain level controlled Vital Signs Assessment: post-procedure vital signs reviewed and stable Respiratory status: spontaneous breathing, nonlabored ventilation and respiratory function stable Cardiovascular status: blood pressure returned to baseline and stable Postop Assessment: adequate PO intake Anesthetic complications: no   No notable events documented.   Last Vitals:  Vitals:   03/28/23 1540 03/28/23 1552  BP: 119/76 123/72  Pulse: (!) 56 (!) 54  Resp: 16 20  Temp:  (!) 36.1 C  SpO2: 96% 100%    Last Pain:  Vitals:   03/28/23 1552  TempSrc: Temporal  PainSc: 0-No pain                 Reed Breech

## 2023-03-30 ENCOUNTER — Encounter: Payer: Self-pay | Admitting: Urology

## 2023-03-31 ENCOUNTER — Encounter: Payer: Self-pay | Admitting: Urology

## 2023-03-31 ENCOUNTER — Other Ambulatory Visit: Payer: Self-pay | Admitting: Family

## 2023-03-31 DIAGNOSIS — M797 Fibromyalgia: Secondary | ICD-10-CM

## 2023-03-31 DIAGNOSIS — F4310 Post-traumatic stress disorder, unspecified: Secondary | ICD-10-CM

## 2023-03-31 DIAGNOSIS — G4452 New daily persistent headache (NDPH): Secondary | ICD-10-CM

## 2023-03-31 MED ORDER — ONDANSETRON 4 MG PO TBDP
4.0000 mg | ORAL_TABLET | Freq: Three times a day (TID) | ORAL | 0 refills | Status: DC | PRN
Start: 2023-03-31 — End: 2023-05-02

## 2023-04-04 ENCOUNTER — Encounter: Payer: Self-pay | Admitting: Family

## 2023-04-04 DIAGNOSIS — F418 Other specified anxiety disorders: Secondary | ICD-10-CM

## 2023-04-04 MED ORDER — DULOXETINE HCL 60 MG PO CPEP
60.0000 mg | ORAL_CAPSULE | Freq: Every day | ORAL | 3 refills | Status: DC
Start: 2023-04-04 — End: 2023-04-27

## 2023-04-06 ENCOUNTER — Ambulatory Visit (INDEPENDENT_AMBULATORY_CARE_PROVIDER_SITE_OTHER): Payer: BC Managed Care – PPO | Admitting: Urology

## 2023-04-06 VITALS — BP 114/75 | HR 90

## 2023-04-06 DIAGNOSIS — Z466 Encounter for fitting and adjustment of urinary device: Secondary | ICD-10-CM

## 2023-04-06 DIAGNOSIS — N201 Calculus of ureter: Secondary | ICD-10-CM

## 2023-04-06 LAB — URINALYSIS, COMPLETE
Bilirubin, UA: NEGATIVE
Glucose, UA: NEGATIVE
Nitrite, UA: NEGATIVE
Specific Gravity, UA: 1.01 (ref 1.005–1.030)
Urobilinogen, Ur: 0.2 mg/dL (ref 0.2–1.0)
pH, UA: 5.5 (ref 5.0–7.5)

## 2023-04-06 LAB — MICROSCOPIC EXAMINATION: Epithelial Cells (non renal): >10 /hpf — AB (ref 0–10)

## 2023-04-06 MED ORDER — SULFAMETHOXAZOLE-TRIMETHOPRIM 800-160 MG PO TABS
1.0000 | ORAL_TABLET | Freq: Once | ORAL | Status: AC
Start: 2023-04-06 — End: 2023-04-06
  Administered 2023-04-06: 1 via ORAL

## 2023-04-06 NOTE — Progress Notes (Signed)
   04/06/23  CC:  Chief Complaint  Patient presents with   Cysto Stent Removal    HPI: 52 year old female with high-grade obstruction from left ureteral stone.  She returns today for office cystoscopy stent removal.  She has been having difficulty tolerating the stent.  Blood pressure 114/75, pulse 90. NED. A&Ox3.   No respiratory distress   Abd soft, NT, ND Normal external genitalia with patent urethral meatus  Cystoscopy/ Stent removal procedure  Patient identification was confirmed, informed consent was obtained, and patient was prepped using Betadine solution.  Lidocaine jelly was administered per urethral meatus.    Preoperative abx where received prior to procedure.    Procedure: - Flexible cystoscope introduced, without any difficulty.   - Thorough search of the bladder revealed:    normal urethral meatus  Stent seen emanating from left ureteral orifice, grasped with stent graspers, and removed in entirety.     Post-Procedure: - Patient tolerated the procedure well   Assessment/ Plan:  1. Encounter for removal of ureteral stent Stent removed today without difficulty  We discussed post stent precautions including signs symptoms of infection  She did receive prophylactic antibiotics today  - sulfamethoxazole-trimethoprim (BACTRIM DS) 800-160 MG per tablet 1 tablet - Urinalysis, Complete - Ultrasound renal complete; Future  2. Left ureteral stone Status post ureteroscopy for likely chronic high-grade obstructing stone  Plan for follow-up renal ultrasound in 6 weeks  - Ultrasound renal complete; Future   Vanna Scotland, MD

## 2023-04-11 NOTE — Telephone Encounter (Signed)
I just spoke with Abby at [facility]. She did say the medication was on hold, but was not sure why. She will get this medication ready for the patient for pick up today. I sent a MyChart message alerting Naavya.

## 2023-04-11 NOTE — Telephone Encounter (Signed)
Can you verbally call in RX for cymbalta? I see it was sent 5/6 but pt states not available.

## 2023-04-19 ENCOUNTER — Other Ambulatory Visit: Payer: Self-pay | Admitting: Urology

## 2023-04-21 ENCOUNTER — Encounter: Payer: Self-pay | Admitting: Urology

## 2023-04-27 ENCOUNTER — Ambulatory Visit: Payer: BC Managed Care – PPO | Admitting: Urology

## 2023-04-27 ENCOUNTER — Ambulatory Visit (INDEPENDENT_AMBULATORY_CARE_PROVIDER_SITE_OTHER): Payer: BC Managed Care – PPO | Admitting: Urology

## 2023-04-27 ENCOUNTER — Ambulatory Visit
Admission: RE | Admit: 2023-04-27 | Discharge: 2023-04-27 | Disposition: A | Discharge status: Home / Self Care | Payer: BC Managed Care – PPO | Source: Ambulatory Visit | Attending: Urology | Admitting: Urology

## 2023-04-27 VITALS — BP 132/85 | HR 73 | Ht 65.0 in | Wt 133.4 lb

## 2023-04-27 DIAGNOSIS — R109 Unspecified abdominal pain: Secondary | ICD-10-CM

## 2023-04-27 DIAGNOSIS — R3915 Urgency of urination: Secondary | ICD-10-CM

## 2023-04-27 DIAGNOSIS — N201 Calculus of ureter: Secondary | ICD-10-CM

## 2023-04-27 DIAGNOSIS — N200 Calculus of kidney: Secondary | ICD-10-CM

## 2023-04-27 LAB — MICROSCOPIC EXAMINATION

## 2023-04-27 LAB — URINALYSIS, COMPLETE
Bilirubin, UA: NEGATIVE
Glucose, UA: NEGATIVE
Ketones, UA: NEGATIVE
Nitrite, UA: NEGATIVE
Protein,UA: NEGATIVE
Specific Gravity, UA: 1.01 (ref 1.005–1.030)
Urobilinogen, Ur: 0.2 mg/dL (ref 0.2–1.0)
pH, UA: 5.5 (ref 5.0–7.5)

## 2023-04-27 LAB — BLADDER SCAN AMB NON-IMAGING: Scan Result: 35

## 2023-04-27 NOTE — Progress Notes (Signed)
Marcelle Overlie Plume,acting as a scribe for Vanna Scotland, MD.,have documented all relevant documentation on the behalf of Vanna Scotland, MD,as directed by  Vanna Scotland, MD while in the presence of Vanna Scotland, MD.  04/27/2023 4:57 PM   Jordan Zimmerman 1971-05-14 [id number]  Referring provider: Mort Sawyers, FNP [address] [place],  [place] [address]  Chief Complaint  Patient presents with   Urinary Urgency    HPI: 52 year-old female who is status post ureteroscopy for high grade ureteral obstructing stone. She had her stent removed on 04/06/2023. She is scheduled in a few weeks from now for a follow up renal ultrasound.   Today, she reports right flank pain, describing it as a swelling sensation, akin to having a balloon in the area. Imaging shows a non-obstructing up to 6 mm stone in the right kidney. This discomfort has worsened over the past couple of weeks and is accompanied by symptoms similar to those she experienced with her kidney stones. Additionally, she notes bilateral puffy ankles, which she associates with kidney function. She has experienced nausea and vomiting, which initially subsided post-surgery but have recurred in the past couple of weeks. She is also complaining of urgency, frequency, and leakage.  Results for orders placed or performed in visit on 04/27/23  Microscopic Examination   Urine  Result Value Ref Range   WBC, UA 11-30 (A) 0 - 5 /hpf   RBC, Urine 3-10 (A) 0 - 2 /hpf   Epithelial Cells (non renal) 0-10 0 - 10 /hpf   Crystals Present (A) N/A   Crystal Type Amorphous Sediment N/A   Mucus, UA Present (A) Not Estab.   Bacteria, UA Moderate (A) None seen/Few  Urinalysis, Complete  Result Value Ref Range   Specific Gravity, UA 1.010 1.005 - 1.030   pH, UA 5.5 5.0 - 7.5   Color, UA Yellow Yellow   Appearance Ur Clear Clear   Leukocytes,UA 1+ (A) Negative   Protein,UA Negative Negative/Trace   Glucose, UA Negative Negative   Ketones, UA  Negative Negative   RBC, UA 1+ (A) Negative   Bilirubin, UA Negative Negative   Urobilinogen, Ur 0.2 0.2 - 1.0 mg/dL   Nitrite, UA Negative Negative   Microscopic Examination See below:   Bladder Scan (Post Void Residual) in office  Result Value Ref Range   Scan Result 35 ml     PMH: Past Medical History:  Diagnosis Date   Anxiety    Chronic fatigue    Fibromyalgia    History of kidney stones    IBS (irritable bowel syndrome)    Kidney stones    Migraines     Surgical History: Past Surgical History:  Procedure Laterality Date   CHOLECYSTECTOMY  2001   COLONOSCOPY N/A 07/21/2021   Procedure: COLONOSCOPY;  Surgeon: [name], MD;  Location: [facility] ENDOSCOPY;  Service: Gastroenterology;  Laterality: N/A;   CYSTOSCOPY W/ URETEROSCOPY  ~ 2008   "pulled stone rest of the way out" (05/21/2013)   CYSTOSCOPY/URETEROSCOPY/HOLMIUM LASER/STENT PLACEMENT Left 03/28/2023   Procedure: CYSTOSCOPY/URETEROSCOPY/HOLMIUM LASER/STENT PLACEMENT;  Surgeon: Vanna Scotland, MD;  Location: [facility] ORS;  Service: Urology;  Laterality: Left;   EXTRACORPOREAL SHOCK WAVE LITHOTRIPSY Right 10/06/2017   Procedure: RIGHT EXTRACORPOREAL SHOCK WAVE LITHOTRIPSY (ESWL);  Surgeon: Bjorn Pippin, MD;  Location: [facility] ORS;  Service: Urology;  Laterality: Right;   FOOT FRACTURE SURGERY Bilateral    TONSILLECTOMY  1992    Home Medications:  Allergies as of 04/27/2023  Reactions   Ambien [zolpidem Tartrate] Anaphylaxis   Amoxicillin Hives, Swelling   Fever.    Penicillins Hives, Other (See Comments)   High fever        Medication List        Accurate as of Apr 27, 2023  4:57 PM. If you have any questions, ask your nurse or doctor.          STOP taking these medications    DULoxetine 60 MG capsule Commonly known as: Cymbalta   oxybutynin 5 MG tablet Commonly known as: DITROPAN   tamsulosin 0.4 MG Caps capsule Commonly known as: Flomax       TAKE these medications    acetaminophen 500 MG  tablet Commonly known as: TYLENOL Take 1,000 mg by mouth every 6 (six) hours as needed.   omeprazole 40 MG capsule Commonly known as: PRILOSEC Take 1 capsule (40 mg total) by mouth daily.   ondansetron 4 MG disintegrating tablet Commonly known as: Zofran ODT Take 1 tablet (4 mg total) by mouth every 8 (eight) hours as needed for nausea or vomiting.   propranolol ER 60 MG 24 hr capsule Commonly known as: INDERAL LA TAKE 1 CAPSULE BY MOUTH EVERY DAY   Qulipta 30 MG Tabs Generic drug: Atogepant Take 1 tablet (30 mg total) by mouth daily.        Allergies:  Allergies  Allergen Reactions   Ambien [Zolpidem Tartrate] Anaphylaxis   Amoxicillin Hives and Swelling    Fever.    Penicillins Hives and Other (See Comments)    High fever    Family History: Family History  Problem Relation Age of Onset   Diabetes Mother    Hypertension Father    Diabetes Father    Prostate cancer Father    Breast cancer Maternal Grandmother    Breast cancer Maternal Aunt 26    Social History:  reports that she quit smoking about 28 years ago. Her smoking use included cigarettes. She has a 4.00 pack-year smoking history. She has never used smokeless tobacco. She reports current drug use. Drug: Marijuana. She reports that she does not drink alcohol.   Physical Exam: BP 132/85   Pulse 73   Ht 5\' 5"  (1.651 m)   Wt 133 lb 6 oz (60.5 kg)   BMI 22.19 kg/m   Constitutional:  Alert and oriented, No acute distress. HEENT: [place] AT, moist mucus membranes.  Trachea midline, no masses. Neurologic: Grossly intact, no focal deficits, moving all 4 extremities. Psychiatric: Normal mood and affect.   Pertinent Imaging:  CT RENAL STONE STUDY  Narrative CLINICAL DATA:  Abdominal/flank pain, stone suspected hydronephrosis, left flank pain, microscopic hematuria, hx kidney stone.  EXAM: CT ABDOMEN AND PELVIS WITHOUT CONTRAST  TECHNIQUE: Multidetector CT imaging of the abdomen and pelvis was  performed following the standard protocol without IV contrast.  RADIATION DOSE REDUCTION: This exam was performed according to the departmental dose-optimization program which includes automated exposure control, adjustment of the mA and/or kV according to patient size and/or use of iterative reconstruction technique.  COMPARISON:  CT abdomen/pelvis 10/01/2017.  FINDINGS: Lower chest: No acute abnormality.  Hepatobiliary: No focal liver abnormality is seen. Status post cholecystectomy. No biliary dilatation.  Pancreas: Unremarkable. No pancreatic ductal dilatation or surrounding inflammatory changes.  Spleen: Normal in size without focal abnormality.  Adrenals/Urinary Tract: Adrenal glands are unremarkable. Moderate left hydroureteronephrosis with 10 mm stone in the mid left ureter (image 43 series 2). Additional nonobstructing calculi are seen in both kidneys,  measuring up to 6 mm on the right (image 32 series 2). Bladder is unremarkable.  Stomach/Bowel: Normal stomach and duodenum. No dilated loops of small bowel. Appendix is not visualized. Redundant transverse colon. No bowel wall thickening or surrounding inflammation.  Vascular/Lymphatic: No significant vascular findings are present. No enlarged abdominal or pelvic lymph nodes.  Reproductive: IUD in place.  Adnexa are unremarkable.  Other: No abdominal wall hernia or abnormality. No abdominopelvic ascites.  Musculoskeletal: No acute or significant osseous findings.  IMPRESSION: 1. Moderate left hydroureteronephrosis with 10 mm stone in the mid left ureter. 2. Additional nonobstructing calculi are seen in both kidneys, measuring up to 6 mm on the right.   Electronically Signed By: Orvan Falconer M.D. On: 03/23/2023 15:32  This was personally reviewed and I agree with the radiologic interpretation.  Assessment & Plan:    1. Right flank pain - Likely related to a non-obstructing 6 mm stone in the right  kidney - Plan to obtain KUB to assess the current position of the stone  2. History of high grade left ureteral obstruction - Status post ureteroscopy - Follow up with scheduled ultrasound in a few weeks to evaluate the left kidney's recovery and ensure the swelling has subsided  3. Possible Cystitis - Urinalysis shows signs consistent with recent stent removal and bladder irritation.  - A urine culture will be sent to rule out bacterial infection.  Return for renal ultrasound.   [facility] [address], [address] [place], [place] [address] [phone]

## 2023-05-02 ENCOUNTER — Other Ambulatory Visit: Payer: Self-pay | Admitting: *Deleted

## 2023-05-02 DIAGNOSIS — G4452 New daily persistent headache (NDPH): Secondary | ICD-10-CM

## 2023-05-02 LAB — CULTURE, URINE COMPREHENSIVE

## 2023-05-02 MED ORDER — ONDANSETRON 4 MG PO TBDP
4.0000 mg | ORAL_TABLET | Freq: Three times a day (TID) | ORAL | 0 refills | Status: DC | PRN
Start: 2023-05-02 — End: 2023-06-29

## 2023-05-02 NOTE — Telephone Encounter (Signed)
From: [name] To: Office of [place], [place] Sent: 04/30/2023 10:50 AM EDT Subject: Medication Renewal Request  Refills have been requested for the following medications:   ondansetron (ZOFRAN ODT) 4 MG disintegrating tablet [Samantha Vaillancourt]  Preferred pharmacy: [id number] [place], [place] - [address] Delivery method: Daryll Drown

## 2023-05-09 ENCOUNTER — Encounter: Payer: Self-pay | Admitting: Psychiatry

## 2023-05-12 ENCOUNTER — Ambulatory Visit: Payer: BC Managed Care – PPO | Attending: Internal Medicine | Admitting: Internal Medicine

## 2023-05-12 ENCOUNTER — Encounter: Payer: Self-pay | Admitting: Internal Medicine

## 2023-05-12 VITALS — BP 112/75 | HR 57 | Resp 14 | Ht 63.5 in | Wt 142.0 lb

## 2023-05-12 DIAGNOSIS — R768 Other specified abnormal immunological findings in serum: Secondary | ICD-10-CM

## 2023-05-12 DIAGNOSIS — R700 Elevated erythrocyte sedimentation rate: Secondary | ICD-10-CM | POA: Diagnosis not present

## 2023-05-12 DIAGNOSIS — M797 Fibromyalgia: Secondary | ICD-10-CM | POA: Diagnosis not present

## 2023-05-12 DIAGNOSIS — M25571 Pain in right ankle and joints of right foot: Secondary | ICD-10-CM

## 2023-05-12 DIAGNOSIS — M25572 Pain in left ankle and joints of left foot: Secondary | ICD-10-CM

## 2023-05-12 DIAGNOSIS — G8929 Other chronic pain: Secondary | ICD-10-CM

## 2023-05-12 NOTE — Patient Instructions (Addendum)
I am checking for more specific markers of autoimmune disease to evaluate this positive ANA test result.  I am repeating the high sedimentation rate test to see if this remains high.  I recommend checking out the [place] [facility] [place] patient-centered guide for fibromyalgia and chronic pain management: [email or web address]

## 2023-05-12 NOTE — Progress Notes (Signed)
Office Visit Note  Patient: Jordan Zimmerman             Date of Birth: December 22, 1970           MRN: [id number]             PCP: Mort Sawyers, FNP Referring: Mort Sawyers, FNP Visit Date: 05/12/2023   Subjective:  New Patient (Initial Visit) (Patient states this past week her ankles have been waking her up with pain. Patient states she has broken both ankles in the past. Patient states she has pain in her shoulders, elbows, and hips. )   History of Present Illness: Jordan Zimmerman is a 52 y.o. female here for evaluation of positive ANA and elevated sedimentation rate associated with chronic joint pain in multiple areas.  She has a longstanding history of fibromyalgia syndrome originally diagnosed after developing multiple joint pains following complicated pregnancy in 1997.  Ongoing joint pain and stiffness in multiple areas persistent and ultimately was diagnosed as fibromyalgia in 1999.  She has been treated with gabapentin that was beneficial but saw waning efficacy requiring dose titration and had associated weight gain side effect.  Subsequently discontinued the medicine.  She is also been on Cymbalta for years previously at very high dosing but recently decreased back to 60 mg daily.  Also had ongoing associated symptoms with brain fog with concentration and short-term recall difficulty, frequent migraine headaches, fatigue, irritable bowel syndrome with constipation predominant. She had a syncopal episode in 2019 with fall and sustained mildly displaced bilateral fibular fractures.  These were treated with internal fixation subsequently had healed up without major complication.  But now has increased pain particularly in the past 6 months or so and in the past few weeks is severe enough to wake her from sleep.  Has visible swelling in ankles when she wakes up in the morning but does not noticeably worsen throughout the day.  Also having some increased joint pain in multiple areas involving shoulders  elbows hands hips as well.  None of these with visible swelling or changes.  She has not noticed any redness or warmth in affected areas. She has dry eyes and mouth this is pretty chronic problem and uses lubricating eyedrops as needed and is frequently chewing gums or lozenges for dryness.  Occasionally gets painful ulcers on the side of the tongue.  She has had some erythematous skin rash on the forearms during these months lasting for up to a week at the time not particularly painful or itchy.  She cannot recall any specific association. Reports increased scalp itching but no visible rashes and no areas of hair loss.  No cervical lymphadenopathy.  No Raynaud's symptoms.  No history of abnormal bleeding or blood clots.  Labs reviewed 01/2023 ANA pos ESR 32 RF neg  Activities of Daily Living:  Patient reports morning stiffness for 24 hour.   Patient Reports nocturnal pain.  Difficulty dressing/grooming: Reports Difficulty climbing stairs: Reports Difficulty getting out of chair: Denies Difficulty using hands for taps, buttons, cutlery, and/or writing: Reports  Review of Systems  Constitutional:  Positive for fatigue.  HENT:  Negative for mouth sores and mouth dryness.   Eyes:  Positive for dryness.  Respiratory:  Negative for shortness of breath.   Cardiovascular:  Negative for chest pain and palpitations.  Gastrointestinal:  Positive for constipation. Negative for blood in stool and diarrhea.  Endocrine: Positive for increased urination.  Genitourinary:  Negative for involuntary urination.  Musculoskeletal:  Positive for joint  pain, gait problem, joint pain, joint swelling, myalgias, muscle weakness, morning stiffness, muscle tenderness and myalgias.  Skin:  Positive for sensitivity to sunlight. Negative for color change, rash and hair loss.  Allergic/Immunologic: Negative for susceptible to infections.  Neurological:  Positive for dizziness and headaches.  Hematological:  Negative  for swollen glands.  Psychiatric/Behavioral:  Positive for depressed mood and sleep disturbance. The patient is nervous/anxious.     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   Kidney stone 06/19/2018   Depression with anxiety 06/19/2018   Bilateral ankle pain 01/26/2018   PTSD (post-traumatic stress disorder) 11/09/2017   Fibromyalgia 05/21/2013   Irritable bowel syndrome 05/21/2013    Past Medical History:  Diagnosis Date   Anxiety    Chronic fatigue    Fibromyalgia    History of kidney stones    IBS (irritable bowel syndrome)    Kidney stones    Migraines     Family History  Problem Relation Age of Onset   Diabetes Mother    Hypertension Father    Diabetes Father    Prostate cancer Father    Other Brother        Disk Disease   Breast cancer Maternal Grandmother    Breast cancer Maternal Aunt 18   Past Surgical History:  Procedure Laterality Date   CHOLECYSTECTOMY  2001   COLONOSCOPY N/A 07/21/2021   Procedure: COLONOSCOPY;  Surgeon: [name], MD;  [facility] ENDOSCOPY;  Service: Gastroenterology;  Laterality: N/A;   CYSTOSCOPY W/ URETEROSCOPY  ~ 2008   "pulled stone rest of the way out" (05/21/2013)   CYSTOSCOPY/URETEROSCOPY/HOLMIUM LASER/STENT PLACEMENT Left 03/28/2023   Procedure: CYSTOSCOPY/URETEROSCOPY/HOLMIUM LASER/STENT PLACEMENT;  Surgeon: Vanna Scotland, MD;  Location: [facility] ORS;  Service: Urology;  Laterality: Left;   EXTRACORPOREAL SHOCK WAVE LITHOTRIPSY Right 10/06/2017   Procedure: RIGHT EXTRACORPOREAL SHOCK  WAVE LITHOTRIPSY (ESWL);  Surgeon: Bjorn Pippin, MD;  Location: [facility] ORS;  Service: Urology;  Laterality: Right;   FOOT FRACTURE SURGERY Bilateral    TONSILLECTOMY  1992   Social History   Social History Narrative   03/09/21   From: the area   Living: with husband, Fayrene Fearing (1994) and daughter   Work: homemaker      Family: John and Psychiatric nurse (married and lives nearby) - 2 grandchildren      Enjoys: garden      Exercise: pool in the weather is nice, walking   Diet: IBS limits her options       Safety   Seat belts: Yes    Guns: Yes  and secure   Safe in relationships: Yes    Immunization History  Administered Date(s) Administered   Tdap 06/03/2017     Objective: Vital Signs: BP 112/75 (BP Location: Right Arm, Patient Position: Sitting, Cuff Size: Normal)   Pulse (!) 57   Resp 14   Ht 5' 3.5" (1.613 m)   Wt 142 lb (64.4 kg)   BMI 24.76 kg/m    Physical Exam HENT:     Right Ear: External ear normal.  Left Ear: External ear normal.     Mouth/Throat:     Mouth: Mucous membranes are moist.     Pharynx: Oropharynx is clear.  Eyes:     Conjunctiva/sclera: Conjunctivae normal.  Cardiovascular:     Rate and Rhythm: Normal rate and regular rhythm.  Pulmonary:     Effort: Pulmonary effort is normal.     Breath sounds: Normal breath sounds.  Musculoskeletal:     Right lower leg: No edema.     Left lower leg: No edema.  Lymphadenopathy:     Cervical: No cervical adenopathy.  Skin:    General: Skin is warm and dry.     Findings: No rash.     Comments: Normal-appearing nailfold capillaroscopy  Neurological:     Mental Status: She is alert.  Psychiatric:        Mood and Affect: Mood normal.      Musculoskeletal Exam:  Generalized soreness to pressure over multiple joints throughout shoulders, elbows, fingers, hips, knees, and ankles No appreciable Bouchard's or Heberden's nodes No palpable synovitis Range of motion is intact throughout Tenderness to pressure in upper  and lower back without radiation   Investigation: No additional findings.  Imaging: Abdomen 1 view (KUB)  Result Date: 04/29/2023 CLINICAL DATA:  Right flank pain. EXAM: ABDOMEN - 1 VIEW COMPARISON:  10/06/2017 FINDINGS: The bowel gas pattern is normal. 7 mm calcific density overlies the right kidney. There are cholecystectomy clips. IMPRESSION: Right-sided calcification consistent with a stone. Electronically Signed   By: Layla Maw M.D.   On: 04/29/2023 22:08    Recent Labs: Lab Results  Component Value Date   WBC 11.1 (H) 01/28/2023   HGB 13.2 01/28/2023   PLT 393.0 01/28/2023   NA 141 01/19/2023   K 4.2 01/19/2023   CL 106 01/19/2023   CO2 28 01/19/2023   GLUCOSE 111 (H) 01/19/2023   BUN 16 01/19/2023   CREATININE 1.28 (H) 01/19/2023   BILITOT 0.3 06/22/2021   ALKPHOS 67 06/22/2021   AST 13 06/22/2021   ALT 6 06/22/2021   PROT 7.0 06/22/2021   ALBUMIN 4.4 06/22/2021   CALCIUM 10.1 01/19/2023   GFRAA >60 10/01/2017    Speciality Comments: No specialty comments available.  Procedures:  No procedures performed Allergies: Ambien [zolpidem tartrate], Amoxicillin, and Penicillins   Assessment / Plan:     Visit Diagnoses: Positive ANA (antinuclear antibody) - Plan: RNP Antibody, Anti-Smith antibody, Sjogrens syndrome-A extractable nuclear antibody, Sjogrens syndrome-B extractable nuclear antibody, Anti-DNA antibody, double-stranded, C3 and C4, Sedimentation rate, Chromatin (Nucleosomal) Antibody, ANA  Positive ANA and elevated sed rate but no specific criteria for autoimmune connective tissue disease no peripheral joint synovitis appreciable on exam.  Checking specific antibodies panel also rechecking ANA titer and pattern today.  If significantly abnormal could consider trial of your medication but low suspicion for this.  Think her chronic pain complaints are more likely explained by some osteoarthritis including secondary posttraumatic arthritis at sites including the  ankles history of fibromyalgia syndrome.  Elevated sed rate  Rechecking sedimentation rate today.  Fibromyalgia  Discussed briefly fibromyalgia has chronic pain in multiple areas also associated symptoms.  Currently on Cymbalta 60 mg daily.  Provided link to review online fibromyalgia patient guide for some self-management recommendations.  Chronic pain of both ankles  Tenderness to pressure previous injury history requiring internal fixation may have pain associated with decreased mobility or secondary osteoarthritis.  Currently does not have significant peripheral edema to account for this swelling either.  If problems progressively worsening right knee reassessment with orthopedics.  Orders: Orders Placed This Encounter  Procedures   RNP Antibody   Anti-Smith antibody   Sjogrens syndrome-A extractable nuclear antibody   Sjogrens syndrome-B extractable nuclear antibody   Anti-DNA antibody, double-stranded   C3 and C4   Sedimentation rate   Chromatin (Nucleosomal) Antibody   ANA   No orders of the defined types were placed in this encounter.    Follow-Up Instructions: No follow-ups on file.   Fuller Plan, MD  Note - This record has been created using [employer].  Chart creation errors have been sought, but may not always  have been located. Such creation errors do not reflect on  the standard of medical care.

## 2023-05-13 LAB — CHROMATIN (NUCLEOSOMAL) ANTIBODY: Chromatin (Nucleosomal) Antibody: <1 AI

## 2023-05-13 LAB — ANTI-DNA ANTIBODY, DOUBLE-STRANDED: ds DNA Ab: 1 IU/mL

## 2023-05-13 LAB — SEDIMENTATION RATE: Sed Rate: 2 mm/h (ref 0–20)

## 2023-05-13 LAB — C3 AND C4: C3 Complement: 85 mg/dL (ref 83–193)

## 2023-05-14 LAB — ANA: Anti Nuclear Antibody (ANA): POSITIVE — AB

## 2023-05-14 LAB — ANTI-SMITH ANTIBODY: ENA SM Ab Ser-aCnc: <1 AI

## 2023-05-14 LAB — ANTI-NUCLEAR AB-TITER (ANA TITER): ANA Titer 1: 1:40 {titer} — ABNORMAL HIGH

## 2023-05-14 LAB — C3 AND C4: C4 Complement: 18 mg/dL (ref 15–57)

## 2023-05-14 LAB — SJOGRENS SYNDROME-A EXTRACTABLE NUCLEAR ANTIBODY: SSA (Ro) (ENA) Antibody, IgG: <1 AI

## 2023-05-14 LAB — SJOGRENS SYNDROME-B EXTRACTABLE NUCLEAR ANTIBODY: SSB (La) (ENA) Antibody, IgG: <1 AI

## 2023-05-14 LAB — RNP ANTIBODY: Ribonucleic Protein(ENA) Antibody, IgG: <1 AI

## 2023-05-27 ENCOUNTER — Ambulatory Visit
Admission: RE | Admit: 2023-05-27 | Discharge: 2023-05-27 | Disposition: A | Discharge status: Home / Self Care | Payer: BC Managed Care – PPO | Source: Ambulatory Visit | Attending: Urology | Admitting: Urology

## 2023-05-27 DIAGNOSIS — N132 Hydronephrosis with renal and ureteral calculous obstruction: Secondary | ICD-10-CM | POA: Diagnosis not present

## 2023-05-27 DIAGNOSIS — Z466 Encounter for fitting and adjustment of urinary device: Secondary | ICD-10-CM | POA: Insufficient documentation

## 2023-05-27 DIAGNOSIS — N201 Calculus of ureter: Secondary | ICD-10-CM | POA: Insufficient documentation

## 2023-06-01 ENCOUNTER — Encounter: Payer: Self-pay | Admitting: Urology

## 2023-06-01 ENCOUNTER — Ambulatory Visit: Payer: BC Managed Care – PPO | Admitting: Urology

## 2023-06-01 ENCOUNTER — Other Ambulatory Visit: Payer: Self-pay | Admitting: Family

## 2023-06-01 DIAGNOSIS — N1330 Unspecified hydronephrosis: Secondary | ICD-10-CM

## 2023-06-01 DIAGNOSIS — G43809 Other migraine, not intractable, without status migrainosus: Secondary | ICD-10-CM

## 2023-06-01 DIAGNOSIS — R1031 Right lower quadrant pain: Secondary | ICD-10-CM

## 2023-06-01 DIAGNOSIS — R9389 Abnormal findings on diagnostic imaging of other specified body structures: Secondary | ICD-10-CM

## 2023-06-01 NOTE — Telephone Encounter (Signed)
Ok to refill for 90 day?

## 2023-06-07 ENCOUNTER — Other Ambulatory Visit: Payer: Self-pay | Admitting: Urology

## 2023-06-07 DIAGNOSIS — G4452 New daily persistent headache (NDPH): Secondary | ICD-10-CM

## 2023-06-20 ENCOUNTER — Encounter: Payer: Self-pay | Admitting: Internal Medicine

## 2023-06-20 NOTE — Telephone Encounter (Signed)
Patient was seen on 05/12/2023. Patient also had labs done at that visit. Please advise.

## 2023-06-21 ENCOUNTER — Ambulatory Visit
Admission: RE | Admit: 2023-06-21 | Discharge: 2023-06-21 | Disposition: A | Discharge status: Home / Self Care | Payer: BC Managed Care – PPO | Source: Ambulatory Visit | Attending: Urology | Admitting: Urology

## 2023-06-21 DIAGNOSIS — R1031 Right lower quadrant pain: Secondary | ICD-10-CM | POA: Diagnosis not present

## 2023-06-21 DIAGNOSIS — N1330 Unspecified hydronephrosis: Secondary | ICD-10-CM | POA: Diagnosis not present

## 2023-06-21 DIAGNOSIS — R109 Unspecified abdominal pain: Secondary | ICD-10-CM | POA: Diagnosis not present

## 2023-06-21 DIAGNOSIS — K439 Ventral hernia without obstruction or gangrene: Secondary | ICD-10-CM | POA: Diagnosis not present

## 2023-06-21 DIAGNOSIS — R9389 Abnormal findings on diagnostic imaging of other specified body structures: Secondary | ICD-10-CM

## 2023-06-21 DIAGNOSIS — N200 Calculus of kidney: Secondary | ICD-10-CM | POA: Diagnosis not present

## 2023-06-29 ENCOUNTER — Ambulatory Visit (INDEPENDENT_AMBULATORY_CARE_PROVIDER_SITE_OTHER): Payer: BC Managed Care – PPO | Admitting: Urology

## 2023-06-29 VITALS — BP 117/78 | HR 68 | Ht 63.5 in | Wt 142.0 lb

## 2023-06-29 DIAGNOSIS — K429 Umbilical hernia without obstruction or gangrene: Secondary | ICD-10-CM

## 2023-06-29 DIAGNOSIS — N3281 Overactive bladder: Secondary | ICD-10-CM | POA: Diagnosis not present

## 2023-06-29 DIAGNOSIS — N200 Calculus of kidney: Secondary | ICD-10-CM

## 2023-06-29 DIAGNOSIS — N179 Acute kidney failure, unspecified: Secondary | ICD-10-CM | POA: Diagnosis not present

## 2023-06-29 DIAGNOSIS — N261 Atrophy of kidney (terminal): Secondary | ICD-10-CM | POA: Diagnosis not present

## 2023-06-29 MED ORDER — MIRABEGRON ER 50 MG PO TB24
50.0000 mg | ORAL_TABLET | Freq: Every day | ORAL | 11 refills | Status: DC
Start: 2023-06-29 — End: 2023-07-05

## 2023-06-29 NOTE — Progress Notes (Signed)
Marcelle Overlie Plume,acting as a scribe for Vanna Scotland, MD.,have documented all relevant documentation on the behalf of Vanna Scotland, MD,as directed by  Vanna Scotland, MD while in the presence of Vanna Scotland, MD.  06/29/2023 2:53 PM   Jordan Zimmerman December 07, 1970 [id number]  Referring provider: Mort Sawyers, FNP [address] [place],  [place] [address]  Chief Complaint  Patient presents with   Follow-up    HPI: 52 year-old female who returns today for follow up for stones.   She was taken to the operating room on 03/28/2023 for left ureteroscopy and laser lithotripsy for a 1 cm high-grade ureteral obstruction with severe hydronephrosis. Her stent was removed in the office on 04/06/2023. Following the stent removal, she returned to the office on 04/27/2023 with complaints of right sided flank pain and concerns for possible cystitis versus stent irritation. Her urine culture was negative.   She followed up with a renal ultrasound on 05/27/2023 as per normal protocol following ureteroscopy. This showed actually mild hydronephrosis bilaterally, mild on the right and moderate on the left. That being said, her pre-void bladder was over 700 cc, which is relatively full. There was no comment on whether the hydronephrosis resolved with voiding due to concern for both ureteral stricture and the setting of a high grade obstruction on the left as well as right flank pain.   A repeat CT scan was completed on 06/21/2023 and I personally reviewed it today. She does have some debris in her left lower pole. The hydronephrosis on this side is completely resolved and she has no further ureteral fragments. Her 6 mm right non-obstructing stone is in unchanged position and there is no hydronephrosis on this side as well.   Today, she reports feeling "miserable" with persistent urgency and frequency.  She had these same urinary symptoms preop notibly.   She denies any burning or hematuria. She notes  continued right flank pain. She also reports an umbilical hernia that provides daily discomfort and would like a referral for treatment.   PMH: Past Medical History:  Diagnosis Date   Anxiety    Chronic fatigue    Fibromyalgia    History of kidney stones    IBS (irritable bowel syndrome)    Kidney stones    Migraines     Surgical History: Past Surgical History:  Procedure Laterality Date   CHOLECYSTECTOMY  2001   COLONOSCOPY N/A 07/21/2021   Procedure: COLONOSCOPY;  Surgeon: [name], MD;  Location: [facility] ENDOSCOPY;  Service: Gastroenterology;  Laterality: N/A;   CYSTOSCOPY W/ URETEROSCOPY  ~ 2008   "pulled stone rest of the way out" (05/21/2013)   CYSTOSCOPY/URETEROSCOPY/HOLMIUM LASER/STENT PLACEMENT Left 03/28/2023   Procedure: CYSTOSCOPY/URETEROSCOPY/HOLMIUM LASER/STENT PLACEMENT;  Surgeon: Vanna Scotland, MD;  Location: [facility] ORS;  Service: Urology;  Laterality: Left;   EXTRACORPOREAL SHOCK WAVE LITHOTRIPSY Right 10/06/2017   Procedure: RIGHT EXTRACORPOREAL SHOCK WAVE LITHOTRIPSY (ESWL);  Surgeon: Bjorn Pippin, MD;  Location: [facility] ORS;  Service: Urology;  Laterality: Right;   FOOT FRACTURE SURGERY Bilateral    TONSILLECTOMY  1992    Home Medications:  Allergies as of 06/29/2023       Reactions   Ambien [zolpidem Tartrate] Anaphylaxis   Amoxicillin Hives, Swelling   Fever.    Penicillins Hives, Other (See Comments)   High fever        Medication List        Accurate as of June 29, 2023  2:53 PM. If you have any questions, ask  your nurse or doctor.          STOP taking these medications    ondansetron 4 MG disintegrating tablet Commonly known as: Zofran ODT       TAKE these medications    acetaminophen 500 MG tablet Commonly known as: TYLENOL Take 1,000 mg by mouth every 6 (six) hours as needed.   mirabegron ER 50 MG Tb24 tablet Commonly known as: MYRBETRIQ Take 1 tablet (50 mg total) by mouth daily.   propranolol ER 60 MG 24 hr capsule Commonly  known as: INDERAL LA TAKE 1 CAPSULE BY MOUTH EVERY DAY   Qulipta 30 MG Tabs Generic drug: Atogepant TAKE 1 TABLET BY MOUTH EVERY DAY        Allergies:  Allergies  Allergen Reactions   Ambien [Zolpidem Tartrate] Anaphylaxis   Amoxicillin Hives and Swelling    Fever.    Penicillins Hives and Other (See Comments)    High fever    Family History: Family History  Problem Relation Age of Onset   Diabetes Mother    Hypertension Father    Diabetes Father    Prostate cancer Father    Other Brother        Disk Disease   Breast cancer Maternal Grandmother    Breast cancer Maternal Aunt 29    Social History:  reports that she quit smoking about 28 years ago. Her smoking use included cigarettes. She started smoking about 32 years ago. She has a 4 pack-year smoking history. She has been exposed to tobacco smoke. She has never used smokeless tobacco. She reports current drug use. Drug: Marijuana. She reports that she does not drink alcohol.   Physical Exam: BP 117/78   Pulse 68   Ht 5' 3.5" (1.613 m)   Wt 142 lb (64.4 kg)   BMI 24.76 kg/m   Constitutional:  Alert and oriented, No acute distress. HEENT: [place] AT, moist mucus membranes.  Trachea midline, no masses. Neurologic: Grossly intact, no focal deficits, moving all 4 extremities. Psychiatric: Normal mood and affect.   Pertinent Imaging:  EXAM: RENAL / URINARY TRACT ULTRASOUND COMPLETE   COMPARISON:  01/26/2023.   FINDINGS: The right kidney measured 9.9 cm and the left kidney measured 9.3 cm. The kidneys demonstrate normal echogenicity. No renal parenchymal lesions are identified. Possible echogenic stone on the right that measures at least 6 mm. Mild hydronephrosis on the right and moderate hydronephrosis on the left.   The urinary bladder appeared unremarkable. Bladder prevoid volume was 700 mL. The bladder emptied nearly completely.   IMPRESSION: 1. Right kidney stone. 2. Hydronephrosis bilaterally, mild on  the right and moderate on the left.     Electronically Signed   By: Layla Maw M.D.   On: 05/29/2023 21:59  This was personally reviewed and I agree with the radiologic interpretation.  CT RENAL STONE STUDY  Narrative CLINICAL DATA:  Lithotripsy for renal stones early 2024. Reduced kidney function persistent left flank pain.  EXAM: CT ABDOMEN AND PELVIS WITHOUT CONTRAST  TECHNIQUE: Multidetector CT imaging of the abdomen and pelvis was performed following the standard protocol without IV contrast.  RADIATION DOSE REDUCTION: This exam was performed according to the departmental dose-optimization program which includes automated exposure control, adjustment of the mA and/or kV according to patient size and/or use of iterative reconstruction technique.  COMPARISON:  CT March 23, 2023  FINDINGS: Lower chest: No acute abnormality.  Hepatobiliary: Unremarkable noncontrast enhanced appearance of the hepatic parenchyma. Gallbladder surgically absent. Similar prominence  of the biliary tree favored reservoir effect post cholecystectomy  Pancreas: No pancreatic ductal dilation or evidence of acute inflammation.  Spleen: No splenomegaly.  Adrenals/Urinary Tract: Bilateral adrenal glands appear normal.  No hydronephrosis. Left renal atrophy with cortical renal scarring. Nonobstructive bilateral renal stones measure up to 6 mm bilaterally. No obstructive ureteral or bladder calculi.  Urinary bladder is unremarkable for degree of distension  Stomach/Bowel: No radiopaque enteric contrast material was administered. Stomach is unremarkable for degree of distension. No pathologic dilation of large or small bowel. No evidence of acute bowel inflammation  Vascular/Lymphatic: Normal caliber abdominal aorta. Smooth IVC contours. No pathologically enlarged abdominal or pelvic lymph nodes  Reproductive: Intrauterine device in place  Other: Small fat containing supraumbilical  ventral hernia.  Musculoskeletal: L5-S1 discogenic disease  IMPRESSION: 1. Nonobstructive bilateral renal stones measure up to 6 mm bilaterally. No obstructive ureteral or bladder calculi. 2. Left renal atrophy with cortical renal scarring. 3. Small fat containing supraumbilical ventral hernia.   Electronically Signed By: Maudry Mayhew M.D. On: 06/26/2023 09:25  This was personally reviewed and I agree with the radiologic interpretation.  Assessment & Plan:    1. Bilateral nephrolithiasis - Both of her stones are non-obstructing and she has no further hydronephrosis, which is all quite reassuring - Stone composition is unknown - Recommend clinical observation - Right sided stone is not obstructing and in an unchanged position  2. Umbilical hernia - Referral to general surgery - She reports that she is symptomatic from this - Incidental find on scan  3. OAB - Symptoms of urgency and frequency - Myrbetriq 50 mg - If cost-prohibitive, check insurance formulary for tier one overactive bladder medications.  4. Back pain - Unrelated to stones now that she does not have further obstructions - Recommend seeking alternative explanations such as musculoskeletal  5. Left renal atrophy - Likely due to prolonged obstruction - Right kidney compensating - Check kidney function labs today with BMP  Return in about 3 months (around 09/29/2023) for assessment of medication efficacy with Sam or Carollee Herter.  I have reviewed the above documentation for accuracy and completeness, and I agree with the above.   Vanna Scotland, MD    [facility] [address], [address] [place], [place] [address] [phone]

## 2023-07-04 ENCOUNTER — Ambulatory Visit (INDEPENDENT_AMBULATORY_CARE_PROVIDER_SITE_OTHER): Payer: BC Managed Care – PPO | Admitting: Family Medicine

## 2023-07-04 ENCOUNTER — Encounter: Payer: Self-pay | Admitting: Family Medicine

## 2023-07-04 VITALS — BP 112/72 | HR 84 | Ht 63.0 in | Wt 130.5 lb

## 2023-07-04 DIAGNOSIS — G43119 Migraine with aura, intractable, without status migrainosus: Secondary | ICD-10-CM | POA: Diagnosis not present

## 2023-07-04 MED ORDER — RIZATRIPTAN BENZOATE 10 MG PO TBDP: 10.0000 mg | ORAL_TABLET | ORAL | 11 refills | Status: DC | PRN

## 2023-07-04 MED ORDER — PREDNISONE 10 MG (21) PO TBPK: ORAL_TABLET | ORAL | 0 refills | Status: DC

## 2023-07-04 MED ORDER — AJOVY 225 MG/1.5ML ~~LOC~~ SOAJ: 225.0000 mg | SUBCUTANEOUS | 3 refills | Status: DC

## 2023-07-04 MED ORDER — ONDANSETRON HCL 4 MG PO TABS: 4.0000 mg | ORAL_TABLET | Freq: Three times a day (TID) | ORAL | 0 refills | Status: DC | PRN

## 2023-07-04 NOTE — H&P (View-Only) (Signed)
Patient ID: Jordan Zimmerman, female   DOB: 01-04-1971, 52 y.o.   MRN: [id number]  Chief Complaint: Incisional hernia  History of Present Illness Jordan Zimmerman is a 52 y.o. female with painful area in the epigastrium, present for 6 months.  Reports pain with associated nausea, nearly constant.  She reports bulge.  The bulge never completely goes away.  Had been utilizing a PPI for presumed ulcer, this was not tolerated secondary to the nausea that is exacerbated.  Had CT scan imaging.  Past Medical History Past Medical History:  Diagnosis Date   Anxiety    Chronic fatigue    Fibromyalgia    History of kidney stones    IBS (irritable bowel syndrome)    Kidney stones    Migraines       Past Surgical History:  Procedure Laterality Date   CHOLECYSTECTOMY  2001   COLONOSCOPY N/A 07/21/2021   Procedure: COLONOSCOPY;  Surgeon: [name], MD;  Location: [facility] ENDOSCOPY;  Service: Gastroenterology;  Laterality: N/A;   CYSTOSCOPY W/ URETEROSCOPY  ~ 2008   "pulled stone rest of the way out" (05/21/2013)   CYSTOSCOPY/URETEROSCOPY/HOLMIUM LASER/STENT PLACEMENT Left 03/28/2023   Procedure: CYSTOSCOPY/URETEROSCOPY/HOLMIUM LASER/STENT PLACEMENT;  Surgeon: Vanna Scotland, MD;  Location: [facility] ORS;  Service: Urology;  Laterality: Left;   EXTRACORPOREAL SHOCK WAVE LITHOTRIPSY Right 10/06/2017   Procedure: RIGHT EXTRACORPOREAL SHOCK WAVE LITHOTRIPSY (ESWL);  Surgeon: Bjorn Pippin, MD;  Location: [facility] ORS;  Service: Urology;  Laterality: Right;   FOOT FRACTURE SURGERY Bilateral    TONSILLECTOMY  1992    Allergies  Allergen Reactions   Ambien [Zolpidem Tartrate] Anaphylaxis   Amoxicillin Hives and Swelling    Fever.    Penicillins Hives and Other (See Comments)    High fever    Current Outpatient Medications  Medication Sig Dispense Refill   acetaminophen (TYLENOL) 500 MG tablet Take 1,000 mg by mouth every 6 (six) hours as needed.     Fremanezumab-vfrm (AJOVY) 225 MG/1.5ML SOAJ Inject 225 mg into the skin  every 30 (thirty) days. 4.5 mL 3   levonorgestrel (MIRENA) 20 MCG/DAY IUD 1 each by Intrauterine route once.     ondansetron (ZOFRAN) 4 MG tablet Take 1 tablet (4 mg total) by mouth every 8 (eight) hours as needed for nausea or vomiting. 20 tablet 0   predniSONE (STERAPRED UNI-PAK 21 TAB) 10 MG (21) TBPK tablet Taper pack as directed 1 each 0   propranolol ER (INDERAL LA) 60 MG 24 hr capsule TAKE 1 CAPSULE BY MOUTH EVERY DAY 90 capsule 2   rizatriptan (MAXALT-MLT) 10 MG disintegrating tablet Take 1 tablet (10 mg total) by mouth as needed for migraine. May repeat in 2 hours if needed 9 tablet 11   No current facility-administered medications for this visit.    Family History Family History  Problem Relation Age of Onset   Diabetes Mother    Hypertension Father    Diabetes Father    Prostate cancer Father    Other Brother        Disk Disease   Breast cancer Maternal Grandmother    Breast cancer Maternal Aunt 14      Social History Social History   Tobacco Use   Smoking status: Former    Current packs/day: 0.00    Average packs/day: 1 pack/day for 4.0 years (4.0 ttl pk-yrs)    Types: Cigarettes    Start date: 11/29/1990    Quit date: 11/29/1994    Years since quitting: 28.6    Passive  exposure: Past   Smokeless tobacco: Never  Vaping Use   Vaping status: Never Used  Substance Use Topics   Alcohol use: No    Comment: occas   Drug use: Yes    Types: Marijuana    Comment: <1 week         Review of Systems  Constitutional:  Positive for fever and malaise/fatigue.  HENT:  Positive for hearing loss and tinnitus.   Eyes: Negative.   Respiratory: Negative.    Cardiovascular: Negative.   Gastrointestinal:  Positive for abdominal pain, constipation, nausea and vomiting.  Genitourinary: Negative.   Skin: Negative.   Neurological:  Positive for headaches.  Psychiatric/Behavioral: Negative.       Physical Exam Blood pressure 110/76, pulse 75, temperature 98.4 F (36.9 C),  height 5\' 5"  (1.651 m), weight 128 lb (58.1 kg), SpO2 97%. Last Weight  Most recent update: 07/05/2023  3:25 PM    Weight  58.1 kg (128 lb)             CONSTITUTIONAL: Well developed, and nourished, appropriately responsive and aware without distress.   EYES: Sclera non-icteric.   EARS, NOSE, MOUTH AND THROAT: The oropharynx is clear. Oral mucosa is pink and moist.  Hearing is intact to voice.  NECK: Trachea is midline, and there is no jugular venous distension.  LYMPH NODES:  Lymph nodes in the neck are not appreciated. RESPIRATORY:  Lungs are clear, and breath sounds are equal bilaterally.  Normal respiratory effort without pathologic use of accessory muscles. CARDIOVASCULAR: Heart is regular in rate and rhythm.   Well perfused.  GI: The abdomen is notable for a vague epigastric soft tissue irregularity, without appreciable underlying fascial defect consistent with possible chronic incarceration.  It is tender on examination.  Otherwise soft, nontender, and nondistended. There were no palpable masses.  I did not appreciate hepatosplenomegaly.  MUSCULOSKELETAL:  Symmetrical muscle tone appreciated in all four extremities.    SKIN: Skin turgor is normal. No pathologic skin lesions appreciated.  NEUROLOGIC:  Motor and sensation appear grossly normal.  Cranial nerves are grossly without defect. PSYCH:  Alert and oriented to person, place and time. Affect is appropriate for situation.  Data Reviewed I have personally reviewed what is currently available of the patient's imaging, recent labs and medical records.   Labs:     Latest Ref Rng & Units 01/28/2023   11:46 AM 01/03/2023   12:04 PM 03/09/2021   11:37 AM  CBC  WBC 4.0 - 10.5 K/uL 11.1  9.8  8.9   Hemoglobin 12.0 - 15.0 g/dL 16.1  09.6  04.5   Hematocrit 36.0 - 46.0 % 40.9  38.8  39.5   Platelets 150.0 - 400.0 K/uL 393.0  420.0  410.0       Latest Ref Rng & Units 06/29/2023    2:53 PM 01/19/2023    3:57 PM 01/03/2023   12:04 PM  CMP   Glucose 70 - 99 mg/dL 84  409  811   BUN 6 - 24 mg/dL 14  16  16    Creatinine 0.57 - 1.00 mg/dL 9.14  7.82  9.56   Sodium 134 - 144 mmol/L 141  141  139   Potassium 3.5 - 5.2 mmol/L 4.6  4.2  4.5   Chloride 96 - 106 mmol/L 104  106  104   CO2 20 - 29 mmol/L 23  28  26    Calcium 8.7 - 10.2 mg/dL 21.3  08.6  9.8  Imaging: Radiological images reviewed:  CLINICAL DATA:  Lithotripsy for renal stones early 2024. Reduced kidney function persistent left flank pain.   EXAM: CT ABDOMEN AND PELVIS WITHOUT CONTRAST   TECHNIQUE: Multidetector CT imaging of the abdomen and pelvis was performed following the standard protocol without IV contrast.   RADIATION DOSE REDUCTION: This exam was performed according to the departmental dose-optimization program which includes automated exposure control, adjustment of the mA and/or kV according to patient size and/or use of iterative reconstruction technique.   COMPARISON:  CT March 23, 2023   FINDINGS: Lower chest: No acute abnormality.   Hepatobiliary: Unremarkable noncontrast enhanced appearance of the hepatic parenchyma. Gallbladder surgically absent. Similar prominence of the biliary tree favored reservoir effect post cholecystectomy   Pancreas: No pancreatic ductal dilation or evidence of acute inflammation.   Spleen: No splenomegaly.   Adrenals/Urinary Tract: Bilateral adrenal glands appear normal.   No hydronephrosis. Left renal atrophy with cortical renal scarring. Nonobstructive bilateral renal stones measure up to 6 mm bilaterally. No obstructive ureteral or bladder calculi.   Urinary bladder is unremarkable for degree of distension   Stomach/Bowel: No radiopaque enteric contrast material was administered. Stomach is unremarkable for degree of distension. No pathologic dilation of large or small bowel. No evidence of acute bowel inflammation   Vascular/Lymphatic: Normal caliber abdominal aorta. Smooth IVC contours. No  pathologically enlarged abdominal or pelvic lymph nodes   Reproductive: Intrauterine device in place   Other: Small fat containing supraumbilical ventral hernia.   Musculoskeletal: L5-S1 discogenic disease   IMPRESSION: 1. Nonobstructive bilateral renal stones measure up to 6 mm bilaterally. No obstructive ureteral or bladder calculi. 2. Left renal atrophy with cortical renal scarring. 3. Small fat containing supraumbilical ventral hernia.     Electronically Signed   By: Maudry Mayhew M.D.   On: 06/26/2023 09:25   Within last 24 hrs: No results found.  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  I discussed possibility of incarceration, strangulation, enlargement in size over time, and the need for emergency surgery in the face of these.  Also reviewed the techniques of reduction should incarceration occur, and when unsuccessful to present to the ED.  Also discussed that surgery risks include recurrence which can be up to 30% in the case of complex hernias, use of prosthetic materials (mesh) and the increased risk of infection and the possible need for re-operation and removal  of mesh, possibility of post-op SBO or ileus, and the risks of general anesthetic including heart attack, stroke, sudden death or some reaction to anesthetic medications. The patient, and those present, appear to understand the risks, any and all questions were answered to the patient's satisfaction.  No guarantees  were ever expressed or implied.  Face-to-face time spent with the patient and accompanying care providers(if present) was 30 minutes, with more than 50% of the time spent counseling, educating, and coordinating care of the patient.    These notes generated with voice recognition software. I apologize for typographical errors.  Campbell Lerner M.D., FACS 07/05/2023, 4:06 PM

## 2023-07-04 NOTE — Progress Notes (Unsigned)
Patient ID: Jordan Zimmerman, female   DOB: 12-24-1970, 52 y.o.   MRN: [id number]  Chief Complaint: Incisional hernia  History of Present Illness Shevaun Bachman is a 52 y.o. female with painful area in the epigastrium, present for 6 months.  Reports pain with associated nausea, nearly constant.  She reports bulge.  The bulge never completely goes away.  Had been utilizing a PPI for presumed ulcer, this was not tolerated secondary to the nausea that is exacerbated.  Had CT scan imaging.  Past Medical History Past Medical History:  Diagnosis Date   Anxiety    Chronic fatigue    Fibromyalgia    History of kidney stones    IBS (irritable bowel syndrome)    Kidney stones    Migraines       Past Surgical History:  Procedure Laterality Date   CHOLECYSTECTOMY  2001   COLONOSCOPY N/A 07/21/2021   Procedure: COLONOSCOPY;  Surgeon: [name], MD;  Location: [facility] ENDOSCOPY;  Service: Gastroenterology;  Laterality: N/A;   CYSTOSCOPY W/ URETEROSCOPY  ~ 2008   "pulled stone rest of the way out" (05/21/2013)   CYSTOSCOPY/URETEROSCOPY/HOLMIUM LASER/STENT PLACEMENT Left 03/28/2023   Procedure: CYSTOSCOPY/URETEROSCOPY/HOLMIUM LASER/STENT PLACEMENT;  Surgeon: Vanna Scotland, MD;  Location: [facility] ORS;  Service: Urology;  Laterality: Left;   EXTRACORPOREAL SHOCK WAVE LITHOTRIPSY Right 10/06/2017   Procedure: RIGHT EXTRACORPOREAL SHOCK WAVE LITHOTRIPSY (ESWL);  Surgeon: Bjorn Pippin, MD;  Location: [facility] ORS;  Service: Urology;  Laterality: Right;   FOOT FRACTURE SURGERY Bilateral    TONSILLECTOMY  1992    Allergies  Allergen Reactions   Ambien [Zolpidem Tartrate] Anaphylaxis   Amoxicillin Hives and Swelling    Fever.    Penicillins Hives and Other (See Comments)    High fever    Current Outpatient Medications  Medication Sig Dispense Refill   acetaminophen (TYLENOL) 500 MG tablet Take 1,000 mg by mouth every 6 (six) hours as needed.     Fremanezumab-vfrm (AJOVY) 225 MG/1.5ML SOAJ Inject 225 mg into the skin  every 30 (thirty) days. 4.5 mL 3   levonorgestrel (MIRENA) 20 MCG/DAY IUD 1 each by Intrauterine route once.     ondansetron (ZOFRAN) 4 MG tablet Take 1 tablet (4 mg total) by mouth every 8 (eight) hours as needed for nausea or vomiting. 20 tablet 0   predniSONE (STERAPRED UNI-PAK 21 TAB) 10 MG (21) TBPK tablet Taper pack as directed 1 each 0   propranolol ER (INDERAL LA) 60 MG 24 hr capsule TAKE 1 CAPSULE BY MOUTH EVERY DAY 90 capsule 2   rizatriptan (MAXALT-MLT) 10 MG disintegrating tablet Take 1 tablet (10 mg total) by mouth as needed for migraine. May repeat in 2 hours if needed 9 tablet 11   No current facility-administered medications for this visit.    Family History Family History  Problem Relation Age of Onset   Diabetes Mother    Hypertension Father    Diabetes Father    Prostate cancer Father    Other Brother        Disk Disease   Breast cancer Maternal Grandmother    Breast cancer Maternal Aunt 82      Social History Social History   Tobacco Use   Smoking status: Former    Current packs/day: 0.00    Average packs/day: 1 pack/day for 4.0 years (4.0 ttl pk-yrs)    Types: Cigarettes    Start date: 11/29/1990    Quit date: 11/29/1994    Years since quitting: 28.6    Passive  exposure: Past   Smokeless tobacco: Never  Vaping Use   Vaping status: Never Used  Substance Use Topics   Alcohol use: No    Comment: occas   Drug use: Yes    Types: Marijuana    Comment: <1 week         Review of Systems  Constitutional:  Positive for fever and malaise/fatigue.  HENT:  Positive for hearing loss and tinnitus.   Eyes: Negative.   Respiratory: Negative.    Cardiovascular: Negative.   Gastrointestinal:  Positive for abdominal pain, constipation, nausea and vomiting.  Genitourinary: Negative.   Skin: Negative.   Neurological:  Positive for headaches.  Psychiatric/Behavioral: Negative.       Physical Exam Blood pressure 110/76, pulse 75, temperature 98.4 F (36.9 C),  height 5\' 5"  (1.651 m), weight 128 lb (58.1 kg), SpO2 97%. Last Weight  Most recent update: 07/05/2023  3:25 PM    Weight  58.1 kg (128 lb)             CONSTITUTIONAL: Well developed, and nourished, appropriately responsive and aware without distress. ***  EYES: Sclera non-icteric.   EARS, NOSE, MOUTH AND THROAT: Mask worn.  *** The oropharynx is clear. Oral mucosa is pink and moist.  Dentition: ***   Hearing is intact to voice.  NECK: Trachea is midline, and there is no jugular venous distension.  LYMPH NODES:  Lymph nodes in the neck are not appreciated. RESPIRATORY:  Lungs are clear, and breath sounds are equal bilaterally. *** Normal respiratory effort without pathologic use of accessory muscles. CARDIOVASCULAR: Heart is regular in rate and rhythm.  *** Well perfused.  GI: The abdomen is *** soft, nontender, and nondistended. There were no palpable masses. *** I did not appreciate hepatosplenomegaly. There were normal bowel sounds.  *** GU: *** MUSCULOSKELETAL:  Symmetrical muscle tone appreciated in all four extremities.    SKIN: Skin turgor is normal. No pathologic skin lesions appreciated.  NEUROLOGIC:  Motor and sensation appear grossly normal.  Cranial nerves are grossly without defect. PSYCH:  Alert and oriented to person, place and time. Affect is appropriate for situation.  Data Reviewed I have personally reviewed what is currently available of the patient's imaging, recent labs and medical records.   Labs:     Latest Ref Rng & Units 01/28/2023   11:46 AM 01/03/2023   12:04 PM 03/09/2021   11:37 AM  CBC  WBC 4.0 - 10.5 K/uL 11.1  9.8  8.9   Hemoglobin 12.0 - 15.0 g/dL 21.3  08.6  57.8   Hematocrit 36.0 - 46.0 % 40.9  38.8  39.5   Platelets 150.0 - 400.0 K/uL 393.0  420.0  410.0       Latest Ref Rng & Units 06/29/2023    2:53 PM 01/19/2023    3:57 PM 01/03/2023   12:04 PM  CMP  Glucose 70 - 99 mg/dL 84  469  629   BUN 6 - 24 mg/dL 14  16  16    Creatinine 0.57 - 1.00  mg/dL 5.28  4.13  2.44   Sodium 134 - 144 mmol/L 141  141  139   Potassium 3.5 - 5.2 mmol/L 4.6  4.2  4.5   Chloride 96 - 106 mmol/L 104  106  104   CO2 20 - 29 mmol/L 23  28  26    Calcium 8.7 - 10.2 mg/dL 01.0  27.2  9.8    *** {Labs :18171}  Imaging: Radiological images reviewed:  CLINICAL DATA:  Lithotripsy for renal stones early 2024. Reduced kidney function persistent left flank pain.   EXAM: CT ABDOMEN AND PELVIS WITHOUT CONTRAST   TECHNIQUE: Multidetector CT imaging of the abdomen and pelvis was performed following the standard protocol without IV contrast.   RADIATION DOSE REDUCTION: This exam was performed according to the departmental dose-optimization program which includes automated exposure control, adjustment of the mA and/or kV according to patient size and/or use of iterative reconstruction technique.   COMPARISON:  CT March 23, 2023   FINDINGS: Lower chest: No acute abnormality.   Hepatobiliary: Unremarkable noncontrast enhanced appearance of the hepatic parenchyma. Gallbladder surgically absent. Similar prominence of the biliary tree favored reservoir effect post cholecystectomy   Pancreas: No pancreatic ductal dilation or evidence of acute inflammation.   Spleen: No splenomegaly.   Adrenals/Urinary Tract: Bilateral adrenal glands appear normal.   No hydronephrosis. Left renal atrophy with cortical renal scarring. Nonobstructive bilateral renal stones measure up to 6 mm bilaterally. No obstructive ureteral or bladder calculi.   Urinary bladder is unremarkable for degree of distension   Stomach/Bowel: No radiopaque enteric contrast material was administered. Stomach is unremarkable for degree of distension. No pathologic dilation of large or small bowel. No evidence of acute bowel inflammation   Vascular/Lymphatic: Normal caliber abdominal aorta. Smooth IVC contours. No pathologically enlarged abdominal or pelvic lymph nodes   Reproductive:  Intrauterine device in place   Other: Small fat containing supraumbilical ventral hernia.   Musculoskeletal: L5-S1 discogenic disease   IMPRESSION: 1. Nonobstructive bilateral renal stones measure up to 6 mm bilaterally. No obstructive ureteral or bladder calculi. 2. Left renal atrophy with cortical renal scarring. 3. Small fat containing supraumbilical ventral hernia.     Electronically Signed   By: Maudry Mayhew M.D.   On: 06/26/2023 09:25   Within last 24 hrs: No results found.  Assessment    *** Patient Active Problem List   Diagnosis Date Noted   Polyarthralgia 02/03/2023   New daily persistent headache 02/03/2023   Elevated sed rate 01/31/2023   Endometriosis 01/17/2023   Body mass index (BMI) of 23.0 to 23.9 in adult 01/03/2023   Syncope 12/29/2021   Elevated blood ketone body level 06/22/2021   Migraines 03/09/2021   Night terrors, adult 03/09/2021   Osteopenia of lumbar spine 08/17/2018   Chronic midline low back pain with bilateral sciatica 06/19/2018   Kidney stone 06/19/2018   Depression with anxiety 06/19/2018   Bilateral ankle pain 01/26/2018   PTSD (post-traumatic stress disorder) 11/09/2017   Fibromyalgia 05/21/2013   Irritable bowel syndrome 05/21/2013    Plan    ***  * Cannot find OR case * *** Face-to-face time spent with the patient and accompanying care providers(if present) was *** minutes, with more than 50% of the time spent counseling, educating, and coordinating care of the patient.    These notes generated with voice recognition software. I apologize for typographical errors.  Campbell Lerner M.D., FACS 07/05/2023, 4:06 PM

## 2023-07-04 NOTE — Patient Instructions (Signed)
Below is our plan:  We will continue propranolol 60mg  daily. We will add Ajovy injections every 30 days and discontinue Qulipta. We will start a prednisone taper. Take as directed. We will try rizatriptan as needed for abortive therapy. Please take 1 tablet at onset of headache. May take 1 additional tablet in 2 hours if needed. Do not take more than 2 tablets in 24 hours or more than 10 in a month. We will also give you a few ondansetron to use sparingly for nausea.   Please make sure you are staying well hydrated. I recommend 50-60 ounces daily. Well balanced diet and regular exercise encouraged. Consistent sleep schedule with 6-8 hours recommended.   Please continue follow up with care team as directed.   Follow up with me in 6 months   You may receive a survey regarding today's visit. I encourage you to leave honest feed back as I do use this information to improve patient care. Thank you for seeing me today!   GENERAL HEADACHE INFORMATION:   Natural supplements: Magnesium Oxide or Magnesium Glycinate 500 mg at bed (up to 800 mg daily) Coenzyme Q10 300 mg in AM Vitamin B2- 200 mg twice a day   Add 1 supplement at a time since even natural supplements can have undesirable side effects. You can sometimes buy supplements cheaper (especially Coenzyme Q10) at www.WebmailGuide.co.za or at [facility].  Migraine with aura: There is increased risk for stroke in women with migraine with aura and a contraindication for the combined contraceptive pill for use by women who have migraine with aura. The risk for women with migraine without aura is lower. However other risk factors like smoking are far more likely to increase stroke risk than migraine. There is a recommendation for no smoking and for the use of OCPs without estrogen such as progestogen only pills particularly for women with migraine with aura.Marland Kitchen People who have migraine headaches with auras may be 3 times more likely to have a stroke caused by a blood  clot, compared to migraine patients who don't see auras. Women who take hormone-replacement therapy may be 30 percent more likely to suffer a clot-based stroke than women not taking medication containing estrogen. Other risk factors like smoking and high blood pressure may be  much more important.    Vitamins and herbs that show potential:   Magnesium: Magnesium (250 mg twice a day or 500 mg at bed) has a relaxant effect on smooth muscles such as blood vessels. Individuals suffering from frequent or daily headache usually have low magnesium levels which can be increase with daily supplementation of 400-750 mg. Three trials found 40-90% average headache reduction  when used as a preventative. Magnesium may help with headaches are aura, the best evidence for magnesium is for migraine with aura is its thought to stop the cortical spreading depression we believe is the pathophysiology of migraine aura.Magnesium also demonstrated the benefit in menstrually related migraine.  Magnesium is part of the messenger system in the serotonin cascade and it is a good muscle relaxant.  It is also useful for constipation which can be a side effect of other medications used to treat migraine. Good sources include nuts, whole grains, and tomatoes. Side Effects: loose stool/diarrhea  Riboflavin (vitamin B 2) 200 mg twice a day. This vitamin assists nerve cells in the production of ATP a principal energy storing molecule.  It is necessary for many chemical reactions in the body.  There have been at least 3 clinical trials  of riboflavin using 400 mg per day all of which suggested that migraine frequency can be decreased.  All 3 trials showed significant improvement in over half of migraine sufferers.  The supplement is found in bread, cereal, milk, meat, and poultry.  Most Americans get more riboflavin than the recommended daily allowance, however riboflavin deficiency is not necessary for the supplements to help prevent headache.  Side effects: energizing, green urine   Coenzyme Q10: This is present in almost all cells in the body and is critical component for the conversion of energy.  Recent studies have shown that a nutritional supplement of CoQ10 can reduce the frequency of migraine attacks by improving the energy production of cells as with riboflavin.  Doses of 150 mg twice a day have been shown to be effective.   Melatonin: Increasing evidence shows correlation between melatonin secretion and headache conditions.  Melatonin supplementation has decreased headache intensity and duration.  It is widely used as a sleep aid.  Sleep is natures way of dealing with migraine.  A dose of 3 mg is recommended to start for headaches including cluster headache. Higher doses up to 15 mg has been reviewed for use in Cluster headache and have been used. The rationale behind using melatonin for cluster is that many theories regarding the cause of Cluster headache center around the disruption of the normal circadian rhythm in the brain.  This helps restore the normal circadian rhythm.   HEADACHE DIET: Foods and beverages which may trigger migraine Note that only 20% of headache patients are food sensitive. You will know if you are food sensitive if you get a headache consistently 20 minutes to 2 hours after eating a certain food. Only cut out a food if it causes headaches, otherwise you might remove foods you enjoy! What matters most for diet is to eat a well balanced healthy diet full of vegetables and low fat protein, and to not miss meals.   Chocolate, other sweets ALL cheeses except cottage and cream cheese Dairy products, yogurt, sour cream, ice cream Liver Meat extracts (Bovril, Marmite, meat tenderizers) Meats or fish which have undergone aging, fermenting, pickling or smoking. These include: Hotdogs,salami,Lox,sausage, mortadellas,smoked salmon, pepperoni, Pickled herring Pods of broad bean (English beans, Chinese pea pods,  [place] (fava) beans, lima and navy beans Ripe avocado, ripe banana Yeast extracts or active yeast preparations such as Brewer's or Fleishman's (commercial bakes goods are permitted) Tomato based foods, pizza (lasagna, etc.)   MSG (monosodium glutamate) is disguised as many things; look for these common aliases: Monopotassium glutamate Autolysed yeast Hydrolysed protein Sodium caseinate "flavorings" "all natural preservatives" Nutrasweet   Avoid all other foods that convincingly provoke headaches.   Resources: The Dizzy [place] Laundry Your Headache Diet, migrainestrong.com  https://zamora-andrews.com/   Caffeine and Migraine For patients that have migraine, caffeine intake more than 3 days per week can lead to dependency and increased migraine frequency. I would recommend cutting back on your caffeine intake as best you can. The recommended amount of caffeine is 200-300 mg daily, although migraine patients may experience dependency at even lower doses. While you may notice an increase in headache temporarily, cutting back will be helpful for headaches in the long run. For more information on caffeine and migraine, visit: https://americanmigrainefoundation.org/resource-library/caffeine-and-migraine/   Headache Prevention Strategies:   1. Maintain a headache diary; learn to identify and avoid triggers.  - This can be a simple note where you log when you had a headache, associated symptoms, and medications used - There  are several smartphone apps developed to help track migraines: Migraine Buddy, Migraine Monitor, Curelator N1-Headache App   Common triggers include: Emotional triggers: Emotional/Upset family or friends Emotional/Upset occupation Business reversal/success Anticipation anxiety Crisis-serious Post-crisis periodNew job/position   Physical triggers: Vacation Day Weekend Strenuous Exercise High Altitude Location New  Move Menstrual Day Physical Illness Oversleep/Not enough sleep Weather changes Light: Photophobia or light sesnitivity treatment involves a balance between desensitization and reduction in overly strong input. Use dark polarized glasses outside, but not inside. Avoid bright or fluorescent light, but do not dim environment to the point that going into a normally lit room hurts. Consider FL-41 tint lenses, which reduce the most irritating wavelengths without blocking too much light.  These can be obtained at axonoptics.com or theraspecs.com Foods: see list above.   2. Limit use of acute treatments (over-the-counter medications, triptans, etc.) to no more than 2 days per week or 10 days per month to prevent medication overuse headache (rebound headache).     3. Follow a regular schedule (including weekends and holidays): Don't skip meals. Eat a balanced diet. 8 hours of sleep nightly. Minimize stress. Exercise 30 minutes per day. Being overweight is associated with a 5 times increased risk of chronic migraine. Keep well hydrated and drink 6-8 glasses of water per day.   4. Initiate non-pharmacologic measures at the earliest onset of your headache. Rest and quiet environment. Relax and reduce stress. Breathe2Relax is a free app that can instruct you on    some simple relaxtion and breathing techniques. Http://Dawnbuse.com is a    free website that provides teaching videos on relaxation.  Also, there are  many apps that   can be downloaded for "mindful" relaxation.  An app called YOGA NIDRA will help walk you through mindfulness. Another app called Calm can be downloaded to give you a structured mindfulness guide with daily reminders and skill development. Headspace for guided meditation Mindfulness Based Stress Reduction Online Course: www.palousemindfulness.com Cold compresses.   5. Don't wait!! Take the maximum allowable dosage of prescribed medication at the first sign of migraine.   6.  Compliance:  Take prescribed medication regularly as directed and at the first sign of a migraine.   7. Communicate:  Call your physician when problems arise, especially if your headaches change, increase in frequency/severity, or become associated with neurological symptoms (weakness, numbness, slurred speech, etc.). Proceed to emergency room if you experience new or worsening symptoms or symptoms do not resolve, if you have new neurologic symptoms or if headache is severe, or for any concerning symptom.   8. Headache/pain management therapies: Consider various complementary methods, including medication, behavioral therapy, psychological counselling, biofeedback, massage therapy, acupuncture, dry needling, and other modalities.  Such measures may reduce the need for medications. Counseling for pain management, where patients learn to function and ignore/minimize their pain, seems to work very well.   9. Recommend changing family's attention and focus away from patient's headaches. Instead, emphasize daily activities. If first question of day is 'How are your headaches/Do you have a headache today?', then patient will constantly think about headaches, thus making them worse. Goal is to re-direct attention away from headaches, toward daily activities and other distractions.   10. Helpful Websites: www.AmericanHeadacheSociety.org PatentHood.ch www.headaches.org TightMarket.nl www.achenet.org

## 2023-07-04 NOTE — Progress Notes (Signed)
Chief Complaint  Patient presents with   Room 2    Pt is here with her Husband Fayrene Fearing and [employer]. Pt is having a Migraine today. Pt states that she has thrown up within the last 12 hours. Pt stats that she has light and sound sensitivity. Pt states that her nausea everyday.     HISTORY OF PRESENT ILLNESS:  07/04/23 ALL:  Jordan Zimmerman is a 52 y.o. female here today for follow up for migraines. She was seen in consult with Dr Delena Bali 09/2021. She was started on propranolol 20mg  BID and sumatriptan increased to 50mg  PRN. 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 Referral to ophthalmology placed. She was advised to follow up 12/2021. She called 07/2022 with worsening headaches and prorpanolol was increased to 60mg  daily and sumatriptan increased to 100mg  PRN. PCP started [place] about 8 months ago and increased dose about 4 months ago.    Since, she reports daily headaches. She describes a pounding, pressure, throbbing pain. Usually on the right side. + Light and sound sensitivity. Nausea daily. Eye exam last week showed significant changes in prescription needs.   HISTORY (copied from Dr Quentin Mulling previous note)  Medical co-morbidities: fibromyalgia, depression, kidney stones  The patient presents for evaluation of headaches which began several years ago. The past month her migraines have been more severe. She injured her back around this time and thinks this may have triggered the worsening of her headaches. Service dog also passed away around this time. They have been slightly better this past week. Prior to that would have a migraine every other day. She does have lower level headaches on the days she does not have migraines. Migraines are described as frontal throbbing with associated photophobia, phonophobia, nausea, and vomiting.  Saw her PCP who ordered MRI brain but this hasn't been scheduled yet. Also prescribed Imitrex 25 mg, zofran 4 mg, and flexeril 5 mg PRN. Imitrex worked but would have  to take a second dose after 8 hours because headache would return. Takes Zofran as needed for nausea. She has thrown up her rescue pills before. Takes flexeril for back spasms.  Takes Topamax and amitriptyline for headache prevention which she has been on for 2-3 years. Amitriptyline helps with sleep but she does not feel that Topamax has been particularly helpful.  Headache History: Onset: several years ago Triggers: stress Aura: no Location: right frontal or bifrontal Quality/Description: throbbing Associated Symptoms:  Photophobia: yes  Phonophobia: yes  Nausea: yes Vomiting: yes Worse with activity?: yes Duration of headaches: 1 hour with Imitrex  Headache days per month: 30 Headache free days per month: 0  Current Treatment: Abortive Imitrex 25 mg PRN Zofran  Preventative Amitriptyline 100 mg QHS Topamax 50 mg QHS  Prior Therapies                                 Amitriptyline 100 mg QHS Topamax 50 mg QHS Cymbalta 60 mg daily Gabapentin 800 mg TID Imitrex 25 mg PRN Zofran Flexeril  Headache Risk Factors: Headache risk factors and/or co-morbidities (+) Neck Pain (+) Back Pain (+) Sleep Disorder - insomnia (-) Obesity  Body mass index is 24.96 kg/m. (-) History of Traumatic Brain Injury and/or Concussion   REVIEW OF SYSTEMS: Out of a complete 14 system review of symptoms, the patient complains only of the following symptoms, headaches, nausea, anxiety and all other reviewed systems are negative.   ALLERGIES: Allergies  Allergen Reactions   Ambien [Zolpidem Tartrate] Anaphylaxis   Amoxicillin Hives and Swelling    Fever.    Penicillins Hives and Other (See Comments)    High fever     HOME MEDICATIONS: Outpatient Medications Prior to Visit  Medication Sig Dispense Refill   acetaminophen (TYLENOL) 500 MG tablet Take 1,000 mg by mouth every 6 (six) hours as needed.     propranolol ER (INDERAL LA) 60 MG 24 hr capsule TAKE 1 CAPSULE BY MOUTH EVERY DAY 90  capsule 2   QULIPTA 30 MG TABS TAKE 1 TABLET BY MOUTH EVERY DAY 30 tablet 2   mirabegron ER (MYRBETRIQ) 50 MG TB24 tablet Take 1 tablet (50 mg total) by mouth daily. (Patient not taking: Reported on 07/04/2023) 30 tablet 11   No facility-administered medications prior to visit.     PAST MEDICAL HISTORY: Past Medical History:  Diagnosis Date   Anxiety    Chronic fatigue    Fibromyalgia    History of kidney stones    IBS (irritable bowel syndrome)    Kidney stones    Migraines      PAST SURGICAL HISTORY: Past Surgical History:  Procedure Laterality Date   CHOLECYSTECTOMY  2001   COLONOSCOPY N/A 07/21/2021   Procedure: COLONOSCOPY;  Surgeon: [name], MD;  Location: [facility] ENDOSCOPY;  Service: Gastroenterology;  Laterality: N/A;   CYSTOSCOPY W/ URETEROSCOPY  ~ 2008   "pulled stone rest of the way out" (05/21/2013)   CYSTOSCOPY/URETEROSCOPY/HOLMIUM LASER/STENT PLACEMENT Left 03/28/2023   Procedure: CYSTOSCOPY/URETEROSCOPY/HOLMIUM LASER/STENT PLACEMENT;  Surgeon: Vanna Scotland, MD;  Location: [facility] ORS;  Service: Urology;  Laterality: Left;   EXTRACORPOREAL SHOCK WAVE LITHOTRIPSY Right 10/06/2017   Procedure: RIGHT EXTRACORPOREAL SHOCK WAVE LITHOTRIPSY (ESWL);  Surgeon: Bjorn Pippin, MD;  Location: [facility] ORS;  Service: Urology;  Laterality: Right;   FOOT FRACTURE SURGERY Bilateral    TONSILLECTOMY  1992     FAMILY HISTORY: Family History  Problem Relation Age of Onset   Diabetes Mother    Hypertension Father    Diabetes Father    Prostate cancer Father    Other Brother        Disk Disease   Breast cancer Maternal Grandmother    Breast cancer Maternal Aunt 18     SOCIAL HISTORY: Social History   Socioeconomic History   Marital status: Married    Spouse name: Fayrene Fearing   Number of children: 2   Years of education: high school   Highest education level: Not on file  Occupational History   Not on file  Tobacco Use   Smoking status: Former    Current packs/day: 0.00     Average packs/day: 1 pack/day for 4.0 years (4.0 ttl pk-yrs)    Types: Cigarettes    Start date: 11/29/1990    Quit date: 11/29/1994    Years since quitting: 28.6    Passive exposure: Past   Smokeless tobacco: Never  Vaping Use   Vaping status: Never Used  Substance and Sexual Activity   Alcohol use: No    Comment: occas   Drug use: Yes    Types: Marijuana    Comment: <1 week    Sexual activity: Yes    Birth control/protection: Surgical  Other Topics Concern   Not on file  Social History Narrative   03/09/21   From: the area   Living: with husband, Fayrene Fearing (1994) and daughter   Work: homemaker      Family: John and Florentina Addison (married and lives nearby) -  2 grandchildren      Enjoys: garden      Exercise: pool in the weather is nice, walking   Diet: IBS limits her options       Safety   Seat belts: Yes    Guns: Yes  and secure   Safe in relationships: Yes    Social Determinants of Health   Financial Resource Strain: Not on file  Food Insecurity: Not on file  Transportation Needs: Not on file  Physical Activity: Not on file  Stress: Not on file  Social Connections: Not on file  Intimate Partner Violence: Not on file     PHYSICAL EXAM  Vitals:   07/04/23 1429  BP: 112/72  Pulse: 84  Weight: 130 lb 8 oz (59.2 kg)  Height: 5\' 3"  (1.6 m)   Body mass index is 23.12 kg/m.  Generalized: Well developed, in no acute distress  Cardiology: normal rate and rhythm, no murmur auscultated  Respiratory: clear to auscultation bilaterally    Neurological examination  Mentation: Alert oriented to time, place, history taking. Follows all commands speech and language fluent Cranial nerve II-XII: Pupils were equal round reactive to light. Extraocular movements were full, visual field were full on confrontational test. Facial sensation and strength were normal. Uvula tongue midline. Head turning and shoulder shrug  were normal and symmetric. Motor: The motor testing reveals 5 over 5  strength of all 4 extremities. Good symmetric motor tone is noted throughout.  Sensory: Sensory testing is intact to soft touch on all 4 extremities. No evidence of extinction is noted.  Coordination: Cerebellar testing reveals good finger-nose-finger and heel-to-shin bilaterally.  Gait and station: Gait is normal. Tandem gait is normal. Romberg is negative. No drift is seen.  Reflexes: Deep tendon reflexes are symmetric and normal bilaterally.    DIAGNOSTIC DATA (LABS, IMAGING, TESTING) - I reviewed patient records, labs, notes, testing and imaging myself where available.  Lab Results  Component Value Date   WBC 11.1 (H) 01/28/2023   HGB 13.2 01/28/2023   HCT 40.9 01/28/2023   MCV 85.7 01/28/2023   PLT 393.0 01/28/2023      Component Value Date/Time   NA 141 06/29/2023 1453   K 4.6 06/29/2023 1453   CL 104 06/29/2023 1453   CO2 23 06/29/2023 1453   GLUCOSE 84 06/29/2023 1453   GLUCOSE 111 (H) 01/19/2023 1557   BUN 14 06/29/2023 1453   CREATININE 1.14 (H) 06/29/2023 1453   CALCIUM 10.0 06/29/2023 1453   PROT 7.0 06/22/2021 1613   PROT 6.5 06/03/2017 0917   ALBUMIN 4.4 06/22/2021 1613   ALBUMIN 4.3 06/03/2017 0917   AST 13 06/22/2021 1613   ALT 6 06/22/2021 1613   ALKPHOS 67 06/22/2021 1613   BILITOT 0.3 06/22/2021 1613   BILITOT <0.2 06/03/2017 0917   GFRNONAA >60 10/01/2017 0252   GFRAA >60 10/01/2017 0252   Lab Results  Component Value Date   CHOL 184 03/09/2021   HDL 51.10 03/09/2021   LDLCALC 120 (H) 03/09/2021   TRIG 64.0 03/09/2021   CHOLHDL 4 03/09/2021   Lab Results  Component Value Date   HGBA1C 5.4 06/22/2021   Lab Results  Component Value Date   VITAMINB12 >2000 (H) 07/13/2017   Lab Results  Component Value Date   TSH 1.58 01/03/2023        No data to display               No data to display  ASSESSMENT AND PLAN  52 y.o. year old female  has a past medical history of Anxiety, Chronic fatigue, Fibromyalgia, History of  kidney stones, IBS (irritable bowel syndrome), Kidney stones, and Migraines. here with    Intractable migraine with aura without status migrainosus  Marchita Whetsell reports daily headache, all being described as migrainous. She is having significant nausea daily on Qulipta. We will switch to Ajovy injections every 30 days. Sample pen given in office. She will continue propranolol 60mg  daily. I will have her try rizatriptan 10mg  as needed for abortive therapy. May also use ondansetron as needed. Prednisone taper to break current cycle. Possible side effects, appropriate administration and storage of meds discussed. Healthy lifestyle habits encouraged. She will follow up with PCP as directed. She will return to see me in 6 months, sooner if needed. She verbalizes understanding and agreement with this plan.   No orders of the defined types were placed in this encounter.    Meds ordered this encounter  Medications   Fremanezumab-vfrm (AJOVY) 225 MG/1.5ML SOAJ    Sig: Inject 225 mg into the skin every 30 (thirty) days.    Dispense:  4.5 mL    Refill:  3    Order Specific Question:   Supervising Provider    Answer:   Anson Fret [[id number]]   rizatriptan (MAXALT-MLT) 10 MG disintegrating tablet    Sig: Take 1 tablet (10 mg total) by mouth as needed for migraine. May repeat in 2 hours if needed    Dispense:  9 tablet    Refill:  11    Order Specific Question:   Supervising Provider    Answer:   Anson Fret [[id number]]   predniSONE (STERAPRED UNI-PAK 21 TAB) 10 MG (21) TBPK tablet    Sig: Taper pack as directed    Dispense:  1 each    Refill:  0    Order Specific Question:   Supervising Provider    Answer:   Anson Fret [[id number]]   ondansetron (ZOFRAN) 4 MG tablet    Sig: Take 1 tablet (4 mg total) by mouth every 8 (eight) hours as needed for nausea or vomiting.    Dispense:  20 tablet    Refill:  0    Order Specific Question:   Supervising Provider    Answer:   Anson Fret  [[id number]]     OZD GUYQI, MSN, FNP-C 07/04/2023, 2:59 PM  [facility] [address] [place], [place] [address] [phone]

## 2023-07-05 ENCOUNTER — Encounter: Payer: Self-pay | Admitting: Surgery

## 2023-07-05 ENCOUNTER — Ambulatory Visit (INDEPENDENT_AMBULATORY_CARE_PROVIDER_SITE_OTHER): Payer: BC Managed Care – PPO | Admitting: Surgery

## 2023-07-05 VITALS — BP 110/76 | HR 75 | Temp 98.4°F | Ht 65.0 in | Wt 128.0 lb

## 2023-07-05 DIAGNOSIS — K430 Incisional hernia with obstruction, without gangrene: Secondary | ICD-10-CM

## 2023-07-05 DIAGNOSIS — K432 Incisional hernia without obstruction or gangrene: Secondary | ICD-10-CM

## 2023-07-05 NOTE — Patient Instructions (Signed)
You have requested to have a Ventral Hernia Repair. This will be done by Dr Claudine Mouton at [facility]. Please see your (BLUE) Pre-care sheet for more information. Our surgery scheduler will call you to look at surgery dates and to go over surgery information.   You will need to arrange to be out of work for approximately 1-2 weeks and then you may return with a lifting restriction for 4 more weeks. If you have FMLA or Disability paperwork that needs to be filled out, please have your company fax your paperwork to [phone] or you may drop this by either office. This paperwork will be filled out within 3 days after your surgery has been completed.     Ventral Hernia A ventral hernia (also called an incisional hernia) is a hernia that occurs at the site of a previous surgical cut (incision) in the abdomen. The abdominal wall spans from your lower chest down to your pelvis. If the abdominal wall is weakened from a surgical incision, a hernia can occur. A hernia is a bulge of bowel or muscle tissue pushing out on the weakened part of the abdominal wall. Ventral hernias can get bigger from straining or lifting. Obese and older people are at higher risk for a ventral hernia. People who develop infections after surgery or require repeat incisions at the same site on the abdomen are also at increased risk. CAUSES  A ventral hernia occurs because of weakness in the abdominal wall at an incision site.  SYMPTOMS  Common symptoms include: A visible bulge or lump on the abdominal wall. Pain or tenderness around the lump. Increased discomfort if you cough or make a sudden movement. If the hernia has blocked part of the intestine, a serious complication can occur (incarcerated or strangulated hernia). This can become a problem that requires emergency surgery because the blood flow to the blocked intestine may be cut off. Symptoms may include: Feeling sick to your stomach (nauseous). Throwing up (vomiting). Stomach  swelling (distention) or bloating. Fever. Rapid heartbeat. DIAGNOSIS  Your health care provider will take a medical history and perform a physical exam. Various tests may be ordered, such as: Blood tests. Urine tests. Ultrasonography. X-rays. Computed tomography (CT). TREATMENT  Watchful waiting may be all that is needed for a smaller hernia that does not cause symptoms. Your health care provider may recommend the use of a supportive belt (truss) that helps to keep the abdominal wall intact. For larger hernias or those that cause pain, surgery to repair the hernia is usually recommended. If a hernia becomes strangulated, emergency surgery needs to be done right away. HOME CARE INSTRUCTIONS Avoid putting pressure or strain on the abdominal area. Avoid heavy lifting. Use good body positioning for physical tasks. Ask your health care provider about proper body positioning. Use a supportive belt as directed by your health care provider. Maintain a healthy weight. Eat foods that are high in fiber, such as whole grains, fruits, and vegetables. Fiber helps prevent difficult bowel movements (constipation). Drink enough fluids to keep your urine clear or pale yellow. Follow up with your health care provider as directed. SEEK MEDICAL CARE IF:  Your hernia seems to be getting larger or more painful. SEEK IMMEDIATE MEDICAL CARE IF:  You have abdominal pain that is sudden and sharp. Your pain becomes severe. You have repeated vomiting. You are sweating a lot. You notice a rapid heartbeat. You develop a fever. MAKE SURE YOU:  Understand these instructions. Will watch your condition. Will get help  right away if you are not doing well or get worse.     Open Ventral Hernia Repair Open ventral hernia repair is a surgery to fix a ventral hernia. A ventral hernia,  is a bulge of body tissue or intestines that pushes through the front part of the abdomen. This can happen if the connective tissue  covering the muscles over the abdomen has a weak spot or is torn because of a surgical cut (incision) from a previous surgery. A ventral hernia repair is often done soon after diagnosis to stop the hernia from getting bigger, becoming uncomfortable, or becoming an emergency. This surgery usually takes about 2 hours, but the time can vary greatly.  LET [facility] CARE PROVIDER KNOW ABOUT: Any allergies you have. All medicines you are taking, including steroids, vitamins, herbs, eye drops, creams, and over-the-counter medicines. Previous problems you or members of your family have had with the use of anesthetics. Any blood disorders you have. Previous surgeries you have had. Medical conditions you have.  RISKS AND COMPLICATIONS  Generally, Open ventral hernia repair is a safe procedure. However, as with any surgical procedure, problems can occur. Possible problems include: Bleeding. Trouble passing urine or having a bowel movement after the surgery. Infection. Pneumonia. Blood clots. Pain in the area of the hernia. A bulge in the area of the hernia that may be caused by a collection of fluid. Injury to intestines or other structures in the abdomen. Return of the hernia after surgery.  BEFORE THE PROCEDURE  You may need to have blood tests, urine tests, a chest X-ray, or an electrocardiogram done before the day of the surgery. Ask your health care provider about changing or stopping your regular medicines. This is especially important if you are taking diabetes medicines or blood thinners. You may need to wash with a special type of germ-killing soap. Do not eat or drink anything after midnight the night before the procedure or as directed by your health care provider. Make plans to have someone drive you home after the procedure.  PROCEDURE  Small monitors will be put on your body. They are used to check your heart, blood pressure, and oxygen level. An IV access tube will be put into a  vein in your hand or arm. Fluids and medicine will flow directly into your body through the IV tube. You will be given medicine that makes you go to sleep (general anesthetic). Your abdomen will be cleaned with a special soap to kill any germs on your skin. Once you are asleep, a moderate - large size incision will be made in your abdomen. The size of incision depends on how large your hernia is. Your surgeon puts the tissue or intestines that formed the hernia back in place. A screen-like patch (mesh) is used to close the hernia. This helps make the area stronger. Stitches, tacks, or staples are used to keep the mesh in place. Medicine and a bandage (dressing) or skin glue will be put over the incision.  AFTER THE PROCEDURE  You will stay in a recovery area until the anesthetic wears off. Your blood pressure and pulse will be checked often. You may be able to go home the same day or may need to stay in the hospital for 1-2 days after surgery. Your surgeon will decide when you can go home depending upon your recovery. You may feel some pain. You will be given medicine for pain. You will be urged to do breathing exercises that involve  taking deep breaths. This helps prevent a lung infection after a surgery. You may have to wear compression stockings while you are in the hospital. These stockings help keep blood clots from forming in your legs.   This information is not intended to replace advice given to you by your health care provider. Make sure you discuss any questions you have with your health care provider.   Document Released: 11/01/2012 Document Revised: 11/20/2013 Document Reviewed: 11/01/2012 Elsevier Interactive Patient Education [employer]

## 2023-07-06 ENCOUNTER — Ambulatory Visit: Payer: Self-pay | Admitting: Surgery

## 2023-07-06 ENCOUNTER — Telehealth: Payer: Self-pay | Admitting: Surgery

## 2023-07-06 DIAGNOSIS — K432 Incisional hernia without obstruction or gangrene: Secondary | ICD-10-CM | POA: Insufficient documentation

## 2023-07-06 NOTE — Telephone Encounter (Signed)
Patient has been advised of Pre-Admission date/time, and Surgery date at [facility].  Surgery Date: 07/20/23 Preadmission Testing Date: 07/12/23 (phone 8a-1p)  Patient has been made aware to call [phone], between 1-3:00pm the day before surgery, to find out what time to arrive for surgery.

## 2023-07-12 ENCOUNTER — Other Ambulatory Visit: Payer: Self-pay

## 2023-07-12 ENCOUNTER — Encounter
Admission: RE | Admit: 2023-07-12 | Discharge: 2023-07-12 | Disposition: A | Discharge status: Home / Self Care | Payer: BC Managed Care – PPO | Source: Ambulatory Visit | Attending: Surgery | Admitting: Surgery

## 2023-07-12 VITALS — Ht 65.0 in | Wt 128.0 lb

## 2023-07-12 DIAGNOSIS — Z01818 Encounter for other preprocedural examination: Secondary | ICD-10-CM

## 2023-07-12 HISTORY — DX: Other chronic pain: G89.29

## 2023-07-12 HISTORY — DX: Post-traumatic stress disorder, unspecified: F43.10

## 2023-07-12 HISTORY — DX: Syncope and collapse: R55

## 2023-07-12 HISTORY — DX: Depression, unspecified: F32.A

## 2023-07-12 HISTORY — DX: Endometriosis, unspecified: N80.9

## 2023-07-12 NOTE — Patient Instructions (Signed)
Your procedure is scheduled on: Wednesday 07/20/23 To find out your arrival time, please call [phone] between 1PM - 3PM on:   Tuesday 07/19/23 Report to the Registration Desk on the 1st floor of the Medical Mall. Free Valet parking is available.  If your arrival time is 6:00 am, do not arrive before that time as the [facility] entrance doors do not open until 6:00 am.  REMEMBER: Instructions that are not followed completely may result in serious medical risk, up to and including death; or upon the discretion of your surgeon and anesthesiologist your surgery may need to be rescheduled.  Do not eat food or drink any liquids after midnight the night before surgery.  No gum chewing or hard candies.  One week prior to surgery: Stop Anti-inflammatories (NSAIDS) such as Advil, Aleve, Ibuprofen, Motrin, Naproxen, Naprosyn and Aspirin based products such as Excedrin, Goody's Powder, BC Powder. You may however, continue to take Tylenol if needed for pain up until the day of surgery.  Stop ANY OVER THE COUNTER supplements or vitamins until after surgery.  Continue taking all prescribed medications.   TAKE ONLY THESE MEDICATIONS THE MORNING OF SURGERY WITH A SIP OF WATER:  propranolol ER (INDERAL LA) 60 MG 24 hr capsule   No Alcohol for 24 hours before or after surgery.  No Smoking including e-cigarettes for 24 hours before surgery.  No chewable tobacco products for at least 6 hours before surgery.  No nicotine patches on the day of surgery.  Do not use any "recreational" drugs for at least a week (preferably 2 weeks) before your surgery.  Please be advised that the combination of cocaine and anesthesia may have negative outcomes, up to and including death. If you test positive for cocaine, your surgery will be cancelled.  On the morning of surgery brush your teeth with toothpaste and water, you may rinse your mouth with mouthwash if you wish. Do not swallow any toothpaste or  mouthwash.  Use CHG Soap or wipes as directed on instruction sheet.  Do not wear lotions, powders, or perfumes.   Do not shave body hair from the neck down 48 hours before surgery.  Wear comfortable clothing (specific to your surgery type) to the hospital.  Do not wear jewelry, make-up, hairpins, clips or nail polish.  Contact lenses, hearing aids and dentures may not be worn into surgery.  Do not bring valuables to the hospital. [facility] is not responsible for any missing/lost belongings or valuables.   Notify your doctor if there is any change in your medical condition (cold, fever, infection).  If you are being discharged the day of surgery, you will not be allowed to drive home. You will need a responsible individual to drive you home and stay with you for 24 hours after surgery.   If you are taking public transportation, you will need to have a responsible individual with you.  If you are being admitted to the hospital overnight, leave your suitcase in the car. After surgery it may be brought to your room.  In case of increased patient census, it may be necessary for you, the patient, to continue your postoperative care in the Same Day Surgery department.  After surgery, you can help prevent lung complications by doing breathing exercises.  Take deep breaths and cough every 1-2 hours. Your doctor may order a device called an Incentive Spirometer to help you take deep breaths. When coughing or sneezing, hold a pillow firmly against your incision with both  hands. This is called "splinting." Doing this helps protect your incision. It also decreases belly discomfort.  Surgery Visitation Policy:  Patients undergoing a surgery or procedure may have two family members or support persons with them as long as the person is not COVID-19 positive or experiencing its symptoms.   Inpatient Visitation:    Visiting hours are 7 a.m. to 8 p.m. Up to four visitors are allowed at one time  in a patient room. The visitors may rotate out with other people during the day. One designated support person (adult) may remain overnight.  Please call the Pre-admissions Testing Dept. at [phone] if you have any questions about these instructions.     Preparing for Surgery with CHLORHEXIDINE GLUCONATE (CHG) Soap  Chlorhexidine Gluconate (CHG) Soap  o An antiseptic cleaner that kills germs and bonds with the skin to continue killing germs even after washing  o Used for showering the night before surgery and morning of surgery  Before surgery, you can play an important role by reducing the number of germs on your skin.  CHG (Chlorhexidine gluconate) soap is an antiseptic cleanser which kills germs and bonds with the skin to continue killing germs even after washing.  Please do not use if you have an allergy to CHG or antibacterial soaps. If your skin becomes reddened/irritated stop using the CHG.  1. Shower the NIGHT BEFORE SURGERY and the MORNING OF SURGERY with CHG soap.  2. If you choose to wash your hair, wash your hair first as usual with your normal shampoo.  3. After shampooing, rinse your hair and body thoroughly to remove the shampoo.  4. Use CHG as you would any other liquid soap. You can apply CHG directly to the skin and wash gently with a scrungie or a clean washcloth.  5. Apply the CHG soap to your body only from the neck down. Do not use on open wounds or open sores. Avoid contact with your eyes, ears, mouth, and genitals (private parts). Wash face and genitals (private parts) with your normal soap.  6. Wash thoroughly, paying special attention to the area where your surgery will be performed.  7. Thoroughly rinse your body with warm water.  8. Do not shower/wash with your normal soap after using and rinsing off the CHG soap.  9. Pat yourself dry with a clean towel.  10. Wear clean pajamas to bed the night before surgery.  12. Place clean sheets on your  bed the night of your first shower and do not sleep with pets.  13. Shower again with the CHG soap on the day of surgery prior to arriving at the hospital.  14. Do not apply any deodorants/lotions/powders.  15. Please wear clean clothes to the hospital.

## 2023-07-13 ENCOUNTER — Encounter: Payer: Self-pay | Admitting: Family

## 2023-07-13 ENCOUNTER — Encounter: Payer: Self-pay | Admitting: Surgery

## 2023-07-14 NOTE — Telephone Encounter (Signed)
Tried to call pt to get more details on her symptoms and to give information on how to receive treatment.

## 2023-07-20 ENCOUNTER — Other Ambulatory Visit: Payer: Self-pay

## 2023-07-20 ENCOUNTER — Ambulatory Visit: Payer: BC Managed Care – PPO

## 2023-07-20 ENCOUNTER — Encounter: Payer: Self-pay | Admitting: Surgery

## 2023-07-20 ENCOUNTER — Ambulatory Visit
Admission: RE | Admit: 2023-07-20 | Discharge: 2023-07-20 | Disposition: A | Discharge status: Home / Self Care | Payer: BC Managed Care – PPO | Attending: Surgery | Admitting: Surgery

## 2023-07-20 ENCOUNTER — Other Ambulatory Visit: Payer: Self-pay | Admitting: Surgery

## 2023-07-20 ENCOUNTER — Encounter
Admission: RE | Disposition: A | Discharge status: Home / Self Care | Payer: Self-pay | Source: Home / Self Care | Attending: Surgery

## 2023-07-20 DIAGNOSIS — Z87891 Personal history of nicotine dependence: Secondary | ICD-10-CM | POA: Diagnosis not present

## 2023-07-20 DIAGNOSIS — K436 Other and unspecified ventral hernia with obstruction, without gangrene: Secondary | ICD-10-CM

## 2023-07-20 DIAGNOSIS — K432 Incisional hernia without obstruction or gangrene: Secondary | ICD-10-CM

## 2023-07-20 DIAGNOSIS — Z01818 Encounter for other preprocedural examination: Secondary | ICD-10-CM

## 2023-07-20 DIAGNOSIS — G43909 Migraine, unspecified, not intractable, without status migrainosus: Secondary | ICD-10-CM | POA: Insufficient documentation

## 2023-07-20 DIAGNOSIS — M797 Fibromyalgia: Secondary | ICD-10-CM | POA: Diagnosis not present

## 2023-07-20 DIAGNOSIS — Z9049 Acquired absence of other specified parts of digestive tract: Secondary | ICD-10-CM | POA: Insufficient documentation

## 2023-07-20 DIAGNOSIS — K430 Incisional hernia with obstruction, without gangrene: Secondary | ICD-10-CM | POA: Diagnosis not present

## 2023-07-20 DIAGNOSIS — K420 Umbilical hernia with obstruction, without gangrene: Secondary | ICD-10-CM | POA: Diagnosis not present

## 2023-07-20 HISTORY — PX: UMBILICAL HERNIA REPAIR: SHX196

## 2023-07-20 LAB — POCT PREGNANCY, URINE: Preg Test, Ur: NEGATIVE

## 2023-07-20 SURGERY — REPAIR, HERNIA, UMBILICAL, ADULT
Anesthesia: General

## 2023-07-20 MED ORDER — DEXAMETHASONE SODIUM PHOSPHATE 10 MG/ML IJ SOLN
INTRAMUSCULAR | Status: AC
  Filled 2023-07-20: qty 1

## 2023-07-20 MED ORDER — FENTANYL CITRATE (PF) 100 MCG/2ML IJ SOLN
INTRAMUSCULAR | Status: AC
  Filled 2023-07-20: qty 2

## 2023-07-20 MED ORDER — BUPIVACAINE-EPINEPHRINE 0.25% -1:200000 IJ SOLN
INTRAMUSCULAR | Status: DC | PRN
  Administered 2023-07-20: 20 mL

## 2023-07-20 MED ORDER — PHENYLEPHRINE HCL (PRESSORS) 10 MG/ML IV SOLN
INTRAVENOUS | Status: DC | PRN
  Administered 2023-07-20 (×3): 80 ug via INTRAVENOUS

## 2023-07-20 MED ORDER — SEVOFLURANE IN SOLN
RESPIRATORY_TRACT | Status: AC
  Filled 2023-07-20: qty 250

## 2023-07-20 MED ORDER — ACETAMINOPHEN 500 MG PO TABS
1000.0000 mg | ORAL_TABLET | ORAL | Status: AC
  Administered 2023-07-20: 1000 mg via ORAL

## 2023-07-20 MED ORDER — ONDANSETRON HCL 4 MG/2ML IJ SOLN
INTRAMUSCULAR | Status: AC
  Filled 2023-07-20: qty 2

## 2023-07-20 MED ORDER — CIPROFLOXACIN IN D5W 400 MG/200ML IV SOLN
400.0000 mg | INTRAVENOUS | Status: AC
  Administered 2023-07-20: 400 mg via INTRAVENOUS

## 2023-07-20 MED ORDER — ROCURONIUM BROMIDE 10 MG/ML (PF) SYRINGE
PREFILLED_SYRINGE | INTRAVENOUS | Status: AC
  Filled 2023-07-20: qty 10

## 2023-07-20 MED ORDER — MIDAZOLAM HCL 2 MG/2ML IJ SOLN
INTRAMUSCULAR | Status: DC | PRN
  Administered 2023-07-20: 2 mg via INTRAVENOUS

## 2023-07-20 MED ORDER — CELECOXIB 200 MG PO CAPS
ORAL_CAPSULE | ORAL | Status: AC
  Filled 2023-07-20: qty 1

## 2023-07-20 MED ORDER — DEXMEDETOMIDINE HCL IN NACL 80 MCG/20ML IV SOLN
INTRAVENOUS | Status: AC
  Filled 2023-07-20: qty 20

## 2023-07-20 MED ORDER — ORAL CARE MOUTH RINSE: 15.0000 mL | Freq: Once | OROMUCOSAL | Status: AC

## 2023-07-20 MED ORDER — FAMOTIDINE 20 MG PO TABS
20.0000 mg | ORAL_TABLET | Freq: Once | ORAL | Status: AC
  Administered 2023-07-20: 20 mg via ORAL

## 2023-07-20 MED ORDER — PHENYLEPHRINE 80 MCG/ML (10ML) SYRINGE FOR IV PUSH (FOR BLOOD PRESSURE SUPPORT)
PREFILLED_SYRINGE | INTRAVENOUS | Status: AC
  Filled 2023-07-20: qty 10

## 2023-07-20 MED ORDER — GABAPENTIN 300 MG PO CAPS
ORAL_CAPSULE | ORAL | Status: AC
  Filled 2023-07-20: qty 1

## 2023-07-20 MED ORDER — OXYCODONE HCL 5 MG PO TABS
5.0000 mg | ORAL_TABLET | Freq: Once | ORAL | Status: AC | PRN
  Administered 2023-07-20: 5 mg via ORAL

## 2023-07-20 MED ORDER — LACTATED RINGERS IV SOLN: INTRAVENOUS | Status: DC

## 2023-07-20 MED ORDER — MIDAZOLAM HCL 2 MG/2ML IJ SOLN
INTRAMUSCULAR | Status: AC
  Filled 2023-07-20: qty 2

## 2023-07-20 MED ORDER — BUPIVACAINE-EPINEPHRINE (PF) 0.25% -1:200000 IJ SOLN
INTRAMUSCULAR | Status: AC
  Filled 2023-07-20: qty 30

## 2023-07-20 MED ORDER — ONDANSETRON HCL 4 MG/2ML IJ SOLN
INTRAMUSCULAR | Status: DC | PRN
Start: 2023-07-20 — End: 2023-07-20
  Administered 2023-07-20: 4 mg via INTRAVENOUS

## 2023-07-20 MED ORDER — FENTANYL CITRATE (PF) 100 MCG/2ML IJ SOLN
INTRAMUSCULAR | Status: DC | PRN
  Administered 2023-07-20 (×4): 25 ug via INTRAVENOUS

## 2023-07-20 MED ORDER — ROCURONIUM BROMIDE 100 MG/10ML IV SOLN
INTRAVENOUS | Status: DC | PRN
  Administered 2023-07-20: 50 mg via INTRAVENOUS

## 2023-07-20 MED ORDER — GABAPENTIN 300 MG PO CAPS
300.0000 mg | ORAL_CAPSULE | ORAL | Status: AC
  Administered 2023-07-20: 300 mg via ORAL

## 2023-07-20 MED ORDER — HYDROCODONE-ACETAMINOPHEN 5-325 MG PO TABS: 1.0000 | ORAL_TABLET | Freq: Four times a day (QID) | ORAL | 0 refills | Status: DC | PRN

## 2023-07-20 MED ORDER — ACETAMINOPHEN 500 MG PO TABS
ORAL_TABLET | ORAL | Status: AC
  Filled 2023-07-20: qty 2

## 2023-07-20 MED ORDER — CEFAZOLIN SODIUM-DEXTROSE 2-4 GM/100ML-% IV SOLN
INTRAVENOUS | Status: AC
  Filled 2023-07-20: qty 100

## 2023-07-20 MED ORDER — CHLORHEXIDINE GLUCONATE CLOTH 2 % EX PADS: 6.0000 | MEDICATED_PAD | Freq: Once | CUTANEOUS | Status: DC

## 2023-07-20 MED ORDER — FAMOTIDINE 20 MG PO TABS
ORAL_TABLET | ORAL | Status: AC
  Filled 2023-07-20: qty 1

## 2023-07-20 MED ORDER — CHLORHEXIDINE GLUCONATE CLOTH 2 % EX PADS
6.0000 | MEDICATED_PAD | Freq: Once | CUTANEOUS | Status: AC
  Administered 2023-07-20: 6 via TOPICAL

## 2023-07-20 MED ORDER — OXYCODONE HCL 5 MG PO TABS
ORAL_TABLET | ORAL | Status: AC
  Filled 2023-07-20: qty 1

## 2023-07-20 MED ORDER — FENTANYL CITRATE (PF) 100 MCG/2ML IJ SOLN
25.0000 ug | INTRAMUSCULAR | Status: DC | PRN
  Administered 2023-07-20 (×4): 25 ug via INTRAVENOUS

## 2023-07-20 MED ORDER — BUPIVACAINE LIPOSOME 1.3 % IJ SUSP: 20.0000 mL | Freq: Once | INTRAMUSCULAR | Status: DC

## 2023-07-20 MED ORDER — CELECOXIB 200 MG PO CAPS
200.0000 mg | ORAL_CAPSULE | ORAL | Status: AC
  Administered 2023-07-20: 200 mg via ORAL

## 2023-07-20 MED ORDER — OXYCODONE HCL 5 MG/5ML PO SOLN: 5.0000 mg | Freq: Once | ORAL | Status: AC | PRN

## 2023-07-20 MED ORDER — SUGAMMADEX SODIUM 200 MG/2ML IV SOLN
INTRAVENOUS | Status: DC | PRN
  Administered 2023-07-20: 200 mg via INTRAVENOUS

## 2023-07-20 MED ORDER — LIDOCAINE HCL (CARDIAC) PF 100 MG/5ML IV SOSY
PREFILLED_SYRINGE | INTRAVENOUS | Status: DC | PRN
  Administered 2023-07-20: 60 mg via INTRAVENOUS

## 2023-07-20 MED ORDER — GLYCOPYRROLATE 0.2 MG/ML IJ SOLN
INTRAMUSCULAR | Status: AC
  Filled 2023-07-20: qty 1

## 2023-07-20 MED ORDER — CHLORHEXIDINE GLUCONATE 0.12 % MT SOLN
15.0000 mL | Freq: Once | OROMUCOSAL | Status: AC
  Administered 2023-07-20: 15 mL via OROMUCOSAL

## 2023-07-20 MED ORDER — CHLORHEXIDINE GLUCONATE 0.12 % MT SOLN
OROMUCOSAL | Status: AC
  Filled 2023-07-20: qty 15

## 2023-07-20 MED ORDER — DEXAMETHASONE SODIUM PHOSPHATE 10 MG/ML IJ SOLN
INTRAMUSCULAR | Status: DC | PRN
  Administered 2023-07-20: 5 mg via INTRAVENOUS

## 2023-07-20 MED ORDER — PROPOFOL 10 MG/ML IV BOLUS
INTRAVENOUS | Status: DC | PRN
Start: 2023-07-20 — End: 2023-07-20
  Administered 2023-07-20: 150 mg via INTRAVENOUS

## 2023-07-20 MED ORDER — SUCCINYLCHOLINE CHLORIDE 200 MG/10ML IV SOSY
PREFILLED_SYRINGE | INTRAVENOUS | Status: AC
  Filled 2023-07-20: qty 10

## 2023-07-20 MED ORDER — PROPOFOL 10 MG/ML IV BOLUS
INTRAVENOUS | Status: AC
  Filled 2023-07-20: qty 40

## 2023-07-20 MED ORDER — EPHEDRINE 5 MG/ML INJ
INTRAVENOUS | Status: AC
  Filled 2023-07-20: qty 5

## 2023-07-20 MED ORDER — DEXMEDETOMIDINE HCL IN NACL 80 MCG/20ML IV SOLN
INTRAVENOUS | Status: DC | PRN
  Administered 2023-07-20 (×2): 8 ug via INTRAVENOUS
  Administered 2023-07-20: 4 ug via INTRAVENOUS

## 2023-07-20 MED ORDER — CIPROFLOXACIN IN D5W 400 MG/200ML IV SOLN
INTRAVENOUS | Status: AC
  Filled 2023-07-20: qty 200

## 2023-07-20 MED ORDER — PROPOFOL 500 MG/50ML IV EMUL
INTRAVENOUS | Status: DC | PRN
  Administered 2023-07-20: 25 ug/kg/min via INTRAVENOUS

## 2023-07-20 MED ORDER — LIDOCAINE HCL (PF) 2 % IJ SOLN
INTRAMUSCULAR | Status: AC
  Filled 2023-07-20: qty 5

## 2023-07-20 SURGICAL SUPPLY — 36 items
ADH SKN CLS APL DERMABOND .7 (GAUZE/BANDAGES/DRESSINGS) ×1
APL PRP STRL LF DISP 70% ISPRP (MISCELLANEOUS) ×1
BLADE CLIPPER SURG (BLADE) IMPLANT
BLADE SURG 15 STRL LF DISP TIS (BLADE) ×1 IMPLANT
BLADE SURG 15 STRL SS (BLADE) ×1
CHLORAPREP W/TINT 26 (MISCELLANEOUS) ×1 IMPLANT
DERMABOND ADVANCED .7 DNX12 (GAUZE/BANDAGES/DRESSINGS) ×1 IMPLANT
DRAPE LAPAROTOMY 77X122 PED (DRAPES) ×1 IMPLANT
ELECT CAUTERY BLADE 6.4 (BLADE) ×1 IMPLANT
ELECT REM PT RETURN 9FT ADLT (ELECTROSURGICAL) ×1
ELECTRODE REM PT RTRN 9FT ADLT (ELECTROSURGICAL) ×1 IMPLANT
GAUZE 4X4 16PLY ~~LOC~~+RFID DBL (SPONGE) ×1 IMPLANT
GLOVE ORTHO TXT STRL SZ7.5 (GLOVE) ×1 IMPLANT
GOWN STRL REUS W/ TWL LRG LVL3 (GOWN DISPOSABLE) ×1 IMPLANT
GOWN STRL REUS W/ TWL XL LVL3 (GOWN DISPOSABLE) ×1 IMPLANT
GOWN STRL REUS W/TWL LRG LVL3 (GOWN DISPOSABLE) ×1
GOWN STRL REUS W/TWL XL LVL3 (GOWN DISPOSABLE) ×1
KIT TURNOVER KIT A (KITS) ×1 IMPLANT
MANIFOLD NEPTUNE II (INSTRUMENTS) ×1 IMPLANT
NDL HYPO 22X1.5 SAFETY MO (MISCELLANEOUS) ×1 IMPLANT
NDL INSUFFLATION 14GA 120MM (NEEDLE) IMPLANT
NEEDLE HYPO 22X1.5 SAFETY MO (MISCELLANEOUS) ×1 IMPLANT
NEEDLE INSUFFLATION 14GA 120MM (NEEDLE) ×1 IMPLANT
NS IRRIG 500ML POUR BTL (IV SOLUTION) ×1 IMPLANT
PACK BASIN MINOR ARMC (MISCELLANEOUS) ×1 IMPLANT
SET TUBE SMOKE EVAC HIGH FLOW (TUBING) IMPLANT
SPIKE FLUID TRANSFER (MISCELLANEOUS) ×1 IMPLANT
SUT ETHIBOND 0 MO6 C/R (SUTURE) ×1 IMPLANT
SUT MNCRL 4-0 (SUTURE) ×1
SUT MNCRL 4-0 27XMFL (SUTURE) ×1
SUT VIC AB 3-0 SH 27 (SUTURE) ×1
SUT VIC AB 3-0 SH 27X BRD (SUTURE) ×1 IMPLANT
SUTURE MNCRL 4-0 27XMF (SUTURE) ×1 IMPLANT
SYR 10ML LL (SYRINGE) ×1 IMPLANT
TRAP FLUID SMOKE EVACUATOR (MISCELLANEOUS) ×1 IMPLANT
WATER STERILE IRR 500ML POUR (IV SOLUTION) ×1 IMPLANT

## 2023-07-20 NOTE — Progress Notes (Signed)
Resending, unavailable at [facility]

## 2023-07-20 NOTE — Transfer of Care (Signed)
Immediate Anesthesia Transfer of Care Note  Patient: Jordan Zimmerman  Procedure(s) Performed: HERNIA REPAIR UMBILICAL ADULT, open  Patient Location: PACU  Anesthesia Type:General  Level of Consciousness: drowsy and patient cooperative  Airway & Oxygen Therapy: Patient Spontanous Breathing and Patient connected to face mask oxygen  Post-op Assessment: Report given to RN and Post -op Vital signs reviewed and stable  Post vital signs: Reviewed and stable  Last Vitals:  Vitals Value Taken Time  BP 124/70 07/20/23 0837  Temp 36.3 C 07/20/23 0837  Pulse 67 07/20/23 0841  Resp 12 07/20/23 0842  SpO2 100 % 07/20/23 0841  Vitals shown include unfiled device data.  Last Pain:  Vitals:   07/20/23 0837  TempSrc:   PainSc: 5          Complications: No notable events documented.

## 2023-07-20 NOTE — Anesthesia Preprocedure Evaluation (Signed)
Anesthesia Evaluation  Patient identified by MRN, date of birth, ID band Patient awake    Reviewed: Allergy & Precautions, H&P , NPO status , Patient's Chart, lab work & pertinent test results  Airway Mallampati: II  TM Distance: >3 FB Neck ROM: full    Dental  (+) Dental Advidsory Given, Poor Dentition,    Pulmonary former smoker   Pulmonary exam normal        Cardiovascular negative cardio ROS Normal cardiovascular exam     Neuro/Psych  Headaches PSYCHIATRIC DISORDERS Anxiety        GI/Hepatic negative GI ROS, Neg liver ROS,,,  Endo/Other  negative endocrine ROS    Renal/GU Renal disease     Musculoskeletal  (+)  Fibromyalgia -  Abdominal Normal abdominal exam  (+)   Peds  Hematology negative hematology ROS (+)   Anesthesia Other Findings Left Ureteral Stone  Past Medical History: No date: Anxiety No date: Chronic fatigue No date: Fibromyalgia No date: History of kidney stones No date: IBS (irritable bowel syndrome) No date: Kidney stones No date: Migraines  Past Surgical History: 2001: CHOLECYSTECTOMY 07/21/2021: COLONOSCOPY; N/A     Comment:  Procedure: COLONOSCOPY;  Surgeon: [name], MD;                Location: [facility];  Service: Gastroenterology;                Laterality: N/A; ~ 2008: CYSTOSCOPY W/ URETEROSCOPY     Comment:  "pulled stone rest of the way out" (05/21/2013) 10/06/2017: EXTRACORPOREAL SHOCK WAVE LITHOTRIPSY; Right     Comment:  Procedure: RIGHT EXTRACORPOREAL SHOCK WAVE LITHOTRIPSY               (ESWL);  Surgeon: Bjorn Pippin, MD;  Location: [facility]S;                Service: Urology;  Laterality: Right; No date: FOOT FRACTURE SURGERY; Bilateral 1992: TONSILLECTOMY  BMI    Body Mass Index: 22.47 kg/m      Reproductive/Obstetrics negative OB ROS                             Anesthesia Physical Anesthesia Plan  ASA: 2  Anesthesia Plan: General  ETT and General   Post-op Pain Management: Ofirmev IV (intra-op)*   Induction: Intravenous  PONV Risk Score and Plan: 3 and Ondansetron, Dexamethasone, Midazolam, TIVA and Propofol infusion  Airway Management Planned: Oral ETT  Additional Equipment:   Intra-op Plan:   Post-operative Plan: Extubation in OR  Informed Consent: I have reviewed the patients History and Physical, chart, labs and discussed the procedure including the risks, benefits and alternatives for the proposed anesthesia with the patient or authorized representative who has indicated his/her understanding and acceptance.     Dental Advisory Given  Plan Discussed with: Anesthesiologist, CRNA and Surgeon  Anesthesia Plan Comments: (Patient consented for risks of anesthesia including but not limited to:  - adverse reactions to medications - damage to eyes, teeth, lips or other oral mucosa - nerve damage due to positioning  - sore throat or hoarseness - Damage to heart, brain, nerves, lungs, other parts of body or loss of life  Patient voiced understanding.)        Anesthesia Quick Evaluation

## 2023-07-20 NOTE — Op Note (Addendum)
Epigastric Hernia Repair, Incarcerated, 1 cm defect size, initial repair.   Pre-operative Diagnosis: Epigastric hernia, non-reducible/incarcerated.  Post-operative Diagnosis: same  Surgeon: Campbell Lerner, MD FACS  Anesthesia: General   Findings: 1 cm fascial defect diameter    Estimated Blood Loss: 5 mL                 Specimens: sac, preperitoneal fat, discarded.         Complications: none              Procedure Details  The patient was seen again in the Holding Room. The benefits, complications, treatment options, and expected outcomes were discussed with the patient. The risks of bleeding, infection, recurrence of symptoms, failure to resolve symptoms, bowel injury, mesh placement, mesh infection, any of which could require further surgery were reviewed with the patient. The likelihood of improving the patient's symptoms with return to their baseline status is good.  The patient and/or family concurred with the proposed plan, giving informed consent.  The patient was taken to Operating Room, identified, and the procedure verified.  A Time Out was held and the above information confirmed.  Prior to the induction of general anesthesia, antibiotic prophylaxis was administered. VTE prophylaxis was in place. General endotracheal anesthesia was then administered and tolerated well. After the induction, the abdomen was prepped with Chloraprep and draped in the sterile fashion. The patient was positioned in the supine position.  Incision was created with a scalpel cephalad to the umbilicus. Electrocautery was used to dissect through subcutaneous tissue, the hernia sac was cephalad, and so the dissection extended cephalad.  I then placed a Veress, to ensure the lone defect.  Insufflation with CO2, and confirmed the absence of additional small defects nearby.  The hernia was measured.  I closed the hernia defect with interrupted 0 Ethibond sutures  in a simple interrupted fashion.  Marcaine  quarter percent with epinephrine and lidocaine 1% was used to inject the site.  Incision was closed  with 4-0 Monocryl. Dermabond was used to coat the skin. Patient tolerated procedure well and there were no immediate complications. Needle and laparotomy counts were correct   Campbell Lerner, M.D., [facility]  07/20/2023 ; 8:39 AM

## 2023-07-20 NOTE — Anesthesia Postprocedure Evaluation (Signed)
Anesthesia Post Note  Patient: Jordan Zimmerman  Procedure(s) Performed: HERNIA REPAIR UMBILICAL ADULT, open  Patient location during evaluation: PACU Anesthesia Type: General Level of consciousness: awake and alert Pain management: pain level controlled Vital Signs Assessment: post-procedure vital signs reviewed and stable Respiratory status: spontaneous breathing, nonlabored ventilation, respiratory function stable and patient connected to nasal cannula oxygen Cardiovascular status: blood pressure returned to baseline and stable Postop Assessment: no apparent nausea or vomiting Anesthetic complications: no   No notable events documented.   Last Vitals:  Vitals:   07/20/23 0900 07/20/23 0915  BP: (!) 94/59 (!) 94/57  Pulse: 66 64  Resp: 17 18  Temp: (!) 36.2 C (!) 36 C  SpO2: 99% 100%    Last Pain:  Vitals:   07/20/23 0915  TempSrc:   PainSc: 4                  Louie Boston

## 2023-07-20 NOTE — Discharge Instructions (Signed)

## 2023-07-20 NOTE — Interval H&P Note (Signed)
History and Physical Interval Note:  07/20/2023 7:24 AM  Jordan Zimmerman  has presented today for surgery, with the diagnosis of incisional hernia incarcerated less 3 cm.  The various methods of treatment have been discussed with the patient and family. After consideration of risks, benefits and other options for treatment, the patient has consented to  Procedure(s): HERNIA REPAIR UMBILICAL ADULT, open (N/A) as a surgical intervention.  The patient's history has been reviewed, patient examined, no change in status, stable for surgery.  I have reviewed the patient's chart and labs.  Questions were answered to the patient's satisfaction.     Campbell Lerner

## 2023-07-21 ENCOUNTER — Encounter: Payer: Self-pay | Admitting: Surgery

## 2023-08-02 ENCOUNTER — Encounter: Payer: Self-pay | Admitting: Physician Assistant

## 2023-08-02 ENCOUNTER — Ambulatory Visit: Payer: BC Managed Care – PPO | Admitting: Physician Assistant

## 2023-08-02 VITALS — BP 119/83 | HR 75 | Temp 98.0°F | Ht 65.0 in | Wt 128.0 lb

## 2023-08-02 DIAGNOSIS — Z09 Encounter for follow-up examination after completed treatment for conditions other than malignant neoplasm: Secondary | ICD-10-CM

## 2023-08-02 DIAGNOSIS — K432 Incisional hernia without obstruction or gangrene: Secondary | ICD-10-CM

## 2023-08-02 DIAGNOSIS — K436 Other and unspecified ventral hernia with obstruction, without gangrene: Secondary | ICD-10-CM

## 2023-08-02 NOTE — Progress Notes (Signed)
[facility] POST-OP OFFICE VISIT  08/02/2023  HPI: Jordan Zimmerman is a 52 y.o. female 13 days s/p repair of epigastric hernia (1 cm) with Dr Claudine Mouton   She is doing well Pain for about 7 days and then turned a corned Now only needing one tylenol or ibuprofen daily No fever, chills, emesis Nausea at baseline is unchanged No issues with incision No other complaints   Vital signs: Ht 5\' 5"  (1.651 m)   LMP 07/03/2023 (Exact Date)   BMI 21.30 kg/m    Physical Exam: Constitutional: Well appearing female, NAD Abdomen: Soft, non-tender, non-distended, no rebound/guarding Skin: Umbilical incision is healing well, no erythema or drainage   Assessment/Plan: This is a 52 y.o. female 13 days s/p repair of epigastric hernia (1 cm) with Dr Claudine Mouton    - Pain control prn  - Reviewed wound care recommendation  - Reviewed lifting restrictions; 4-6 weeks total  - She can follow up on as needed basis; She understands to call with questions/concerns  -- Jordan Oxford, PA-C [facility] 08/02/2023, 2:06 PM M-F: 7am - 4pm

## 2023-08-02 NOTE — Patient Instructions (Signed)

## 2023-08-12 ENCOUNTER — Other Ambulatory Visit: Payer: Self-pay | Admitting: Family Medicine

## 2023-08-12 DIAGNOSIS — N946 Dysmenorrhea, unspecified: Secondary | ICD-10-CM

## 2023-08-15 ENCOUNTER — Ambulatory Visit: Payer: BC Managed Care – PPO | Admitting: Family Medicine

## 2023-08-25 ENCOUNTER — Other Ambulatory Visit: Payer: Self-pay | Admitting: Family Medicine

## 2023-08-25 MED ORDER — ONDANSETRON HCL 4 MG PO TABS: 4.0000 mg | ORAL_TABLET | Freq: Three times a day (TID) | ORAL | 0 refills | Status: DC | PRN

## 2023-09-07 ENCOUNTER — Ambulatory Visit: Payer: BC Managed Care – PPO | Admitting: Psychiatry

## 2023-09-16 ENCOUNTER — Encounter: Payer: Self-pay | Admitting: Family Medicine

## 2023-09-28 ENCOUNTER — Ambulatory Visit: Payer: BC Managed Care – PPO | Admitting: Physician Assistant

## 2023-09-28 ENCOUNTER — Encounter: Payer: Self-pay | Admitting: Physician Assistant

## 2023-09-28 VITALS — BP 121/76 | HR 59 | Ht 65.0 in | Wt 130.0 lb

## 2023-09-28 DIAGNOSIS — N3281 Overactive bladder: Secondary | ICD-10-CM | POA: Diagnosis not present

## 2023-09-28 MED ORDER — GEMTESA 75 MG PO TABS: 75.0000 mg | ORAL_TABLET | Freq: Every day | ORAL | Status: DC

## 2023-09-28 NOTE — Progress Notes (Signed)
09/28/2023 2:59 PM   Jordan Zimmerman Jordan Zimmerman April 29, 1971 [id number]  CC: Chief Complaint  Patient presents with   Follow-up    follow-up   HPI: Jordan Zimmerman is a 52 y.o. female with PMH nephrolithiasis, OAB wet, and left renal atrophy who presents today for symptom recheck on Myrbetriq 50 mg.   Today she reports she stopped Myrbetriq after about a month.  She had been having some nausea of unclear etiology but thought the Myrbetriq could be contributory.  Her nausea improved after she stopped it, but has come back within the past several weeks.  She continues to experience daytime frequency about every 2 hours and urge incontinence.  She has nocturia x 1 and denies stress incontinence.  She has a history of IBS and is frequently constipated.  PMH: Past Medical History:  Diagnosis Date   Anxiety    Chronic fatigue    Chronic low back pain with bilateral sciatica    Depression    Endometriosis    Fibromyalgia    History of kidney stones    IBS (irritable bowel syndrome)    Kidney stones    Migraines    PTSD (post-traumatic stress disorder)    Syncope     Surgical History: Past Surgical History:  Procedure Laterality Date   CHOLECYSTECTOMY  2001   COLONOSCOPY N/A 07/21/2021   Procedure: COLONOSCOPY;  Surgeon: [name], MD;  Location: [facility] ENDOSCOPY;  Service: Gastroenterology;  Laterality: N/A;   CYSTOSCOPY W/ URETEROSCOPY  ~ 2008   "pulled stone rest of the way out" (05/21/2013)   CYSTOSCOPY/URETEROSCOPY/HOLMIUM LASER/STENT PLACEMENT Left 03/28/2023   Procedure: CYSTOSCOPY/URETEROSCOPY/HOLMIUM LASER/STENT PLACEMENT;  Surgeon: Vanna Scotland, MD;  Location: [facility] ORS;  Service: Urology;  Laterality: Left;   EXTRACORPOREAL SHOCK WAVE LITHOTRIPSY Right 10/06/2017   Procedure: RIGHT EXTRACORPOREAL SHOCK WAVE LITHOTRIPSY (ESWL);  Surgeon: Bjorn Pippin, MD;  Location: [facility] ORS;  Service: Urology;  Laterality: Right;   FOOT FRACTURE SURGERY Bilateral    TONSILLECTOMY  1992   UMBILICAL  HERNIA REPAIR N/A 07/20/2023   Procedure: HERNIA REPAIR UMBILICAL ADULT, open;  Surgeon: Campbell Lerner, MD;  Location: [facility] ORS;  Service: General;  Laterality: N/A;    Home Medications:  Allergies as of 09/28/2023       Reactions   Ambien [zolpidem Tartrate] Anaphylaxis   Amoxicillin Hives, Swelling   Fever.    Codeine Other (See Comments)   migraines   Penicillins Hives, Other (See Comments)   High fever        Medication List        Accurate as of September 28, 2023  2:59 PM. If you have any questions, ask your nurse or doctor.          acetaminophen 500 MG tablet Commonly known as: TYLENOL Take 1,000 mg by mouth every 6 (six) hours as needed.   Ajovy 225 MG/1.5ML Soaj Generic drug: Fremanezumab-vfrm Inject 225 mg into the skin every 30 (thirty) days.   levonorgestrel 20 MCG/DAY Iud Commonly known as: MIRENA 1 each by Intrauterine route once.   ondansetron 4 MG tablet Commonly known as: Zofran Take 1 tablet (4 mg total) by mouth every 8 (eight) hours as needed for nausea or vomiting.   propranolol ER 60 MG 24 hr capsule Commonly known as: INDERAL LA TAKE 1 CAPSULE BY MOUTH EVERY DAY   rizatriptan 10 MG disintegrating tablet Commonly known as: MAXALT-MLT Take 1 tablet (10 mg total) by mouth as needed for migraine. May repeat in 2 hours if needed  Allergies:  Allergies  Allergen Reactions   Ambien [Zolpidem Tartrate] Anaphylaxis   Amoxicillin Hives and Swelling    Fever.    Codeine Other (See Comments)    migraines   Penicillins Hives and Other (See Comments)    High fever    Family History: Family History  Problem Relation Age of Onset   Diabetes Mother    Hypertension Father    Diabetes Father    Prostate cancer Father    Other Brother        Disk Disease   Breast cancer Maternal Grandmother    Breast cancer Maternal Aunt 31    Social History:   reports that she quit smoking about 28 years ago. Her smoking use included  cigarettes. She started smoking about 32 years ago. She has a 4 pack-year smoking history. She has been exposed to tobacco smoke. She has never used smokeless tobacco. She reports current drug use. Drug: Marijuana. She reports that she does not drink alcohol.  Physical Exam: BP 121/76   Pulse (!) 59   Ht 5\' 5"  (1.651 m)   Wt 130 lb (59 kg)   BMI 21.63 kg/m   Constitutional:  Alert and oriented, no acute distress, nontoxic appearing HEENT: [place], AT Cardiovascular: No clubbing, cyanosis, or edema Respiratory: Normal respiratory effort, no increased work of breathing Skin: No rashes, bruises or suspicious lesions Neurologic: Grossly intact, no focal deficits, moving all 4 extremities Psychiatric: Normal mood and affect  Assessment & Plan:   1. OAB (overactive bladder) Stopped Myrbetriq due to possible contribution to nausea of unclear etiology.  Anticholinergics are contraindicated with her history of frequent constipation.  I offered her Gemtesa samples as an alternative and we will see her back in 6 weeks for symptom recheck and PVR.  If she fails pharmacotherapy, we discussed third line therapies including PTNS, intravesical Botox, and InterStim.  Return for 6 week symptom recheck with PVR.  Carman Ching, PA-C  [facility] [place] [address], [address] [place], [place] [address] [phone]

## 2023-10-03 ENCOUNTER — Other Ambulatory Visit: Payer: Self-pay | Admitting: *Deleted

## 2023-10-03 MED ORDER — ONDANSETRON HCL 4 MG PO TABS: 4.0000 mg | ORAL_TABLET | Freq: Three times a day (TID) | ORAL | 1 refills | Status: DC | PRN

## 2023-10-03 NOTE — Telephone Encounter (Signed)
Last seen on 07/04/23 Follow up scheduled on 01/17/24

## 2023-10-26 ENCOUNTER — Encounter: Payer: Self-pay | Admitting: Family Medicine

## 2023-10-31 ENCOUNTER — Ambulatory Visit (INDEPENDENT_AMBULATORY_CARE_PROVIDER_SITE_OTHER): Payer: BC Managed Care – PPO | Admitting: Family Medicine

## 2023-10-31 ENCOUNTER — Encounter: Payer: Self-pay | Admitting: Family Medicine

## 2023-10-31 ENCOUNTER — Other Ambulatory Visit (HOSPITAL_COMMUNITY)
Admission: RE | Admit: 2023-10-31 | Discharge: 2023-10-31 | Disposition: A | Discharge status: Home / Self Care | Payer: BC Managed Care – PPO | Source: Ambulatory Visit | Attending: Family Medicine | Admitting: Family Medicine

## 2023-10-31 VITALS — BP 118/78 | HR 59 | Wt 129.0 lb

## 2023-10-31 DIAGNOSIS — N898 Other specified noninflammatory disorders of vagina: Secondary | ICD-10-CM | POA: Diagnosis not present

## 2023-10-31 DIAGNOSIS — L989 Disorder of the skin and subcutaneous tissue, unspecified: Secondary | ICD-10-CM

## 2023-10-31 NOTE — Progress Notes (Unsigned)
   GYNECOLOGY PROBLEM  VISIT ENCOUNTER NOTE  Subjective:   Jordan Zimmerman is a 52 y.o. G27P2000 female here for a problem GYN visit.  Current complaints:   Painful area-- located upper left labia/near clitoris. Reports noted several weeks ago when side sleeping. Reports worsening. Notes a hard area under the painful area  Vaginal odor- present since IUD insertion. Reports bleeding and cramping is better  Denies abnormal vaginal bleeding, discharge, pelvic pain, problems with intercourse or other gynecologic concerns.    Gynecologic History No LMP recorded. (Menstrual status: IUD).  Contraception: IUD  Health Maintenance Due  Topic Date Due   INFLUENZA VACCINE  Never done   COVID-19 Vaccine (1 - 2023-24 season) Never done    The following portions of the patient's history were reviewed and updated as appropriate: allergies, current medications, past family history, past medical history, past social history, past surgical history and problem list.  Review of Systems Pertinent items are noted in HPI.   Objective:  BP 118/78   Pulse (!) 59   Wt 129 lb (58.5 kg)   BMI 21.47 kg/m  Gen: well appearing, NAD HEENT: no scleral icterus CV: RR Lung: Normal WOB Ext: warm well perfused  PELVIC: Normal appearing external genitalia; normal appearing vaginal mucosa and cervix.  No abnormal discharge noted.   Normal uterine size, no other palpable masses, no uterine or adnexal tenderness. Rice sized nodule that is painful the touch on the    Assessment and Plan:  1. Vaginal odor - Cervicovaginal ancillary only  2. Painful skin lesion - Herpes simplex virus culture   Please refer to After Visit Summary for other counseling recommendations.   No follow-ups on file.  Federico Flake, MD, MPH, ABFM Attending Physician Faculty Practice- [facility]

## 2023-10-31 NOTE — Progress Notes (Unsigned)
RGYN pt here for a problem visit today.  CC: pt states she has a very painful area in vagina it hurts to walk, sit and even clothes irritate the area , pt tired a warm bath still no relief pt feels area has gotten bigger.   No fever, no bleeding, no puss has came from the area.

## 2023-11-02 LAB — CERVICOVAGINAL ANCILLARY ONLY
Bacterial Vaginitis (gardnerella): POSITIVE — AB
Candida Glabrata: NEGATIVE
Candida Vaginitis: NEGATIVE
Chlamydia: NEGATIVE
Comment: NEGATIVE
Comment: NEGATIVE
Comment: NEGATIVE
Comment: NEGATIVE
Comment: NEGATIVE
Comment: NORMAL
Neisseria Gonorrhea: NEGATIVE
Trichomonas: NEGATIVE

## 2023-11-03 ENCOUNTER — Encounter: Payer: Self-pay | Admitting: Family Medicine

## 2023-11-03 ENCOUNTER — Other Ambulatory Visit: Payer: Self-pay | Admitting: *Deleted

## 2023-11-03 LAB — HERPES SIMPLEX VIRUS CULTURE

## 2023-11-03 MED ORDER — METRONIDAZOLE 500 MG PO TABS: 500.0000 mg | ORAL_TABLET | Freq: Two times a day (BID) | ORAL | 0 refills | Status: AC

## 2023-11-07 ENCOUNTER — Encounter: Payer: Self-pay | Admitting: Family Medicine

## 2023-11-08 ENCOUNTER — Encounter: Payer: Self-pay | Admitting: Urology

## 2023-11-08 DIAGNOSIS — N3281 Overactive bladder: Secondary | ICD-10-CM

## 2023-11-09 ENCOUNTER — Ambulatory Visit: Payer: BC Managed Care – PPO | Admitting: Physician Assistant

## 2023-11-09 MED ORDER — GEMTESA 75 MG PO TABS
75.0000 mg | ORAL_TABLET | Freq: Every day | ORAL | 11 refills | Status: AC
Start: 2023-11-09 — End: ?

## 2023-11-11 DIAGNOSIS — F5082 Avoidant/restrictive food intake disorder: Secondary | ICD-10-CM | POA: Diagnosis not present

## 2023-11-11 DIAGNOSIS — F411 Generalized anxiety disorder: Secondary | ICD-10-CM | POA: Diagnosis not present

## 2023-11-11 DIAGNOSIS — F331 Major depressive disorder, recurrent, moderate: Secondary | ICD-10-CM | POA: Diagnosis not present

## 2023-11-11 DIAGNOSIS — F4310 Post-traumatic stress disorder, unspecified: Secondary | ICD-10-CM | POA: Diagnosis not present

## 2023-11-12 ENCOUNTER — Encounter: Payer: Self-pay | Admitting: Family

## 2023-11-17 ENCOUNTER — Other Ambulatory Visit: Payer: Self-pay

## 2023-11-17 ENCOUNTER — Telehealth: Payer: Self-pay

## 2023-11-17 MED ORDER — ONDANSETRON HCL 4 MG PO TABS: 4.0000 mg | ORAL_TABLET | Freq: Three times a day (TID) | ORAL | 0 refills | Status: DC | PRN

## 2023-11-17 NOTE — Telephone Encounter (Signed)
PA for [facility] submitted via covermymeds. PA approved. Patient advised via [employer]. Pharmacy advised. Authorization Expiration Date: 11/16/2025

## 2023-11-18 DIAGNOSIS — F331 Major depressive disorder, recurrent, moderate: Secondary | ICD-10-CM | POA: Diagnosis not present

## 2023-11-18 DIAGNOSIS — F4310 Post-traumatic stress disorder, unspecified: Secondary | ICD-10-CM | POA: Diagnosis not present

## 2023-11-18 DIAGNOSIS — F5082 Avoidant/restrictive food intake disorder: Secondary | ICD-10-CM | POA: Diagnosis not present

## 2023-11-18 DIAGNOSIS — F411 Generalized anxiety disorder: Secondary | ICD-10-CM | POA: Diagnosis not present

## 2023-11-24 ENCOUNTER — Encounter: Payer: Self-pay | Admitting: Family

## 2023-11-24 ENCOUNTER — Ambulatory Visit: Payer: BC Managed Care – PPO | Admitting: Family

## 2023-11-24 VITALS — BP 116/66 | HR 69 | Temp 97.7°F | Ht 65.0 in | Wt 129.0 lb

## 2023-11-24 DIAGNOSIS — R198 Other specified symptoms and signs involving the digestive system and abdomen: Secondary | ICD-10-CM | POA: Insufficient documentation

## 2023-11-24 DIAGNOSIS — G8929 Other chronic pain: Secondary | ICD-10-CM

## 2023-11-24 DIAGNOSIS — I739 Peripheral vascular disease, unspecified: Secondary | ICD-10-CM | POA: Diagnosis not present

## 2023-11-24 DIAGNOSIS — E7800 Pure hypercholesterolemia, unspecified: Secondary | ICD-10-CM | POA: Diagnosis not present

## 2023-11-24 DIAGNOSIS — M25571 Pain in right ankle and joints of right foot: Secondary | ICD-10-CM

## 2023-11-24 DIAGNOSIS — M25572 Pain in left ankle and joints of left foot: Secondary | ICD-10-CM

## 2023-11-24 DIAGNOSIS — R1013 Epigastric pain: Secondary | ICD-10-CM | POA: Insufficient documentation

## 2023-11-24 MED ORDER — OMEPRAZOLE 20 MG PO CPDR: 20.0000 mg | DELAYED_RELEASE_CAPSULE | Freq: Every day | ORAL | 3 refills | Status: DC

## 2023-11-24 NOTE — Addendum Note (Signed)
Addended by: Alvina Chou on: 11/24/2023 02:33 PM   Modules accepted: Orders

## 2023-11-24 NOTE — Assessment & Plan Note (Addendum)
No edema seen on physical exam. Suspect arthritic from history of bil ankle fracture with hardware However also with intermittent claudification so will order ABI as this has been ongoing.  For swelling sensation at night time, advised low sodium diet as well , compression stocks prn

## 2023-11-24 NOTE — Patient Instructions (Signed)
  I have ordered imaging for you at [facility] outpatient diagnostic center for an AAA screening ultrasound. This order has been sent over for you electronically.  Please call [phone] to schedule this appointment.  I have also ordered an ABI ultrasound but this is not labeled as stat as less urgent, so you can schedule this one at your leisure.

## 2023-11-24 NOTE — Assessment & Plan Note (Signed)
Trial omeprazole for suspected silent heartburn

## 2023-11-24 NOTE — Progress Notes (Signed)
Established Patient Office Visit  Subjective:      CC:  Chief Complaint  Patient presents with   Abdominal Pain    C/o abd pain- in epigastric region. Started yesterday. Also, c/o B ankle pain/swelling off and on for past few wks.     HPI: Jordan Zimmerman is a 52 y.o. female presenting on 11/24/2023 for Abdominal Pain (C/o abd pain- in epigastric region. Started yesterday. Also, c/o B ankle pain/swelling off and on for past few wks. ) . Two days ago started with worsening epigastric pain to the point of having to be in 'a ball' curled up due to the pain, of which she states has lessened up. Was a ten then, but now about a one at this time. Feels sore when she touches the area, which she states is right above the belly button. Did have a h/o umbilcal hernia repair, back in August.  Denies heartburn. Bowel movements usually normal however started new medication gemtesa causes alternative constipation and diarrhea.   She was worried because she also feels a pulsing sensation in her stomach at times, that lasts for several seconds and will be very strong.  Se was a smoker, quit 30 years ago, smoked for total of about 6 years.   Intermittent ankle swelling, has noticed it more at night time while she is asleep because she wakes up with them swollen and they will be really achy. She has tried compression stockings with mild improvement. She does have a good amount of salt intake in her diet. Does not sit or stand too much during the day. When she walks she does feel intermittent claudification in lower ext and her ankles, will feel weak at times. She has broken her ankles about five years ago. She does have fixation hardware left ankle.     Social history:  Relevant past medical, surgical, family and social history reviewed and updated as indicated. Interim medical history since our last visit reviewed.  Allergies and medications reviewed and updated.  DATA REVIEWED: CHART IN  EPIC     ROS: Negative unless specifically indicated above in HPI.    Current Outpatient Medications:    acetaminophen (TYLENOL) 500 MG tablet, Take 1,000 mg by mouth every 6 (six) hours as needed., Disp: , Rfl:    Fremanezumab-vfrm (AJOVY) 225 MG/1.5ML SOAJ, Inject 225 mg into the skin every 30 (thirty) days., Disp: 4.5 mL, Rfl: 3   levonorgestrel (MIRENA) 20 MCG/DAY IUD, 1 each by Intrauterine route once., Disp: , Rfl:    omeprazole (PRILOSEC) 20 MG capsule, Take 1 capsule (20 mg total) by mouth daily., Disp: 30 capsule, Rfl: 3   ondansetron (ZOFRAN) 4 MG tablet, Take 1 tablet (4 mg total) by mouth every 8 (eight) hours as needed for nausea or vomiting., Disp: 20 tablet, Rfl: 0   propranolol ER (INDERAL LA) 60 MG 24 hr capsule, TAKE 1 CAPSULE BY MOUTH EVERY DAY, Disp: 90 capsule, Rfl: 2   rizatriptan (MAXALT-MLT) 10 MG disintegrating tablet, Take 1 tablet (10 mg total) by mouth as needed for migraine. May repeat in 2 hours if needed, Disp: 9 tablet, Rfl: 11   Vibegron (GEMTESA) 75 MG TABS, Take 1 tablet (75 mg total) by mouth daily., Disp: 30 tablet, Rfl: 11      Objective:    BP 116/66   Pulse 69   Temp 97.7 F (36.5 C) (Oral)   Ht 5\' 5"  (1.651 m)   Wt 129 lb (58.5 kg)   SpO2 99%  BMI 21.47 kg/m   Wt Readings from Last 3 Encounters:  11/24/23 129 lb (58.5 kg)  10/31/23 129 lb (58.5 kg)  09/28/23 130 lb (59 kg)    Physical Exam Constitutional:      General: She is not in acute distress.    Appearance: Normal appearance. She is normal weight. She is not ill-appearing, toxic-appearing or diaphoretic.  HENT:     Head: Normocephalic.  Cardiovascular:     Rate and Rhythm: Normal rate and regular rhythm.     Pulses:          Popliteal pulses are 2+ on the right side and 2+ on the left side.       Dorsalis pedis pulses are 2+ on the right side and 2+ on the left side.       Posterior tibial pulses are 2+ on the right side and 2+ on the left side.     Heart sounds: Normal  heart sounds.  Pulmonary:     Effort: Pulmonary effort is normal.  Abdominal:     General: Bowel sounds are normal.     Palpations: Abdomen is soft. There is pulsatile mass.     Tenderness: There is abdominal tenderness in the epigastric area and periumbilical area. There is no guarding or rebound.  Musculoskeletal:        General: Normal range of motion.     Right lower leg: No edema.     Left lower leg: No edema.  Neurological:     General: No focal deficit present.     Mental Status: She is alert and oriented to person, place, and time. Mental status is at baseline.  Psychiatric:        Mood and Affect: Mood normal.        Behavior: Behavior normal.        Thought Content: Thought content normal.        Judgment: Judgment normal.           Assessment & Plan:  Elevated LDL cholesterol level -     Lipid panel  High magnesium levels -     Magnesium  Epigastric pain Assessment & Plan: Trial omeprazole for suspected silent heartburn  Orders: -     Comprehensive metabolic panel -     CBC -     Omeprazole; Take 1 capsule (20 mg total) by mouth daily.  Dispense: 30 capsule; Refill: 3  Intermittent claudication (HCC) -     US ARTERIAL ABI (SCREENING LOWER EXTREMITY); Future  Pulsatile abdomen Assessment & Plan: Low suspicion AAA however will order u/s aorta to rule out Red flag symptoms d/w pt  If negative will consider CT abd pelvis to r/o hernia.    Orders: -     US AORTA DUPLEX COMPLETE; Future  Chronic pain of both ankles Assessment & Plan: No edema seen on physical exam. Suspect arthritic from history of bil ankle fracture with hardware However also with intermittent claudification so will order ABI as this has been ongoing.  For swelling sensation at night time, advised low sodium diet as well , compression stocks prn       Return in about 3 weeks (around 12/15/2023) for abdominal pain.  Mort Sawyers, MSN, APRN, FNP-C [facility]

## 2023-11-24 NOTE — Assessment & Plan Note (Signed)
Low suspicion AAA however will order u/s aorta to rule out Red flag symptoms d/w pt  If negative will consider CT abd pelvis to r/o hernia.

## 2023-11-25 ENCOUNTER — Encounter: Payer: Self-pay | Admitting: Family

## 2023-11-25 ENCOUNTER — Other Ambulatory Visit (INDEPENDENT_AMBULATORY_CARE_PROVIDER_SITE_OTHER): Payer: BC Managed Care – PPO

## 2023-11-25 ENCOUNTER — Ambulatory Visit
Admission: RE | Admit: 2023-11-25 | Discharge: 2023-11-25 | Disposition: A | Discharge status: Home / Self Care | Payer: BC Managed Care – PPO | Source: Ambulatory Visit | Attending: Family | Admitting: Family

## 2023-11-25 DIAGNOSIS — R109 Unspecified abdominal pain: Secondary | ICD-10-CM | POA: Diagnosis not present

## 2023-11-25 DIAGNOSIS — R1013 Epigastric pain: Secondary | ICD-10-CM

## 2023-11-25 DIAGNOSIS — R1033 Periumbilical pain: Secondary | ICD-10-CM

## 2023-11-25 DIAGNOSIS — R198 Other specified symptoms and signs involving the digestive system and abdomen: Secondary | ICD-10-CM | POA: Diagnosis not present

## 2023-11-25 DIAGNOSIS — I739 Peripheral vascular disease, unspecified: Secondary | ICD-10-CM | POA: Diagnosis not present

## 2023-11-25 DIAGNOSIS — E7800 Pure hypercholesterolemia, unspecified: Secondary | ICD-10-CM

## 2023-11-25 LAB — COMPREHENSIVE METABOLIC PANEL
ALT: 7 U/L (ref 0–35)
AST: 15 U/L (ref 0–37)
Albumin: 4.2 g/dL (ref 3.5–5.2)
Alkaline Phosphatase: 57 U/L (ref 39–117)
BUN: 12 mg/dL (ref 6–23)
CO2: 27 meq/L (ref 19–32)
Calcium: 9.1 mg/dL (ref 8.4–10.5)
Chloride: 110 meq/L (ref 96–112)
Creatinine, Ser: 0.98 mg/dL (ref 0.40–1.20)
GFR: 67.54 mL/min (ref >60.00)
Glucose, Bld: 122 mg/dL — ABNORMAL HIGH (ref 70–99)
Potassium: 3.8 meq/L (ref 3.5–5.1)
Sodium: 144 meq/L (ref 135–145)
Total Bilirubin: 0.4 mg/dL (ref 0.2–1.2)
Total Protein: 6.1 g/dL (ref 6.0–8.3)

## 2023-11-25 LAB — CBC
HCT: 40.5 % (ref 36.0–46.0)
Hemoglobin: 13.4 g/dL (ref 12.0–15.0)
MCHC: 33.2 g/dL (ref 30.0–36.0)
MCV: 97.1 fL (ref 78.0–100.0)
Platelets: 276 10*3/uL (ref 150.0–400.0)
RBC: 4.17 Mil/uL (ref 3.87–5.11)
RDW: 14.1 % (ref 11.5–15.5)
WBC: 9.2 10*3/uL (ref 4.0–10.5)

## 2023-11-25 LAB — LIPID PANEL
Cholesterol: 173 mg/dL (ref 0–200)
HDL: 56.7 mg/dL (ref >39.00)
LDL Cholesterol: 100 mg/dL — ABNORMAL HIGH (ref 0–99)
NonHDL: 116.2
Total CHOL/HDL Ratio: 3
Triglycerides: 79 mg/dL (ref 0.0–149.0)
VLDL: 15.8 mg/dL (ref 0.0–40.0)

## 2023-11-25 LAB — MAGNESIUM: Magnesium: 2 mg/dL (ref 1.5–2.5)

## 2023-11-28 ENCOUNTER — Ambulatory Visit (INDEPENDENT_AMBULATORY_CARE_PROVIDER_SITE_OTHER): Payer: BC Managed Care – PPO

## 2023-11-28 ENCOUNTER — Other Ambulatory Visit: Payer: Self-pay | Admitting: Family

## 2023-11-28 DIAGNOSIS — R739 Hyperglycemia, unspecified: Secondary | ICD-10-CM | POA: Diagnosis not present

## 2023-11-28 LAB — HEMOGLOBIN A1C: Hgb A1c MFr Bld: 5.6 % (ref 4.6–6.5)

## 2023-11-28 NOTE — Addendum Note (Signed)
Addended by: Mort Sawyers on: 11/28/2023 03:12 PM   Modules accepted: Orders

## 2023-11-29 ENCOUNTER — Encounter: Payer: Self-pay | Admitting: Radiology

## 2023-11-29 ENCOUNTER — Ambulatory Visit
Admission: RE | Admit: 2023-11-29 | Discharge: 2023-11-29 | Disposition: A | Discharge status: Home / Self Care | Payer: BC Managed Care – PPO | Source: Ambulatory Visit | Attending: Family | Admitting: Family

## 2023-11-29 DIAGNOSIS — K5900 Constipation, unspecified: Secondary | ICD-10-CM | POA: Diagnosis not present

## 2023-11-29 DIAGNOSIS — K838 Other specified diseases of biliary tract: Secondary | ICD-10-CM | POA: Diagnosis not present

## 2023-11-29 DIAGNOSIS — R1013 Epigastric pain: Secondary | ICD-10-CM | POA: Insufficient documentation

## 2023-11-29 DIAGNOSIS — N132 Hydronephrosis with renal and ureteral calculous obstruction: Secondary | ICD-10-CM | POA: Diagnosis not present

## 2023-11-29 DIAGNOSIS — R1033 Periumbilical pain: Secondary | ICD-10-CM | POA: Diagnosis not present

## 2023-11-29 DIAGNOSIS — N134 Hydroureter: Secondary | ICD-10-CM | POA: Diagnosis not present

## 2023-11-29 MED ORDER — IOHEXOL 300 MG/ML  SOLN
80.0000 mL | Freq: Once | INTRAMUSCULAR | Status: AC | PRN
Start: 2023-11-29 — End: 2023-11-29
  Administered 2023-11-29: 80 mL via INTRAVENOUS

## 2023-11-29 NOTE — Telephone Encounter (Signed)
 Appears note has been sent by my chart to pt; sending to [facility] pool.

## 2023-12-02 ENCOUNTER — Encounter: Payer: Self-pay | Admitting: Family

## 2023-12-02 DIAGNOSIS — F411 Generalized anxiety disorder: Secondary | ICD-10-CM | POA: Diagnosis not present

## 2023-12-02 DIAGNOSIS — F4310 Post-traumatic stress disorder, unspecified: Secondary | ICD-10-CM | POA: Diagnosis not present

## 2023-12-02 DIAGNOSIS — F5082 Avoidant/restrictive food intake disorder: Secondary | ICD-10-CM | POA: Diagnosis not present

## 2023-12-02 DIAGNOSIS — F331 Major depressive disorder, recurrent, moderate: Secondary | ICD-10-CM | POA: Diagnosis not present

## 2023-12-02 NOTE — Progress Notes (Signed)
 Constipation is seen on the Ct. Are you taking miralax and has this been helpful if you are?   There was a suggestion of some possible bile duct dilation that could have been from the gallbladder surgery however if it had a stone already in the bile duct this could still be present and when you felt the horrible pain you might have passed this. Liver function tests are reassuring as they are not elevated however we should get you to GI rather quickly to investigate further. Is the pain still ongoing? Red flag symptoms of when to go to ER would be if the pain becomes intense, and or if you start noticing your skin is yellow and or you have a fever.   There was also some findings on the bladder with possible bladder wall thickening. You do see urology so just follow up with them for this. They likely already know this is present.

## 2023-12-04 ENCOUNTER — Encounter: Payer: Self-pay | Admitting: Family Medicine

## 2023-12-05 ENCOUNTER — Other Ambulatory Visit: Payer: Self-pay

## 2023-12-05 ENCOUNTER — Encounter: Payer: Self-pay | Admitting: Family

## 2023-12-05 DIAGNOSIS — K838 Other specified diseases of biliary tract: Secondary | ICD-10-CM

## 2023-12-05 DIAGNOSIS — Z9049 Acquired absence of other specified parts of digestive tract: Secondary | ICD-10-CM

## 2023-12-05 MED ORDER — PROPRANOLOL HCL ER 60 MG PO CP24: 60.0000 mg | ORAL_CAPSULE | Freq: Every day | ORAL | 0 refills | Status: DC

## 2023-12-08 ENCOUNTER — Other Ambulatory Visit: Payer: Self-pay

## 2023-12-08 MED ORDER — ONDANSETRON HCL 4 MG PO TABS: 4.0000 mg | ORAL_TABLET | Freq: Three times a day (TID) | ORAL | 0 refills | Status: DC | PRN

## 2023-12-08 NOTE — Telephone Encounter (Signed)
 Please call pt we are going to order an abdominal ultrasound. There could possible be a stricture and or gallstone in the bile duct even though gallbladder has been taken out.   Ordered stat abdominal ultrasound.   I have ordered imaging for you at [facility] outpatient diagnostic center. This order has been sent over for you electronically. I have ordered this stat.   Please call [phone] to schedule this appointment.

## 2023-12-08 NOTE — Telephone Encounter (Signed)
 LM for pt to return call. Sending her this information in a MyChart message as well.

## 2023-12-09 ENCOUNTER — Encounter: Payer: Self-pay | Admitting: Family

## 2023-12-09 ENCOUNTER — Encounter: Payer: Self-pay | Admitting: Urology

## 2023-12-09 ENCOUNTER — Ambulatory Visit
Admission: RE | Admit: 2023-12-09 | Discharge: 2023-12-09 | Disposition: A | Discharge status: Home / Self Care | Payer: BC Managed Care – PPO | Source: Ambulatory Visit | Attending: Family | Admitting: Family

## 2023-12-09 DIAGNOSIS — Z9049 Acquired absence of other specified parts of digestive tract: Secondary | ICD-10-CM | POA: Diagnosis not present

## 2023-12-09 DIAGNOSIS — F411 Generalized anxiety disorder: Secondary | ICD-10-CM | POA: Diagnosis not present

## 2023-12-09 DIAGNOSIS — N3281 Overactive bladder: Secondary | ICD-10-CM

## 2023-12-09 DIAGNOSIS — N132 Hydronephrosis with renal and ureteral calculous obstruction: Secondary | ICD-10-CM | POA: Diagnosis not present

## 2023-12-09 DIAGNOSIS — N200 Calculus of kidney: Secondary | ICD-10-CM

## 2023-12-09 DIAGNOSIS — N2889 Other specified disorders of kidney and ureter: Secondary | ICD-10-CM

## 2023-12-09 DIAGNOSIS — F331 Major depressive disorder, recurrent, moderate: Secondary | ICD-10-CM | POA: Diagnosis not present

## 2023-12-09 DIAGNOSIS — K838 Other specified diseases of biliary tract: Secondary | ICD-10-CM | POA: Insufficient documentation

## 2023-12-09 DIAGNOSIS — R109 Unspecified abdominal pain: Secondary | ICD-10-CM | POA: Diagnosis not present

## 2023-12-09 DIAGNOSIS — N3289 Other specified disorders of bladder: Secondary | ICD-10-CM

## 2023-12-09 DIAGNOSIS — F5082 Avoidant/restrictive food intake disorder: Secondary | ICD-10-CM | POA: Diagnosis not present

## 2023-12-09 DIAGNOSIS — F4310 Post-traumatic stress disorder, unspecified: Secondary | ICD-10-CM | POA: Diagnosis not present

## 2023-12-09 NOTE — Progress Notes (Signed)
 Pt has some abdominal symptoms that I can't quite seem to put a finger on however recent u/s does suggest pelviectasis mild on right side, curious if this could contributing to her abdominal pain?

## 2023-12-13 DIAGNOSIS — N2889 Other specified disorders of kidney and ureter: Secondary | ICD-10-CM | POA: Insufficient documentation

## 2023-12-13 NOTE — Addendum Note (Signed)
 Addended by: Mort Sawyers on: 12/13/2023 03:06 PM   Modules accepted: Orders

## 2023-12-16 DIAGNOSIS — F331 Major depressive disorder, recurrent, moderate: Secondary | ICD-10-CM | POA: Diagnosis not present

## 2023-12-16 DIAGNOSIS — F5082 Avoidant/restrictive food intake disorder: Secondary | ICD-10-CM | POA: Diagnosis not present

## 2023-12-16 DIAGNOSIS — F4310 Post-traumatic stress disorder, unspecified: Secondary | ICD-10-CM | POA: Diagnosis not present

## 2023-12-16 DIAGNOSIS — F411 Generalized anxiety disorder: Secondary | ICD-10-CM | POA: Diagnosis not present

## 2023-12-20 NOTE — Progress Notes (Unsigned)
12/21/2023 9:44 AM   Jordan Zimmerman 1971/05/09 [id number]  Referring provider: Mort Sawyers, FNP [address] [place],  [place] [address]  Urological history: 1.  Nephrolithiasis -right ESWL (2018)  -left URS (2024)   2. OAB -Contributing factors of age, IBS, depression, anxiety, PTSD and history of smoking -Gemtesa 75 mg daily   No chief complaint on file.  HPI: Jordan Zimmerman is a 53 y.o. female who presents today for follow up from CT scan ordered by PCP.    Previous records reviewed.   She saw her PCP on November 24, 2023 with the complaints of epigastric pain associated with bilateral ankle pain and swelling intermittently for the past few weeks.    She had a contrast CT in December 2024, which noted mild bilateral hydronephrosis and hydroureter without ureteral calculi and mild circumferential bladder wall thickening.  She also had bilateral nonobstructing renal calculi.  Constipation.  And dilation of her proximal common bile duct measuring up to 7 mm.  She had a follow-up abdominal ultrasound which noted mild right pelviectasis and left nephrolithiasis.  Her PCP is wondering if this is contributing to her epigastric pain.  PVR ***  UA ***        PMH: Past Medical History:  Diagnosis Date   Anxiety    Chronic fatigue    Chronic low back pain with bilateral sciatica    Depression    Endometriosis    Fibromyalgia    History of kidney stones    IBS (irritable bowel syndrome)    Kidney stones    Migraines    PTSD (post-traumatic stress disorder)    Syncope     Surgical History: Past Surgical History:  Procedure Laterality Date   CHOLECYSTECTOMY  2001   COLONOSCOPY N/A 07/21/2021   Procedure: COLONOSCOPY;  Surgeon: [name], MD;  Location: [facility];  Service: Gastroenterology;  Laterality: N/A;   CYSTOSCOPY W/ URETEROSCOPY  ~ 2008   "pulled stone rest of the way out" (05/21/2013)   CYSTOSCOPY/URETEROSCOPY/HOLMIUM LASER/STENT PLACEMENT  Left 03/28/2023   Procedure: CYSTOSCOPY/URETEROSCOPY/HOLMIUM LASER/STENT PLACEMENT;  Surgeon: Vanna Scotland, MD;  Location: [facility] ORS;  Service: Urology;  Laterality: Left;   EXTRACORPOREAL SHOCK WAVE LITHOTRIPSY Right 10/06/2017   Procedure: RIGHT EXTRACORPOREAL SHOCK WAVE LITHOTRIPSY (ESWL);  Surgeon: Bjorn Pippin, MD;  Location: [facility] ORS;  Service: Urology;  Laterality: Right;   FOOT FRACTURE SURGERY Bilateral    TONSILLECTOMY  1992   UMBILICAL HERNIA REPAIR N/A 07/20/2023   Procedure: HERNIA REPAIR UMBILICAL ADULT, open;  Surgeon: Campbell Lerner, MD;  Location: [facility] ORS;  Service: General;  Laterality: N/A;    Home Medications:  Allergies as of 12/21/2023       Reactions   Ambien [zolpidem Tartrate] Anaphylaxis   Amoxicillin Hives, Swelling   Fever.    Codeine Other (See Comments)   migraines   Penicillins Hives, Other (See Comments)   High fever        Medication List        Accurate as of December 20, 2023  9:44 AM. If you have any questions, ask your nurse or doctor.          acetaminophen 500 MG tablet Commonly known as: TYLENOL Take 1,000 mg by mouth every 6 (six) hours as needed.   Ajovy 225 MG/1.5ML Soaj Generic drug: Fremanezumab-vfrm Inject 225 mg into the skin every 30 (thirty) days.   Gemtesa 75 MG Tabs Generic drug: Vibegron Take 1 tablet (75 mg total) by mouth  daily.   levonorgestrel 20 MCG/DAY Iud Commonly known as: MIRENA 1 each by Intrauterine route once.   omeprazole 20 MG capsule Commonly known as: PRILOSEC Take 1 capsule (20 mg total) by mouth daily.   ondansetron 4 MG tablet Commonly known as: Zofran Take 1 tablet (4 mg total) by mouth every 8 (eight) hours as needed for nausea or vomiting.   propranolol ER 60 MG 24 hr capsule Commonly known as: INDERAL LA Take 1 capsule (60 mg total) by mouth daily.   rizatriptan 10 MG disintegrating tablet Commonly known as: MAXALT-MLT Take 1 tablet (10 mg total) by mouth as needed for migraine.  May repeat in 2 hours if needed        Allergies:  Allergies  Allergen Reactions   Ambien [Zolpidem Tartrate] Anaphylaxis   Amoxicillin Hives and Swelling    Fever.    Codeine Other (See Comments)    migraines   Penicillins Hives and Other (See Comments)    High fever    Family History: Family History  Problem Relation Age of Onset   Diabetes Mother    Hypertension Father    Diabetes Father    Prostate cancer Father    Other Brother        Disk Disease   Breast cancer Maternal Grandmother    Breast cancer Maternal Aunt 69    Social History:  reports that she quit smoking about 29 years ago. Her smoking use included cigarettes. She started smoking about 33 years ago. She has a 4 pack-year smoking history. She has been exposed to tobacco smoke. She has never used smokeless tobacco. She reports current drug use. Drug: Marijuana. She reports that she does not drink alcohol.  ROS: Pertinent ROS in HPI  Physical Exam: LMP 11/29/2023   Constitutional:  Well nourished. Alert and oriented, No acute distress. HEENT:  AT, moist mucus membranes.  Trachea midline, no masses. Cardiovascular: No clubbing, cyanosis, or edema. Respiratory: Normal respiratory effort, no increased work of breathing. GU: No CVA tenderness.  No bladder fullness or masses.  Recession of labia minora, dry, pale vulvar vaginal mucosa and loss of mucosal ridges and folds.  Normal urethral meatus, no lesions, no prolapse, no discharge.   No urethral masses, tenderness and/or tenderness. No bladder fullness, tenderness or masses. *** vagina mucosa, *** estrogen effect, no discharge, no lesions, *** pelvic support, *** cystocele and *** rectocele noted.  No cervical motion tenderness.  Uterus is freely mobile and non-fixed.  No adnexal/parametria masses or tenderness noted.  Anus and perineum are without rashes or lesions.   ***  Neurologic: Grossly intact, no focal deficits, moving all 4 extremities. Psychiatric:  Normal mood and affect.    Laboratory Data: Lab Results  Component Value Date   WBC 9.2 11/25/2023   HGB 13.4 11/25/2023   HCT 40.5 11/25/2023   MCV 97.1 11/25/2023   PLT 276.0 11/25/2023    Lab Results  Component Value Date   CREATININE 0.98 11/25/2023    Lab Results  Component Value Date   HGBA1C 5.6 11/28/2023    Lab Results  Component Value Date   TSH 1.58 01/03/2023       Component Value Date/Time   CHOL 173 11/25/2023 0841   HDL 56.70 11/25/2023 0841   CHOLHDL 3 11/25/2023 0841   VLDL 15.8 11/25/2023 0841   LDLCALC 100 (H) 11/25/2023 0841    Lab Results  Component Value Date   AST 15 11/25/2023   Lab Results  Component Value  Date   ALT 7 11/25/2023   Urinalysis See EPIC and HPI  I have reviewed the labs.   Pertinent Imaging: ***  Assessment & Plan:  ***  1.  Nephrolithiasis -Bilateral nonobstructing renal calculi on December 2024 CT -Explained that nonobstructing stones typically do not cause pain and that kidney stone pain is typically in the flank area, the waist, the groin and inner thigh, not epigastric  2. Mild bilateral hydronephrosis -   2. OAB -PVR demonstrates adequate emptying ***   No follow-ups on file.  These notes generated with voice recognition software. I apologize for typographical errors.  Cloretta Ned  [facility] [address]  [address] [place], [place] [address] [phone]

## 2023-12-21 ENCOUNTER — Encounter: Payer: Self-pay | Admitting: Family

## 2023-12-21 ENCOUNTER — Ambulatory Visit (INDEPENDENT_AMBULATORY_CARE_PROVIDER_SITE_OTHER): Payer: BC Managed Care – PPO | Admitting: Urology

## 2023-12-21 ENCOUNTER — Telehealth: Payer: Self-pay | Admitting: Family

## 2023-12-21 ENCOUNTER — Encounter: Payer: Self-pay | Admitting: Urology

## 2023-12-21 VITALS — BP 124/76 | HR 64

## 2023-12-21 DIAGNOSIS — N1330 Unspecified hydronephrosis: Secondary | ICD-10-CM

## 2023-12-21 DIAGNOSIS — R109 Unspecified abdominal pain: Secondary | ICD-10-CM

## 2023-12-21 DIAGNOSIS — N3281 Overactive bladder: Secondary | ICD-10-CM

## 2023-12-21 DIAGNOSIS — N200 Calculus of kidney: Secondary | ICD-10-CM

## 2023-12-21 LAB — MICROSCOPIC EXAMINATION: Epithelial Cells (non renal): >10 /[HPF] — AB (ref 0–10)

## 2023-12-21 LAB — URINALYSIS, COMPLETE
Bilirubin, UA: NEGATIVE
Glucose, UA: NEGATIVE
Ketones, UA: NEGATIVE
Leukocytes,UA: NEGATIVE
Nitrite, UA: NEGATIVE
Protein,UA: NEGATIVE
Specific Gravity, UA: 1.02 (ref 1.005–1.030)
Urobilinogen, Ur: 0.2 mg/dL (ref 0.2–1.0)
pH, UA: 7 (ref 5.0–7.5)

## 2023-12-21 LAB — BLADDER SCAN AMB NON-IMAGING: Scan Result: 0

## 2023-12-21 MED ORDER — HYDROCODONE-ACETAMINOPHEN 5-325 MG PO TABS: 1.0000 | ORAL_TABLET | Freq: Four times a day (QID) | ORAL | 0 refills | Status: DC | PRN

## 2023-12-21 MED ORDER — SULFAMETHOXAZOLE-TRIMETHOPRIM 800-160 MG PO TABS: 1.0000 | ORAL_TABLET | Freq: Two times a day (BID) | ORAL | 0 refills | Status: DC

## 2023-12-21 MED ORDER — TAMSULOSIN HCL 0.4 MG PO CAPS: 0.4000 mg | ORAL_CAPSULE | Freq: Every day | ORAL | 0 refills | Status: DC

## 2023-12-21 NOTE — Telephone Encounter (Signed)
Copied from CRM [id number]. Topic: General - Call Back - No Documentation >> Dec 21, 2023  8:22 AM Jordan Zimmerman wrote: Reason for CRM: Patient returning phone call from [place], voicemail had no details.

## 2023-12-21 NOTE — Telephone Encounter (Signed)
This message has already been addressed. Pt was contacted through MyChart.

## 2023-12-22 ENCOUNTER — Encounter: Payer: Self-pay | Admitting: Urology

## 2023-12-22 ENCOUNTER — Other Ambulatory Visit: Payer: Self-pay | Admitting: Urology

## 2023-12-22 DIAGNOSIS — R3129 Other microscopic hematuria: Secondary | ICD-10-CM

## 2023-12-22 MED ORDER — CIPROFLOXACIN HCL 500 MG PO TABS: 500.0000 mg | ORAL_TABLET | Freq: Two times a day (BID) | ORAL | 0 refills | Status: AC

## 2023-12-24 LAB — CULTURE, URINE COMPREHENSIVE

## 2023-12-26 NOTE — Progress Notes (Unsigned)
12/28/2023 4:24 PM   Jordan Zimmerman [birth year] [id number]  Referring provider: Mort Sawyers, FNP [address] [place],  [place] [address]  Urological history: 1.  Nephrolithiasis -right ESWL (2018)  -left URS (2024)   2. OAB -Contributing factors of age, IBS, depression, anxiety, PTSD and history of smoking -Gemtesa 75 mg daily   Chief Complaint  Patient presents with   Follow-up   HPI: Jordan Zimmerman is a 53 y.o. female who presents today for follow up.      Previous records reviewed.   At her visit on 12/21/2023,sShe saw her PCP on November 24, 2023 with the complaints of epigastric pain associated with bilateral ankle pain and swelling intermittently for the past few weeks.   She had a contrast CT in December 2024, which noted mild bilateral hydronephrosis and hydroureter without ureteral calculi and mild circumferential bladder wall thickening.  She also had bilateral nonobstructing renal calculi.  Constipation.  And dilation of her proximal common bile duct measuring up to 7 mm.  She had a follow-up abdominal ultrasound which noted mild right pelviectasis and left nephrolithiasis.  Her PCP is wondering if this is contributing to her epigastric pain.  She is having 8 or more daytime voids, 1-2 episodes of nocturia with strong urge to urinate.  She has urinary leakage with both stress and urge.  She is leaking 1-2 times a day.  She does engage in toilet mapping.   For the last week she has been having bilateral flank pain that is so intense it can keep her up all night.  She is also been nauseated.  She states it feels just like when she had her stones in the past.  PVR 0 mL   UA yellow clear, specific every 1.020, 1+ blood, pH 7.0, 0-5 WBCs, 3-10 RBCs, greater than 10 epithelial cells and moderate bacteria.   Urine culture positive for MUF.   She was instructed to continue the [place].  She was started on Cipro, given VIcodin and tamsulosin for possible obstructing stone.     Her symptoms have not improved while she has been on the antibiotic.  She is having lower back pain that radiates around to her suprapubic area.  She does feel like this is a stone trying to pass.  Patient denies any modifying or aggravating factors.  Patient denies any recent UTI's, gross hematuria, dysuria or suprapubic/flank pain.  Patient denies any fevers, chills or vomiting.  She continues to have nausea.    UA yellow clear, specific gravity greater than 1.030, trace blood, pH 5.5, 0-5 WBCs, 3-10 RBCs, greater than 10 epithelial cells, mucus threads present and moderate bacteria.  PMH: Past Medical History:  Diagnosis Date   Anxiety    Chronic fatigue    Chronic low back pain with bilateral sciatica    Depression    Endometriosis    Fibromyalgia    History of kidney stones    IBS (irritable bowel syndrome)    Kidney stones    Migraines    PTSD (post-traumatic stress disorder)    Syncope     Surgical History: Past Surgical History:  Procedure Laterality Date   CHOLECYSTECTOMY  2001   COLONOSCOPY N/A 07/21/2021   Procedure: COLONOSCOPY;  Surgeon: [name], MD;  Location: [facility] ENDOSCOPY;  Service: Gastroenterology;  Laterality: N/A;   CYSTOSCOPY W/ URETEROSCOPY  ~ 2008   "pulled stone rest of the way out" (05/21/2013)   CYSTOSCOPY/URETEROSCOPY/HOLMIUM LASER/STENT PLACEMENT Left 03/28/2023   Procedure:  CYSTOSCOPY/URETEROSCOPY/HOLMIUM LASER/STENT PLACEMENT;  Surgeon: Vanna Scotland, MD;  Location: [facility] ORS;  Service: Urology;  Laterality: Left;   EXTRACORPOREAL SHOCK WAVE LITHOTRIPSY Right 10/06/2017   Procedure: RIGHT EXTRACORPOREAL SHOCK WAVE LITHOTRIPSY (ESWL);  Surgeon: Bjorn Pippin, MD;  Location: [facility] ORS;  Service: Urology;  Laterality: Right;   FOOT FRACTURE SURGERY Bilateral    TONSILLECTOMY  1992   UMBILICAL HERNIA REPAIR N/A 07/20/2023   Procedure: HERNIA REPAIR UMBILICAL ADULT, open;  Surgeon: Campbell Lerner, MD;  Location: [facility] ORS;  Service: General;  Laterality:  N/A;    Home Medications:  Allergies as of 12/28/2023       Reactions   Ambien [zolpidem Tartrate] Anaphylaxis   Amoxicillin Hives, Swelling   Fever.    Codeine Other (See Comments)   migraines   Penicillins Hives, Other (See Comments)   High fever   Sulfamethizole Hives, Itching   Itching in throat and mouth         Medication List        Accurate as of December 28, 2023  4:24 PM. If you have any questions, ask your nurse or doctor.          Ajovy 225 MG/1.5ML Soaj Generic drug: Fremanezumab-vfrm Inject 225 mg into the skin every 30 (thirty) days.   ciprofloxacin 500 MG tablet Commonly known as: Cipro Take 1 tablet (500 mg total) by mouth 2 (two) times daily for 10 days.   Gemtesa 75 MG Tabs Generic drug: Vibegron Take 1 tablet (75 mg total) by mouth daily.   HYDROcodone-acetaminophen 5-325 MG tablet Commonly known as: NORCO/VICODIN Take 1 tablet by mouth every 6 (six) hours as needed for moderate pain (pain score 4-6).   levonorgestrel 20 MCG/DAY Iud Commonly known as: MIRENA 1 each by Intrauterine route once.   omeprazole 20 MG capsule Commonly known as: PRILOSEC Take 1 capsule (20 mg total) by mouth daily.   ondansetron 4 MG tablet Commonly known as: Zofran Take 1 tablet (4 mg total) by mouth every 8 (eight) hours as needed for nausea or vomiting.   propranolol ER 60 MG 24 hr capsule Commonly known as: INDERAL LA Take 1 capsule (60 mg total) by mouth daily.   rizatriptan 10 MG disintegrating tablet Commonly known as: MAXALT-MLT Take 1 tablet (10 mg total) by mouth as needed for migraine. May repeat in 2 hours if needed   sulfamethoxazole-trimethoprim 800-160 MG tablet Commonly known as: BACTRIM DS Take 1 tablet by mouth every 12 (twelve) hours.   tamsulosin 0.4 MG Caps capsule Commonly known as: FLOMAX Take 1 capsule (0.4 mg total) by mouth daily.        Allergies:  Allergies  Allergen Reactions   Ambien [Zolpidem Tartrate]  Anaphylaxis   Amoxicillin Hives and Swelling    Fever.    Codeine Other (See Comments)    migraines   Penicillins Hives and Other (See Comments)    High fever   Sulfamethizole Hives and Itching    Itching in throat and mouth     Family History: Family History  Problem Relation Age of Onset   Diabetes Mother    Hypertension Father    Diabetes Father    Prostate cancer Father    Other Brother        Disk Disease   Breast cancer Maternal Grandmother    Breast cancer Maternal Aunt 88    Social History:  reports that she quit smoking about 29 years ago. Her smoking use included cigarettes. She started smoking about 33  years ago. She has a 4 pack-year smoking history. She has been exposed to tobacco smoke. She has never used smokeless tobacco. She reports current drug use. Drug: Marijuana. She reports that she does not drink alcohol.  ROS: Pertinent ROS in HPI  Physical Exam: BP 101/65   Pulse 63   LMP 11/29/2023   Constitutional:  Well nourished. Alert and oriented, No acute distress. HEENT: [place] AT, moist mucus membranes.  Trachea midline, no masses. Cardiovascular: No clubbing, cyanosis, or edema. Respiratory: Normal respiratory effort, no increased work of breathing. Neurologic: Grossly intact, no focal deficits, moving all 4 extremities. Psychiatric: Normal mood and affect.    Laboratory Data: N/A   Pertinent Imaging: N/A   Assessment & Plan:    1.  Nephrolithiasis -Bilateral nonobstructing renal calculi on December 2024 CT -She is pretty convinced that it is a stone trauma in the past, so we will go ahead and order a CT renal stone study  2. Mild bilateral hydronephrosis -May be secondary to ureteral stone -CT renal stone study pending  3. OAB -PVR demonstrates adequate emptying  -Continue Gemtesa 75 mg daily   Return for pending CT scan results .  These notes generated with voice recognition software. I apologize for typographical errors.  Cloretta Ned  [facility] [address]  [address] [place], [place] [address] [phone]

## 2023-12-28 ENCOUNTER — Ambulatory Visit: Payer: BC Managed Care – PPO | Admitting: Urology

## 2023-12-28 ENCOUNTER — Other Ambulatory Visit: Payer: Self-pay | Admitting: Family Medicine

## 2023-12-28 ENCOUNTER — Encounter: Payer: Self-pay | Admitting: Urology

## 2023-12-28 VITALS — BP 101/65 | HR 63

## 2023-12-28 DIAGNOSIS — N1330 Unspecified hydronephrosis: Secondary | ICD-10-CM | POA: Diagnosis not present

## 2023-12-28 DIAGNOSIS — N200 Calculus of kidney: Secondary | ICD-10-CM

## 2023-12-28 DIAGNOSIS — N3281 Overactive bladder: Secondary | ICD-10-CM | POA: Diagnosis not present

## 2023-12-28 LAB — URINALYSIS, COMPLETE
Bilirubin, UA: NEGATIVE
Glucose, UA: NEGATIVE
Ketones, UA: NEGATIVE
Leukocytes,UA: NEGATIVE
Nitrite, UA: NEGATIVE
Protein,UA: NEGATIVE
Specific Gravity, UA: >1.03 — ABNORMAL HIGH (ref 1.005–1.030)
Urobilinogen, Ur: 0.2 mg/dL (ref 0.2–1.0)
pH, UA: 5.5 (ref 5.0–7.5)

## 2023-12-28 LAB — MICROSCOPIC EXAMINATION: Epithelial Cells (non renal): >10 /[HPF] — AB (ref 0–10)

## 2023-12-29 ENCOUNTER — Ambulatory Visit
Admission: RE | Admit: 2023-12-29 | Discharge: 2023-12-29 | Disposition: A | Discharge status: Home / Self Care | Payer: BC Managed Care – PPO | Source: Ambulatory Visit | Attending: Urology | Admitting: Urology

## 2023-12-29 DIAGNOSIS — N200 Calculus of kidney: Secondary | ICD-10-CM | POA: Diagnosis not present

## 2023-12-29 DIAGNOSIS — N83291 Other ovarian cyst, right side: Secondary | ICD-10-CM | POA: Diagnosis not present

## 2023-12-29 DIAGNOSIS — N1330 Unspecified hydronephrosis: Secondary | ICD-10-CM | POA: Insufficient documentation

## 2023-12-30 ENCOUNTER — Telehealth: Payer: Self-pay | Admitting: Urology

## 2023-12-30 DIAGNOSIS — F411 Generalized anxiety disorder: Secondary | ICD-10-CM | POA: Diagnosis not present

## 2023-12-30 DIAGNOSIS — F5082 Avoidant/restrictive food intake disorder: Secondary | ICD-10-CM | POA: Diagnosis not present

## 2023-12-30 DIAGNOSIS — F4310 Post-traumatic stress disorder, unspecified: Secondary | ICD-10-CM | POA: Diagnosis not present

## 2023-12-30 DIAGNOSIS — F331 Major depressive disorder, recurrent, moderate: Secondary | ICD-10-CM | POA: Diagnosis not present

## 2023-12-30 NOTE — Telephone Encounter (Signed)
Appointment scheduled and detailed message was left for the patient and mychart message sent.

## 2023-12-30 NOTE — Telephone Encounter (Signed)
Will you reach out to [facility] and schedule her for cystoscopy with Dr. Apolinar Junes.  She has had microscopic hematuria on 2 different urinalysis.  She has had a negative urine culture.

## 2024-01-13 DIAGNOSIS — F4310 Post-traumatic stress disorder, unspecified: Secondary | ICD-10-CM | POA: Diagnosis not present

## 2024-01-13 DIAGNOSIS — F411 Generalized anxiety disorder: Secondary | ICD-10-CM | POA: Diagnosis not present

## 2024-01-13 DIAGNOSIS — F5082 Avoidant/restrictive food intake disorder: Secondary | ICD-10-CM | POA: Diagnosis not present

## 2024-01-13 DIAGNOSIS — F331 Major depressive disorder, recurrent, moderate: Secondary | ICD-10-CM | POA: Diagnosis not present

## 2024-01-13 NOTE — Patient Instructions (Signed)
Below is our plan:  We will switch Ajovy to [employer] Continue propranolol and rizatriptan as prescribed. We will refer you to sleep for evaluation of sleep apnea. Listen for a phone call to schedule.   Please make sure you are staying well hydrated. I recommend 50-60 ounces daily. Well balanced diet and regular exercise encouraged. Consistent sleep schedule with 6-8 hours recommended.   Please continue follow up with care team as directed.   Follow up with me pending sleep eval  You may receive a survey regarding today's visit. I encourage you to leave honest feed back as I do use this information to improve patient care. Thank you for seeing me today!   GENERAL HEADACHE INFORMATION:   Natural supplements: Magnesium Oxide or Magnesium Glycinate 500 mg at bed (up to 800 mg daily) Coenzyme Q10 300 mg in AM Vitamin B2- 200 mg twice a day   Add 1 supplement at a time since even natural supplements can have undesirable side effects. You can sometimes buy supplements cheaper (especially Coenzyme Q10) at www.WebmailGuide.co.za or at [facility].  Migraine with aura: There is increased risk for stroke in women with migraine with aura and a contraindication for the combined contraceptive pill for use by women who have migraine with aura. The risk for women with migraine without aura is lower. However other risk factors like smoking are far more likely to increase stroke risk than migraine. There is a recommendation for no smoking and for the use of OCPs without estrogen such as progestogen only pills particularly for women with migraine with aura.Marland Kitchen People who have migraine headaches with auras may be 3 times more likely to have a stroke caused by a blood clot, compared to migraine patients who don't see auras. Women who take hormone-replacement therapy may be 30 percent more likely to suffer a clot-based stroke than women not taking medication containing estrogen. Other risk factors like smoking and high blood  pressure may be  much more important.    Vitamins and herbs that show potential:   Magnesium: Magnesium (250 mg twice a day or 500 mg at bed) has a relaxant effect on smooth muscles such as blood vessels. Individuals suffering from frequent or daily headache usually have low magnesium levels which can be increase with daily supplementation of 400-750 mg. Three trials found 40-90% average headache reduction  when used as a preventative. Magnesium may help with headaches are aura, the best evidence for magnesium is for migraine with aura is its thought to stop the cortical spreading depression we believe is the pathophysiology of migraine aura.Magnesium also demonstrated the benefit in menstrually related migraine.  Magnesium is part of the messenger system in the serotonin cascade and it is a good muscle relaxant.  It is also useful for constipation which can be a side effect of other medications used to treat migraine. Good sources include nuts, whole grains, and tomatoes. Side Effects: loose stool/diarrhea  Riboflavin (vitamin B 2) 200 mg twice a day. This vitamin assists nerve cells in the production of ATP a principal energy storing molecule.  It is necessary for many chemical reactions in the body.  There have been at least 3 clinical trials of riboflavin using 400 mg per day all of which suggested that migraine frequency can be decreased.  All 3 trials showed significant improvement in over half of migraine sufferers.  The supplement is found in bread, cereal, milk, meat, and poultry.  Most Americans get more riboflavin than the recommended daily allowance, however  riboflavin deficiency is not necessary for the supplements to help prevent headache. Side effects: energizing, green urine   Coenzyme Q10: This is present in almost all cells in the body and is critical component for the conversion of energy.  Recent studies have shown that a nutritional supplement of CoQ10 can reduce the frequency of  migraine attacks by improving the energy production of cells as with riboflavin.  Doses of 150 mg twice a day have been shown to be effective.   Melatonin: Increasing evidence shows correlation between melatonin secretion and headache conditions.  Melatonin supplementation has decreased headache intensity and duration.  It is widely used as a sleep aid.  Sleep is natures way of dealing with migraine.  A dose of 3 mg is recommended to start for headaches including cluster headache. Higher doses up to 15 mg has been reviewed for use in Cluster headache and have been used. The rationale behind using melatonin for cluster is that many theories regarding the cause of Cluster headache center around the disruption of the normal circadian rhythm in the brain.  This helps restore the normal circadian rhythm.   HEADACHE DIET: Foods and beverages which may trigger migraine Note that only 20% of headache patients are food sensitive. You will know if you are food sensitive if you get a headache consistently 20 minutes to 2 hours after eating a certain food. Only cut out a food if it causes headaches, otherwise you might remove foods you enjoy! What matters most for diet is to eat a well balanced healthy diet full of vegetables and low fat protein, and to not miss meals.   Chocolate, other sweets ALL cheeses except cottage and cream cheese Dairy products, yogurt, sour cream, ice cream Liver Meat extracts (Bovril, Marmite, meat tenderizers) Meats or fish which have undergone aging, fermenting, pickling or smoking. These include: Hotdogs,salami,Lox,sausage, mortadellas,smoked salmon, pepperoni, Pickled herring Pods of broad bean (English beans, Chinese pea pods, [place] (fava) beans, lima and navy beans Ripe avocado, ripe banana Yeast extracts or active yeast preparations such as Brewer's or Fleishman's (commercial bakes goods are permitted) Tomato based foods, pizza (lasagna, etc.)   MSG (monosodium glutamate)  is disguised as many things; look for these common aliases: Monopotassium glutamate Autolysed yeast Hydrolysed protein Sodium caseinate "flavorings" "all natural preservatives" Nutrasweet   Avoid all other foods that convincingly provoke headaches.   Resources: The Dizzy [place] Laundry Your Headache Diet, migrainestrong.com  https://zamora-andrews.com/   Caffeine and Migraine For patients that have migraine, caffeine intake more than 3 days per week can lead to dependency and increased migraine frequency. I would recommend cutting back on your caffeine intake as best you can. The recommended amount of caffeine is 200-300 mg daily, although migraine patients may experience dependency at even lower doses. While you may notice an increase in headache temporarily, cutting back will be helpful for headaches in the long run. For more information on caffeine and migraine, visit: https://americanmigrainefoundation.org/resource-library/caffeine-and-migraine/   Headache Prevention Strategies:   1. Maintain a headache diary; learn to identify and avoid triggers.  - This can be a simple note where you log when you had a headache, associated symptoms, and medications used - There are several smartphone apps developed to help track migraines: Migraine Buddy, Migraine Monitor, Curelator N1-Headache App   Common triggers include: Emotional triggers: Emotional/Upset family or friends Emotional/Upset occupation Business reversal/success Anticipation anxiety Crisis-serious Post-crisis periodNew job/position   Physical triggers: Vacation Day Weekend Strenuous Exercise High Altitude Location New Move Menstrual Day Physical  Illness Oversleep/Not enough sleep Weather changes Light: Photophobia or light sesnitivity treatment involves a balance between desensitization and reduction in overly strong input. Use dark polarized glasses outside, but not inside.  Avoid bright or fluorescent light, but do not dim environment to the point that going into a normally lit room hurts. Consider FL-41 tint lenses, which reduce the most irritating wavelengths without blocking too much light.  These can be obtained at axonoptics.com or theraspecs.com Foods: see list above.   2. Limit use of acute treatments (over-the-counter medications, triptans, etc.) to no more than 2 days per week or 10 days per month to prevent medication overuse headache (rebound headache).     3. Follow a regular schedule (including weekends and holidays): Don't skip meals. Eat a balanced diet. 8 hours of sleep nightly. Minimize stress. Exercise 30 minutes per day. Being overweight is associated with a 5 times increased risk of chronic migraine. Keep well hydrated and drink 6-8 glasses of water per day.   4. Initiate non-pharmacologic measures at the earliest onset of your headache. Rest and quiet environment. Relax and reduce stress. Breathe2Relax is a free app that can instruct you on    some simple relaxtion and breathing techniques. Http://Dawnbuse.com is a    free website that provides teaching videos on relaxation.  Also, there are  many apps that   can be downloaded for "mindful" relaxation.  An app called YOGA NIDRA will help walk you through mindfulness. Another app called Calm can be downloaded to give you a structured mindfulness guide with daily reminders and skill development. Headspace for guided meditation Mindfulness Based Stress Reduction Online Course: www.palousemindfulness.com Cold compresses.   5. Don't wait!! Take the maximum allowable dosage of prescribed medication at the first sign of migraine.   6. Compliance:  Take prescribed medication regularly as directed and at the first sign of a migraine.   7. Communicate:  Call your physician when problems arise, especially if your headaches change, increase in frequency/severity, or become associated with neurological  symptoms (weakness, numbness, slurred speech, etc.). Proceed to emergency room if you experience new or worsening symptoms or symptoms do not resolve, if you have new neurologic symptoms or if headache is severe, or for any concerning symptom.   8. Headache/pain management therapies: Consider various complementary methods, including medication, behavioral therapy, psychological counselling, biofeedback, massage therapy, acupuncture, dry needling, and other modalities.  Such measures may reduce the need for medications. Counseling for pain management, where patients learn to function and ignore/minimize their pain, seems to work very well.   9. Recommend changing family's attention and focus away from patient's headaches. Instead, emphasize daily activities. If first question of day is 'How are your headaches/Do you have a headache today?', then patient will constantly think about headaches, thus making them worse. Goal is to re-direct attention away from headaches, toward daily activities and other distractions.   10. Helpful Websites: www.AmericanHeadacheSociety.org PatentHood.ch www.headaches.org TightMarket.nl www.achenet.org

## 2024-01-13 NOTE — Progress Notes (Signed)
Chief Complaint  Patient presents with   Follow-up    Pt in 2 with dog . Pt here for Migraine f/u Pt states daily migraines Pt states migraine pain in forehead Pt states pulsating pain Pt states can hear heartbeak in ears Pt states on Ajovy Pt states not working well for migraines     HISTORY OF PRESENT ILLNESS:  01/17/24 ALL:  Jordan Zimmerman for follow up for migraines. She was last seen 06/2023 and reported nausea on Qulipta. We switched her to Ajovy and continued propranolol. Rizatritpan started and ondansetron continued for abortive therapy. Since, she reports continued headaches. She initially felt about 50% improvement headache intensity and frequency but recently feels it is no longer working. She is having daily headaches. She wakes with headaches most every day. Throbbing/pounding. Light sensitivity and nausea. Rizatriptan and ondansetron help. BP is usually normal. She has not been able to find any particular triggers. She reports husband has told her she wakes her self up at night making snoring sounds but she doesn't remember this. No family history of sleep apnea. She does not smoke. 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 female here today for follow up for migraines. She was seen in consult with Dr Delena Bali 09/2021. She was started on propranolol 20mg  BID and sumatriptan increased to 50mg  PRN. 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 Referral to ophthalmology placed. She was advised to follow up 12/2021. She called 07/2022 with worsening headaches and prorpanolol was increased to 60mg  daily  and sumatriptan increased to 100mg  PRN. PCP started [place] about 8 months ago and increased dose about 4 months ago.    Since, she reports daily headaches. She describes a pounding, pressure, throbbing pain. Usually on the right side. + Light and sound sensitivity. Nausea daily. Eye exam last week showed significant changes in prescription needs.   HISTORY (copied from Dr Quentin Mulling previous note)  Medical co-morbidities: fibromyalgia, depression, kidney stones  The patient presents for evaluation of headaches which began several years ago. The past month her migraines have been more severe. She injured her back around this time and thinks this may have triggered the worsening of her headaches. Service dog also passed away around this time. They have been slightly better this past week. Prior to that would have a migraine every other day. She does have lower level headaches on the days she does not have migraines. Migraines are described as frontal throbbing with associated photophobia, phonophobia, nausea, and vomiting.  Saw her PCP who ordered MRI brain but this hasn't been scheduled yet. Also prescribed Imitrex 25 mg, zofran 4 mg, and flexeril 5 mg PRN. Imitrex worked but would have to take a second dose after 8 hours because headache would return. Takes Zofran as needed for nausea. She has thrown up her rescue pills before. Takes flexeril for back spasms.  Takes Topamax and amitriptyline for headache prevention which she has been on for 2-3 years. Amitriptyline helps with sleep but she does  not feel that Topamax has been particularly helpful.  Headache History: Onset: several years ago Triggers: stress Aura: no Location: right frontal or bifrontal Quality/Description: throbbing Associated Symptoms:  Photophobia: yes  Phonophobia: yes  Nausea: yes Vomiting: yes Worse with activity?: yes Duration of headaches: 1 hour with Imitrex  Headache days per month: 30 Headache free days per month:  0  Current Treatment: Abortive Imitrex 25 mg PRN Zofran  Preventative Amitriptyline 100 mg QHS Topamax 50 mg QHS  Prior Therapies                                 Amitriptyline 100 mg QHS Topamax 50 mg QHS Cymbalta 60 mg daily Gabapentin 800 mg TID Imitrex 25 mg PRN Zofran Flexeril  Headache Risk Factors: Headache risk factors and/or co-morbidities (+) Neck Pain (+) Back Pain (+) Sleep Disorder - insomnia (-) Obesity  Body mass index is 24.96 kg/m. (-) History of Traumatic Brain Injury and/or Concussion   REVIEW OF SYSTEMS: Out of a complete 14 system review of symptoms, the patient complains only of the following symptoms, headaches, nausea, anxiety and all other reviewed systems are negative.   ALLERGIES: Allergies  Allergen Reactions   Ambien [Zolpidem Tartrate] Anaphylaxis   Amoxicillin Hives and Swelling    Fever.    Codeine Other (See Comments)    migraines   Penicillins Hives and Other (See Comments)    High fever   Sulfamethizole Hives and Itching    Itching in throat and mouth      HOME MEDICATIONS: Outpatient Medications Prior to Visit  Medication Sig Dispense Refill   levonorgestrel (MIRENA) 20 MCG/DAY IUD 1 each by Intrauterine route once.     omeprazole (PRILOSEC) 20 MG capsule Take 1 capsule (20 mg total) by mouth daily. 30 capsule 3   rizatriptan (MAXALT-MLT) 10 MG disintegrating tablet Take 1 tablet (10 mg total) by mouth as needed for migraine. May repeat in 2 hours if needed 9 tablet 11   Vibegron (GEMTESA) 75 MG TABS Take 1 tablet (75 mg total) by mouth daily. 30 tablet 11   Fremanezumab-vfrm (AJOVY) 225 MG/1.5ML SOAJ Inject 225 mg into the skin every 30 (thirty) days. 4.5 mL 3   ondansetron (ZOFRAN) 4 MG tablet Take 1 tablet (4 mg total) by mouth every 8 (eight) hours as needed for nausea or vomiting. 20 tablet 0   propranolol ER (INDERAL LA) 60 MG 24 hr capsule Take 1 capsule (60 mg total) by mouth daily. 90 capsule 0    HYDROcodone-acetaminophen (NORCO/VICODIN) 5-325 MG tablet Take 1 tablet by mouth every 6 (six) hours as needed for moderate pain (pain score 4-6). 10 tablet 0   sulfamethoxazole-trimethoprim (BACTRIM DS) 800-160 MG tablet Take 1 tablet by mouth every 12 (twelve) hours. 14 tablet 0   tamsulosin (FLOMAX) 0.4 MG CAPS capsule Take 1 capsule (0.4 mg total) by mouth daily. 30 capsule 0   No facility-administered medications prior to visit.     PAST MEDICAL HISTORY: Past Medical History:  Diagnosis Date   Anxiety    Chronic fatigue    Chronic low back pain with bilateral sciatica    Depression    Endometriosis    Fibromyalgia    History of kidney stones    IBS (irritable bowel syndrome)    Kidney stones    Migraines    PTSD (post-traumatic stress disorder)    Syncope      PAST SURGICAL  HISTORY: Past Surgical History:  Procedure Laterality Date   CHOLECYSTECTOMY  2001   COLONOSCOPY N/A 07/21/2021   Procedure: COLONOSCOPY;  Surgeon: [name], MD;  Location: [facility];  Service: Gastroenterology;  Laterality: N/A;   CYSTOSCOPY W/ URETEROSCOPY  ~ 2008   "pulled stone rest of the way out" (05/21/2013)   CYSTOSCOPY/URETEROSCOPY/HOLMIUM LASER/STENT PLACEMENT Left 03/28/2023   Procedure: CYSTOSCOPY/URETEROSCOPY/HOLMIUM LASER/STENT PLACEMENT;  Surgeon: Vanna Scotland, MD;  Location: [facility] ORS;  Service: Urology;  Laterality: Left;   EXTRACORPOREAL SHOCK WAVE LITHOTRIPSY Right 10/06/2017   Procedure: RIGHT EXTRACORPOREAL SHOCK WAVE LITHOTRIPSY (ESWL);  Surgeon: Bjorn Pippin, MD;  Location: [facility] ORS;  Service: Urology;  Laterality: Right;   FOOT FRACTURE SURGERY Bilateral    TONSILLECTOMY  1992   UMBILICAL HERNIA REPAIR N/A 07/20/2023   Procedure: HERNIA REPAIR UMBILICAL ADULT, open;  Surgeon: Campbell Lerner, MD;  Location: [facility] ORS;  Service: General;  Laterality: N/A;     FAMILY HISTORY: Family History  Problem Relation Age of Onset   Diabetes Mother    Hypertension Father     Diabetes Father    Prostate cancer Father    Other Brother        Disk Disease   Breast cancer Maternal Aunt 45   Breast cancer Maternal Grandmother    Migraines Neg Hx      SOCIAL HISTORY: Social History   Socioeconomic History   Marital status: Married    Spouse name: Fayrene Fearing   Number of children: 2   Years of education: high school   Highest education level: Not on file  Occupational History   Not on file  Tobacco Use   Smoking status: Former    Current packs/day: 0.00    Average packs/day: 1 pack/day for 4.0 years (4.0 ttl pk-yrs)    Types: Cigarettes    Start date: 11/29/1990    Quit date: 11/29/1994    Years since quitting: 29.1    Passive exposure: Past   Smokeless tobacco: Never  Vaping Use   Vaping status: Never Used  Substance and Sexual Activity   Alcohol use: No    Comment: occas   Drug use: Yes    Types: Marijuana    Comment: <1 week    Sexual activity: Yes    Birth control/protection: I.U.D.  Other Topics Concern   Not on file  Social History Narrative   03/09/21   From: the area   Living: with husband, Fayrene Fearing (1994) and daughter   Work: homemaker      Family: John and Psychiatric nurse (married and lives nearby) - 2 grandchildren      Enjoys: garden      Exercise: pool in the weather is nice, walking   Diet: IBS limits her options       Safety   Seat belts: Yes    Guns: Yes  and secure   Safe in relationships: Yes    Social Drivers of Health   Financial Resource Strain: Patient Declined (11/23/2023)   Overall Financial Resource Strain (CARDIA)    Difficulty of Paying Living Expenses: Patient declined  Food Insecurity: Patient Declined (11/23/2023)   Hunger Vital Sign    Worried About Running Out of Food in the Last Year: Patient declined    Ran Out of Food in the Last Year: Patient declined  Transportation Needs: Patient Declined (11/23/2023)   PRAPARE - Administrator, Civil Service (Medical): Patient declined    Lack of Transportation  (Non-Medical): Patient declined  Physical Activity: Unknown (  11/23/2023)   Exercise Vital Sign    Days of Exercise per Week: Patient declined    Minutes of Exercise per Session: Not on file  Stress: Patient Declined (11/23/2023)   [employer] of Occupational Health - Occupational Stress Questionnaire    Feeling of Stress : Patient declined  Social Connections: Unknown (11/23/2023)   Social Connection and Isolation Panel [NHANES]    Frequency of Communication with Friends and Family: Patient declined    Frequency of Social Gatherings with Friends and Family: Patient declined    Attends Religious Services: Patient declined    Database administrator or Organizations: Patient declined    Attends Banker Meetings: Not on file    Marital Status: Patient declined  Intimate Partner Violence: Not on file     PHYSICAL EXAM  Vitals:   01/17/24 1446  BP: 106/70  Pulse: 76  Weight: 133 lb (60.3 kg)  Height: 5\' 5"  (1.651 m)    Body mass index is 22.13 kg/m.  Generalized: Well developed, in no acute distress  Cardiology: normal rate and rhythm, no murmur auscultated  Respiratory: clear to auscultation bilaterally    Neurological examination  Mentation: Alert oriented to time, place, history taking. Follows all commands speech and language fluent Cranial nerve II-XII: Pupils were equal round reactive to light. Extraocular movements were full, visual field were full on confrontational test. Facial sensation and strength were normal. Uvula tongue midline. Head turning and shoulder shrug  were normal and symmetric. Motor: The motor testing reveals 5 over 5 strength of all 4 extremities. Good symmetric motor tone is noted throughout.  Sensory: Sensory testing is intact to soft touch on all 4 extremities. No evidence of extinction is noted.  Coordination: Cerebellar testing reveals good finger-nose-finger and heel-to-shin bilaterally.  Gait and station: Gait is normal.  Tandem gait is normal. Romberg is negative. No drift is seen.  Reflexes: Deep tendon reflexes are symmetric and normal bilaterally.    DIAGNOSTIC DATA (LABS, IMAGING, TESTING) - I reviewed patient records, labs, notes, testing and imaging myself where available.  Lab Results  Component Value Date   WBC 9.2 11/25/2023   HGB 13.4 11/25/2023   HCT 40.5 11/25/2023   MCV 97.1 11/25/2023   PLT 276.0 11/25/2023      Component Value Date/Time   NA 144 11/25/2023 0841   NA 141 06/29/2023 1453   K 3.8 11/25/2023 0841   CL 110 11/25/2023 0841   CO2 27 11/25/2023 0841   GLUCOSE 122 (H) 11/25/2023 0841   BUN 12 11/25/2023 0841   BUN 14 06/29/2023 1453   CREATININE 0.98 11/25/2023 0841   CALCIUM 9.1 11/25/2023 0841   PROT 6.1 11/25/2023 0841   PROT 6.5 06/03/2017 0917   ALBUMIN 4.2 11/25/2023 0841   ALBUMIN 4.3 06/03/2017 0917   AST 15 11/25/2023 0841   ALT 7 11/25/2023 0841   ALKPHOS 57 11/25/2023 0841   BILITOT 0.4 11/25/2023 0841   BILITOT <0.2 06/03/2017 0917   GFRNONAA >60 10/01/2017 0252   GFRAA >60 10/01/2017 0252   Lab Results  Component Value Date   CHOL 173 11/25/2023   HDL 56.70 11/25/2023   LDLCALC 100 (H) 11/25/2023   TRIG 79.0 11/25/2023   CHOLHDL 3 11/25/2023   Lab Results  Component Value Date   HGBA1C 5.6 11/28/2023   Lab Results  Component Value Date   VITAMINB12 >2000 (H) 07/13/2017   Lab Results  Component Value Date   TSH 1.58 01/03/2023  No data to display               No data to display           ASSESSMENT AND PLAN  53 y.o. year old female  has a past medical history of Anxiety, Chronic fatigue, Chronic low back pain with bilateral sciatica, Depression, Endometriosis, Fibromyalgia, History of kidney stones, IBS (irritable bowel syndrome), Kidney stones, Migraines, PTSD (post-traumatic stress disorder), and Syncope. here with    Intractable migraine with aura without status migrainosus - Plan: Ambulatory referral to  Sleep Studies  Morning headache - Plan: Ambulatory referral to Sleep Studies  Witnessed apneic spells - Plan: Ambulatory referral to Sleep Studies   Jordan Zimmerman reports daily headache, all being described as migrainous. She is having significant nausea daily on Qulipta. We will switch Ajovy injections to Emgality every 30 days. She will continue propranolol 60mg  daily, rizatriptan 10mg  and ondansetron as needed for abortive therapy.  Possible side effects, appropriate administration and storage of meds discussed. We will refer her to sleep med for sleep apnea evaluation due to husbands report of witnessed apneas. Healthy lifestyle habits encouraged. She will follow up with PCP as directed. She will return to see me pending sleep eval. She verbalizes understanding and agreement with this plan.    Orders Placed This Encounter  Procedures   Ambulatory referral to Sleep Studies    Referral Priority:   Routine    Referral Type:   Consultation    Referral Reason:   Specialty Services Required    Number of Visits Requested:   1     Meds ordered this encounter  Medications   DISCONTD: Galcanezumab-gnlm (EMGALITY) 120 MG/ML SOAJ    Sig: Inject 120 mg into the skin every 30 (thirty) days.    Dispense:  3 mL    Refill:  3    Supervising Provider:   Anson Fret [[id number]]   propranolol ER (INDERAL LA) 60 MG 24 hr capsule    Sig: Take 1 capsule (60 mg total) by mouth daily.    Dispense:  90 capsule    Refill:  3    Supervising Provider:   Anson Fret [[id number]]   ondansetron (ZOFRAN) 4 MG tablet    Sig: Take 1 tablet (4 mg total) by mouth every 8 (eight) hours as needed for nausea or vomiting.    Dispense:  20 tablet    Refill:  2    Supervising Provider:   Anson Fret [id number]    I spent 30 minutes of face-to-face and non-face-to-face time with patient.  This included previsit chart review, lab review, study review, order entry, electronic health record documentation, patient  education.   Shawnie Dapper, MSN, FNP-C 01/17/2024, 3:44 PM  [facility] [address] [place], [place] [address] [phone]

## 2024-01-16 ENCOUNTER — Other Ambulatory Visit: Payer: Self-pay

## 2024-01-16 MED ORDER — ONDANSETRON HCL 4 MG PO TABS: 4.0000 mg | ORAL_TABLET | Freq: Three times a day (TID) | ORAL | 0 refills | Status: DC | PRN

## 2024-01-16 NOTE — Telephone Encounter (Signed)
Received refill request from [facility] for zofran but when I attempted to refill it it said:  Pt was sent a mychart msg to confirm if they are actually using it or not

## 2024-01-16 NOTE — Addendum Note (Signed)
Addended by: Danne Harbor on: 01/16/2024 05:09 PM   Modules accepted: Orders

## 2024-01-17 ENCOUNTER — Other Ambulatory Visit: Payer: Self-pay | Admitting: Family Medicine

## 2024-01-17 ENCOUNTER — Encounter: Payer: Self-pay | Admitting: Family Medicine

## 2024-01-17 ENCOUNTER — Ambulatory Visit (INDEPENDENT_AMBULATORY_CARE_PROVIDER_SITE_OTHER): Payer: BC Managed Care – PPO | Admitting: Family Medicine

## 2024-01-17 VITALS — BP 106/70 | HR 76 | Ht 65.0 in | Wt 133.0 lb

## 2024-01-17 DIAGNOSIS — R519 Headache, unspecified: Secondary | ICD-10-CM

## 2024-01-17 DIAGNOSIS — R0681 Apnea, not elsewhere classified: Secondary | ICD-10-CM

## 2024-01-17 DIAGNOSIS — G43119 Migraine with aura, intractable, without status migrainosus: Secondary | ICD-10-CM | POA: Diagnosis not present

## 2024-01-17 MED ORDER — EMGALITY 120 MG/ML ~~LOC~~ SOAJ: 120.0000 mg | SUBCUTANEOUS | 3 refills | Status: DC

## 2024-01-17 MED ORDER — ONDANSETRON HCL 4 MG PO TABS
4.0000 mg | ORAL_TABLET | Freq: Three times a day (TID) | ORAL | 2 refills | Status: DC | PRN
Start: 2024-01-17 — End: 2024-06-06

## 2024-01-17 MED ORDER — PROPRANOLOL HCL ER 60 MG PO CP24: 60.0000 mg | ORAL_CAPSULE | Freq: Every day | ORAL | 3 refills | Status: AC

## 2024-01-20 DIAGNOSIS — F5082 Avoidant/restrictive food intake disorder: Secondary | ICD-10-CM | POA: Diagnosis not present

## 2024-01-20 DIAGNOSIS — F4310 Post-traumatic stress disorder, unspecified: Secondary | ICD-10-CM | POA: Diagnosis not present

## 2024-01-20 DIAGNOSIS — F331 Major depressive disorder, recurrent, moderate: Secondary | ICD-10-CM | POA: Diagnosis not present

## 2024-01-20 DIAGNOSIS — F411 Generalized anxiety disorder: Secondary | ICD-10-CM | POA: Diagnosis not present

## 2024-01-23 ENCOUNTER — Telehealth: Payer: Self-pay | Admitting: *Deleted

## 2024-01-23 ENCOUNTER — Encounter: Payer: Self-pay | Admitting: Family Medicine

## 2024-01-23 ENCOUNTER — Other Ambulatory Visit: Payer: Self-pay | Admitting: Family Medicine

## 2024-01-23 MED ORDER — EMGALITY 120 MG/ML ~~LOC~~ SOAJ: 120.0000 mg | SUBCUTANEOUS | 3 refills | Status: DC

## 2024-01-23 NOTE — Telephone Encounter (Signed)
 Spoke to patient did pick up Ajovy but was unaware what medication was until at home . Pt didn't pick up medication a friend did . Pt state Ajovy is not working .Per last office note will start Emgality. Pt agreeable to start Emgality Per Patient still has Ajovy injectable at home but haven't taken . Will forward to Amy,NP for approval

## 2024-01-23 NOTE — Addendum Note (Signed)
 Addended by: Raynald Kemp A on: 01/23/2024 04:25 PM   Modules accepted: Orders

## 2024-01-23 NOTE — Addendum Note (Signed)
 Addended by: Shawnie Dapper L on: 01/23/2024 04:27 PM   Modules accepted: Orders

## 2024-01-24 ENCOUNTER — Telehealth: Payer: Self-pay

## 2024-01-24 NOTE — Telephone Encounter (Signed)
Ajovy pa needed

## 2024-01-25 ENCOUNTER — Other Ambulatory Visit (HOSPITAL_COMMUNITY): Payer: Self-pay

## 2024-01-25 NOTE — Telephone Encounter (Signed)
#  4.5 mL (84 day supply) for Ajovy was sold 01-22-2024

## 2024-01-27 DIAGNOSIS — F411 Generalized anxiety disorder: Secondary | ICD-10-CM | POA: Diagnosis not present

## 2024-01-27 DIAGNOSIS — F4310 Post-traumatic stress disorder, unspecified: Secondary | ICD-10-CM | POA: Diagnosis not present

## 2024-01-27 DIAGNOSIS — F5082 Avoidant/restrictive food intake disorder: Secondary | ICD-10-CM | POA: Diagnosis not present

## 2024-01-27 DIAGNOSIS — F331 Major depressive disorder, recurrent, moderate: Secondary | ICD-10-CM | POA: Diagnosis not present

## 2024-02-01 ENCOUNTER — Telehealth: Payer: Self-pay | Admitting: Family

## 2024-02-01 ENCOUNTER — Telehealth: Payer: Self-pay

## 2024-02-01 ENCOUNTER — Other Ambulatory Visit: Payer: Self-pay | Admitting: Family Medicine

## 2024-02-01 ENCOUNTER — Other Ambulatory Visit: Payer: Self-pay | Admitting: *Deleted

## 2024-02-01 ENCOUNTER — Other Ambulatory Visit (HOSPITAL_COMMUNITY): Payer: Self-pay

## 2024-02-01 DIAGNOSIS — Z1239 Encounter for other screening for malignant neoplasm of breast: Secondary | ICD-10-CM

## 2024-02-01 MED ORDER — EMGALITY 120 MG/ML ~~LOC~~ SOAJ
120.0000 mg | SUBCUTANEOUS | 3 refills | Status: DC
Start: 2024-02-01 — End: 2024-02-07

## 2024-02-01 NOTE — Telephone Encounter (Signed)
 EMGALITY PA NEEDED

## 2024-02-01 NOTE — Telephone Encounter (Signed)
*  [facility]  Pharmacy Patient Advocate Encounter   Received notification from Pt Calls Messages that prior authorization for Emgality 120MG /ML auto-injectors (migraine)  is required/requested.   Insurance verification completed.   The patient is insured through [facility] .   Per test claim: PA required; PA submitted to above mentioned insurance via CoverMyMeds Key/confirmation #/EOC [id number] Status is pending

## 2024-02-01 NOTE — Telephone Encounter (Signed)
 PA request has been Submitted. New Encounter has been or will be created for follow up. For additional info see Pharmacy Prior Auth telephone encounter from 03/05.

## 2024-02-01 NOTE — Telephone Encounter (Signed)
 Per our records, pt is due for her mammogram. LM for pt to return call.

## 2024-02-02 NOTE — Telephone Encounter (Signed)
 Copied from CRM [id number]. Topic: General - Call Back - No Documentation >> Feb 02, 2024  2:27 PM Kathryne Eriksson wrote: Patient returning missed office call from Lorel [place], [place]

## 2024-02-03 ENCOUNTER — Ambulatory Visit
Admission: RE | Admit: 2024-02-03 | Discharge: 2024-02-03 | Disposition: A | Discharge status: Home / Self Care | Source: Ambulatory Visit | Attending: Family | Admitting: Family

## 2024-02-03 DIAGNOSIS — Z1231 Encounter for screening mammogram for malignant neoplasm of breast: Secondary | ICD-10-CM | POA: Diagnosis not present

## 2024-02-03 DIAGNOSIS — Z1239 Encounter for other screening for malignant neoplasm of breast: Secondary | ICD-10-CM

## 2024-02-03 NOTE — Telephone Encounter (Signed)
 Spoke with pt, she has been scheduled for the mammogram bus on 03/05/24.

## 2024-02-03 NOTE — Addendum Note (Signed)
 Addended by: Jaynee Eagles C on: 02/03/2024 08:42 AM   Modules accepted: Orders

## 2024-02-06 ENCOUNTER — Encounter: Payer: Self-pay | Admitting: Urology

## 2024-02-06 ENCOUNTER — Encounter: Payer: Self-pay | Admitting: Family

## 2024-02-06 MED ORDER — DIAZEPAM 10 MG PO TABS: 10.0000 mg | ORAL_TABLET | Freq: Once | ORAL | 0 refills | Status: AC

## 2024-02-07 ENCOUNTER — Ambulatory Visit (INDEPENDENT_AMBULATORY_CARE_PROVIDER_SITE_OTHER): Payer: BC Managed Care – PPO | Admitting: Urology

## 2024-02-07 VITALS — BP 127/82 | HR 96 | Ht 65.0 in | Wt 123.0 lb

## 2024-02-07 DIAGNOSIS — R3129 Other microscopic hematuria: Secondary | ICD-10-CM

## 2024-02-07 DIAGNOSIS — N200 Calculus of kidney: Secondary | ICD-10-CM | POA: Diagnosis not present

## 2024-02-07 DIAGNOSIS — N3281 Overactive bladder: Secondary | ICD-10-CM

## 2024-02-07 LAB — URINALYSIS, COMPLETE
Bilirubin, UA: NEGATIVE
Glucose, UA: NEGATIVE
Leukocytes,UA: NEGATIVE
Nitrite, UA: NEGATIVE
Protein,UA: NEGATIVE
Specific Gravity, UA: <1.005 — ABNORMAL LOW (ref 1.005–1.030)
Urobilinogen, Ur: 0.2 mg/dL (ref 0.2–1.0)
pH, UA: 5.5 (ref 5.0–7.5)

## 2024-02-07 LAB — MICROSCOPIC EXAMINATION: Bacteria, UA: NONE SEEN

## 2024-02-08 NOTE — Progress Notes (Signed)
   02/07/24  CC:  Chief Complaint  Patient presents with   Cysto    HPI: 53 year old female with a personal history of nephrolithiasis found to have microscopic hematuria.  She presents today for cystoscopy for further evaluation of this.  Blood pressure 127/82, pulse 96, height 5\' 5"  (1.651 m), weight 123 lb (55.8 kg), last menstrual period 02/03/2024. NED. A&Ox3.   No respiratory distress   Abd soft, NT, ND Normal external genitalia with patent urethral meatus  Cystoscopy Procedure Note  Patient identification was confirmed, informed consent was obtained, and patient was prepped using Betadine solution.  Lidocaine jelly was administered per urethral meatus.    Procedure: - Flexible cystoscope introduced, without any difficulty.   - Thorough search of the bladder revealed:    normal urethral meatus    normal urothelium    no stones    no ulcers     no tumors    no urethral polyps    no trabeculation  - Ureteral orifices were normal in position and appearance.  Post-Procedure: - Patient tolerated the procedure well  Assessment/ Plan:  1. Microscopic hematuria Cystoscopy today was unremarkable  2. Bilateral nephrolithiasis (Primary) Small-volume nonobstructing stones  Will plan to have her return in a year with a KUB or sooner as needed - Urinalysis, Complete - Abdomen 1 view (KUB); Future  3. OAB (overactive bladder) Continue Gemtesa  Follow-up with Carollee Herter in 1 year with KUB  Vanna Scotland, MD

## 2024-02-09 ENCOUNTER — Telehealth: Payer: Self-pay | Admitting: Pharmacist

## 2024-02-09 ENCOUNTER — Encounter: Payer: Self-pay | Admitting: Family Medicine

## 2024-02-09 ENCOUNTER — Encounter: Payer: Self-pay | Admitting: Family

## 2024-02-09 ENCOUNTER — Other Ambulatory Visit: Payer: Self-pay | Admitting: Family

## 2024-02-09 DIAGNOSIS — R928 Other abnormal and inconclusive findings on diagnostic imaging of breast: Secondary | ICD-10-CM

## 2024-02-09 NOTE — Telephone Encounter (Signed)
Sent mychart to pt letting her know.

## 2024-02-09 NOTE — Telephone Encounter (Signed)
 Appeal has been submitted and documented in separate encounter.

## 2024-02-09 NOTE — Telephone Encounter (Signed)
 Pt sent mychart stating she received notice this was denied. Do you have an update? She is wanting to urgently appeal

## 2024-02-09 NOTE — Telephone Encounter (Signed)
Please set up appt

## 2024-02-09 NOTE — Telephone Encounter (Signed)
 Pharmacy Patient Advocate Encounter  Information has been sent to clinical pharmacist for appeals review. It may take 5-7 days to prepare the necessary documentation to request the appeal from the insurance.   Received notification from [facility] that Prior Authorization for Emgality 120MG /ML auto-injectors has been DENIED.  See denial reason below. No denial letter attached in CMM. Will attach denial letter to Media tab once received.

## 2024-02-09 NOTE — Telephone Encounter (Signed)
 In order to submit an appeal for Emagility, we need clinical documentation as to why the patient can't try Aimovig.  Please see the insurance denial reason below.  We have already provided documentation about use of amitriptyline, topiramate and Ajovy.    Thank you, Dellie Burns, PharmD Clinical Pharmacist  [place]  Direct Dial: [phone]

## 2024-02-09 NOTE — Telephone Encounter (Signed)
 Urgent Appeal request has been submitted for Emgality. Will advise when response is received. Appeal letter and supporting documentation have been faxed to [phone] on 02/09/2024 @4 :22 pm.  Thank you, Dellie Burns, PharmD Clinical Pharmacist  [place]  Direct Dial: [phone]

## 2024-02-10 ENCOUNTER — Telehealth: Payer: Self-pay | Admitting: Pharmacist

## 2024-02-10 NOTE — Telephone Encounter (Signed)
 Additional information form received from insurance company.  Completed and faxed back to [phone] on 02/10/2024 @8 :06am.

## 2024-02-10 NOTE — Telephone Encounter (Signed)
 LVM for patient to c/b and schedule.

## 2024-02-10 NOTE — Telephone Encounter (Signed)
 Patient scheduled.

## 2024-02-13 ENCOUNTER — Other Ambulatory Visit (HOSPITAL_COMMUNITY): Payer: Self-pay

## 2024-02-13 NOTE — Telephone Encounter (Signed)
 Appeal for [employer] has been approved by [employer].

## 2024-02-14 ENCOUNTER — Encounter: Payer: Self-pay | Admitting: Family

## 2024-02-14 ENCOUNTER — Ambulatory Visit (INDEPENDENT_AMBULATORY_CARE_PROVIDER_SITE_OTHER): Payer: BC Managed Care – PPO | Admitting: Neurology

## 2024-02-14 ENCOUNTER — Encounter: Payer: Self-pay | Admitting: Neurology

## 2024-02-14 ENCOUNTER — Ambulatory Visit (INDEPENDENT_AMBULATORY_CARE_PROVIDER_SITE_OTHER): Admitting: Family

## 2024-02-14 VITALS — BP 109/68 | HR 89 | Ht 65.0 in | Wt 129.0 lb

## 2024-02-14 VITALS — BP 116/78 | HR 78 | Temp 97.9°F | Ht 65.0 in | Wt 128.0 lb

## 2024-02-14 DIAGNOSIS — R12 Heartburn: Secondary | ICD-10-CM | POA: Diagnosis not present

## 2024-02-14 DIAGNOSIS — G44011 Episodic cluster headache, intractable: Secondary | ICD-10-CM | POA: Insufficient documentation

## 2024-02-14 DIAGNOSIS — G43119 Migraine with aura, intractable, without status migrainosus: Secondary | ICD-10-CM

## 2024-02-14 DIAGNOSIS — R519 Headache, unspecified: Secondary | ICD-10-CM | POA: Insufficient documentation

## 2024-02-14 DIAGNOSIS — R10817 Generalized abdominal tenderness: Secondary | ICD-10-CM | POA: Insufficient documentation

## 2024-02-14 DIAGNOSIS — G629 Polyneuropathy, unspecified: Secondary | ICD-10-CM | POA: Diagnosis not present

## 2024-02-14 DIAGNOSIS — M62838 Other muscle spasm: Secondary | ICD-10-CM | POA: Insufficient documentation

## 2024-02-14 DIAGNOSIS — T781XXA Other adverse food reactions, not elsewhere classified, initial encounter: Secondary | ICD-10-CM | POA: Insufficient documentation

## 2024-02-14 DIAGNOSIS — R102 Pelvic and perineal pain: Secondary | ICD-10-CM | POA: Insufficient documentation

## 2024-02-14 DIAGNOSIS — G43711 Chronic migraine without aura, intractable, with status migrainosus: Secondary | ICD-10-CM

## 2024-02-14 DIAGNOSIS — K5904 Chronic idiopathic constipation: Secondary | ICD-10-CM | POA: Diagnosis not present

## 2024-02-14 LAB — CBC WITH DIFFERENTIAL/PLATELET
Basophils Absolute: 0 10*3/uL (ref 0.0–0.1)
Basophils Relative: 0.3 % (ref 0.0–3.0)
Eosinophils Absolute: 0.4 10*3/uL (ref 0.0–0.7)
Eosinophils Relative: 4.1 % (ref 0.0–5.0)
HCT: 41.3 % (ref 36.0–46.0)
Hemoglobin: 14 g/dL (ref 12.0–15.0)
Lymphocytes Relative: 27.5 % (ref 12.0–46.0)
Lymphs Abs: 2.4 10*3/uL (ref 0.7–4.0)
MCHC: 33.9 g/dL (ref 30.0–36.0)
MCV: 96.2 fl (ref 78.0–100.0)
Monocytes Absolute: 0.4 10*3/uL (ref 0.1–1.0)
Monocytes Relative: 4.7 % (ref 3.0–12.0)
Neutro Abs: 5.6 10*3/uL (ref 1.4–7.7)
Neutrophils Relative %: 63.4 % (ref 43.0–77.0)
Platelets: 264 10*3/uL (ref 150.0–400.0)
RBC: 4.29 Mil/uL (ref 3.87–5.11)
RDW: 13.1 % (ref 11.5–15.5)
WBC: 8.8 10*3/uL (ref 4.0–10.5)

## 2024-02-14 LAB — COMPREHENSIVE METABOLIC PANEL
ALT: 7 U/L (ref 0–35)
AST: 16 U/L (ref 0–37)
Albumin: 4.7 g/dL (ref 3.5–5.2)
Alkaline Phosphatase: 75 U/L (ref 39–117)
BUN: 14 mg/dL (ref 6–23)
CO2: 28 meq/L (ref 19–32)
Calcium: 9.8 mg/dL (ref 8.4–10.5)
Chloride: 107 meq/L (ref 96–112)
Creatinine, Ser: 1.04 mg/dL (ref 0.40–1.20)
GFR: 62.79 mL/min (ref >60.00)
Glucose, Bld: 103 mg/dL — ABNORMAL HIGH (ref 70–99)
Potassium: 4.2 meq/L (ref 3.5–5.1)
Sodium: 142 meq/L (ref 135–145)
Total Bilirubin: 0.7 mg/dL (ref 0.2–1.2)
Total Protein: 6.9 g/dL (ref 6.0–8.3)

## 2024-02-14 LAB — VITAMIN B12: Vitamin B-12: 1147 pg/mL — ABNORMAL HIGH (ref 211–911)

## 2024-02-14 LAB — AMYLASE: Amylase: 41 U/L (ref 27–131)

## 2024-02-14 LAB — LIPASE: Lipase: 34 U/L (ref 11.0–59.0)

## 2024-02-14 MED ORDER — TIZANIDINE HCL 4 MG PO TABS: ORAL_TABLET | ORAL | 0 refills | Status: DC

## 2024-02-14 MED ORDER — NURTEC 75 MG PO TBDP
75.0000 mg | ORAL_TABLET | ORAL | Status: DC | PRN
Start: 2024-02-14 — End: 2024-02-21

## 2024-02-14 MED ORDER — QULIPTA 60 MG PO TABS: 60.0000 mg | ORAL_TABLET | Freq: Once | ORAL | 0 refills | Status: DC | PRN

## 2024-02-14 NOTE — Patient Instructions (Signed)
 Atogepant Tablets What is this medication? ATOGEPANT (a TOE je pant) prevents migraines. It works by blocking a substance in the body that causes migraines. This medicine may be used for other purposes; ask your health care provider or pharmacist if you have questions. COMMON BRAND NAME(S): QULIPTA What should I tell my care team before I take this medication? They need to know if you have any of these conditions: Kidney disease Liver disease An unusual or allergic reaction to atogepant, other medications, foods, dyes, or preservatives Pregnant or trying to get pregnant Breast-feeding How should I use this medication? Take this medication by mouth with water. Take it as directed on the prescription label at the same time every day. You can take it with or without food. If it upsets your stomach, take it with food. Keep taking it unless your care team tells you to stop. Talk to your care team about the use of this medication in children. Special care may be needed. Overdosage: If you think you have taken too much of this medicine contact a poison control center or emergency room at once. NOTE: This medicine is only for you. Do not share this medicine with others. What if I miss a dose? If you miss a dose, take it as soon as you can. If it is almost time for your next dose, take only that dose. Do not take double or extra doses. What may interact with this medication? Carbamazepine Certain medications for fungal infections, such as itraconazole, ketoconazole Clarithromycin Cyclosporine Efavirenz Etravirine Phenytoin Rifampin St. John's wort This list may not describe all possible interactions. Give your health care provider a list of all the medicines, herbs, non-prescription drugs, or dietary supplements you use. Also tell them if you smoke, drink alcohol, or use illegal drugs. Some items may interact with your medicine. What should I watch for while using this medication? Visit  your care team for regular checks on your progress. Tell your care team if your symptoms do not start to get better or if they get worse. What side effects may I notice from receiving this medication? Side effects that you should report to your care team as soon as possible: Allergic reactions--skin rash, itching, hives, swelling of the face, lips, tongue, or throat Side effects that usually do not require medical attention (report to your care team if they continue or are bothersome): Constipation Fatigue Loss of appetite with weight loss Nausea This list may not describe all possible side effects. Call your doctor for medical advice about side effects. You may report side effects to FDA at 1-800-FDA-1088. Where should I keep my medication? Keep out of the reach of children and pets. Store at room temperature between 20 and 25 degrees C (68 and 77 degrees F). Get rid of any unused medication after the expiration date. To get rid of medications that are no longer needed or have expired: Take the medication to a medication take-back program. Check with your pharmacy or law enforcement to find a location. If you cannot return the medication, check the label or package insert to see if the medication should be thrown out in the garbage or flushed down the toilet. If you are not sure, ask your care team. If it is safe to put it in the trash, take the medication out of the container. Mix the medication with cat litter, dirt, coffee grounds, or other unwanted substance. Seal the mixture in a bag or container. Put it in the trash. NOTE: This  sheet is a summary. It may not cover all possible information. If you have questions about this medicine, talk to your doctor, pharmacist, or health care provider.  2024 Elsevier/Gold Standard (2022-01-04 00:00:00)Rimegepant Disintegrating Tablets What is this medication? RIMEGEPANT (ri ME je pant) prevents and treats migraines. It works by blocking a substance in  the body that causes migraines. This medicine may be used for other purposes; ask your health care provider or pharmacist if you have questions. COMMON BRAND NAME(S): NURTEC ODT What should I tell my care team before I take this medication? They need to know if you have any of these conditions: Kidney disease Liver disease An unusual or allergic reaction to rimegepant, other medications, foods, dyes, or preservatives Pregnant or trying to get pregnant Breast-feeding How should I use this medication? Take this medication by mouth. Take it as directed on the prescription label. Leave the tablet in the sealed pack until you are ready to take it. With dry hands, open the pack and gently remove the tablet. If the tablet breaks or crumbles, throw it away. Use a new tablet. Place the tablet in the mouth and allow it to dissolve. Then, swallow it. Do not cut, crush, or chew this medication. You do not need water to take this medication. Talk to your care team about the use of this medication in children. Special care may be needed. Overdosage: If you think you have taken too much of this medicine contact a poison control center or emergency room at once. NOTE: This medicine is only for you. Do not share this medicine with others. What if I miss a dose? This does not apply. This medication is not for regular use. What may interact with this medication? Certain medications for fungal infections, such as fluconazole, itraconazole Rifampin This list may not describe all possible interactions. Give your health care provider a list of all the medicines, herbs, non-prescription drugs, or dietary supplements you use. Also tell them if you smoke, drink alcohol, or use illegal drugs. Some items may interact with your medicine. What should I watch for while using this medication? Visit your care team for regular checks on your progress. Tell your care team if your symptoms do not start to get better or if they  get worse. What side effects may I notice from receiving this medication? Side effects that you should report to your care team as soon as possible: Allergic reactions--skin rash, itching, hives, swelling of the face, lips, tongue, or throat Side effects that usually do not require medical attention (report to your care team if they continue or are bothersome): Nausea Stomach pain This list may not describe all possible side effects. Call your doctor for medical advice about side effects. You may report side effects to FDA at 1-800-FDA-1088. Where should I keep my medication? Keep out of the reach of children and pets. Store at room temperature between 20 and 25 degrees C (68 and 77 degrees F). Get rid of any unused medication after the expiration date. To get rid of medications that are no longer needed or have expired: Take the medication to a medication take-back program. Check with your pharmacy or law enforcement to find a location. If you cannot return the medication, check the label or package insert to see if the medication should be thrown out in the garbage or flushed down the toilet. If you are not sure, ask your care team. If it is safe to put it in the trash, take the  medication out of the container. Mix the medication with cat litter, dirt, coffee grounds, or other unwanted substance. Seal the mixture in a bag or container. Put it in the trash. NOTE: This sheet is a summary. It may not cover all possible information. If you have questions about this medicine, talk to your doctor, pharmacist, or health care provider.  2024 Elsevier/Gold Standard (2022-01-06 00:00:00)Chronic Migraine Headache A migraine headache is throbbing pain that is usually on one side of the head. It can also cause other symptoms. A migraine is called a chronic migraine if it happens at least 15 days in a month for more than 3 months. Talk with your doctor about what things may bring on (trigger) your  migraines. What are the causes? The exact cause of a migraine is not known. This condition may be brought on or caused by: Smoking. Some medicines, such as birth control or some blood pressure medicines. Certain substances in some foods or drinks. Foods and drinks, such as: Cheese. Chocolate. Alcohol. Caffeine. Other things that may bring on a migraine include: Periods. Stress. Getting too much or too little sleep. Feeling tired (fatigue). Bright lights or loud noises. Certain smells. Weather changes and being at high altitude. What increases the risk? The following factors may make you more likely to have chronic migraines: Having migraines or family members who have them. Having a mental health condition, such as being sad (depressed) or feeling worried or nervous (anxious). Taking a lot of pain medicine. Having sleep problems. Having heart disease, diabetes, or being very overweight (obese). What are the signs or symptoms? Symptoms vary for each person. The pain may: Feel like it is pulsing or throbbing. Happen only on one side of the head. In some cases, the pain may be on both sides of the head or around the head or neck. Be so bad that it keeps you from doing daily activities. Get worse with activity. Make you feel like you may vomit (feel nauseous) or vomit. Get worse around bright lights, loud noises, or smells. Cause you to feel dizzy. A sign that you may have chronic migraines is when you start to have more and more migraines. How is this treated? This condition is treated with medicines that: Lessen pain and the feeling like you may vomit. Prevent migraines. Treatment may also include: Acupuncture. Changes to your diet or sleep. Relaxation training. Learning ways to control your body and breathing (biofeedback). Talk therapy to help you know and deal with negative thoughts (cognitive behavioral therapy). Using a device that gives electrical stimulation to your  nerves, which can help take away pain. A shot of medicine into the muscles of the face or head. Surgery, if the other treatments do not work. Follow these instructions at home: Medicines Take over-the-counter and prescription medicines only as told by your doctor. Ask your doctor if the medicine prescribed to you requires you to avoid driving or using machinery. Lifestyle  Do not drink alcohol. Do not smoke or use any products that contain nicotine or tobacco. If you need help quitting, ask your doctor. Get 7-9 hours of sleep every night, or the amount of sleep recommended by your doctor. Lower the stress in your life. Ask your doctor about ways to do this. Stay at a healthy weight. Talk with your doctor if you need help losing weight. Get regular exercise. General instructions Keep a journal to find out if certain things bring on migraines. This can help you avoid those things. For example,  write down: What you eat and drink. How much sleep you get. Any change to your diet or medicines. Lie down in a dark, quiet room when you have a migraine. Try placing a cool towel over your head when you have a migraine. Keep lights dim if bright lights bother you or make your migraines worse. Where to find more information Coalition for Headache and Migraine Patients (CHAMP): headachemigraine.org American Migraine Foundation: americanmigrainefoundation.org National Headache Foundation: headaches.org Contact a doctor if: Medicine does not help your migraine. Your pain keeps coming back even with medicine. Get help right away if: Your migraine becomes really bad and medicine does not help. You have a fever or stiff neck. You have trouble seeing. Your muscles are weak or you lose control of them. You lose your balance or have trouble walking. You feel like you may faint or you faint. You start having sudden, very bad headaches. You have a seizure. This information is not intended to replace  advice given to you by your health care provider. Make sure you discuss any questions you have with your health care provider. Document Revised: 07/12/2022 Document Reviewed: 07/12/2022 Elsevier Patient Education  2024 [employer].

## 2024-02-14 NOTE — Patient Instructions (Signed)
  I have sent in your order electronically for the following: transvaginal ultrasound and also abdominal ultrasound  at this location below. Please call to schedule the appointment at your convenience  [facility] outpatient imaging center off [address], [place] [address] Phone [phone]-  8-5 pm

## 2024-02-14 NOTE — Assessment & Plan Note (Signed)
 R/o alpha gal and also h pylori Trial dairy free diet x 2 weeks to see if any improvement.

## 2024-02-14 NOTE — Assessment & Plan Note (Signed)
 Ddx pancreatitis, cholecystitis, appendicitis, diverticulitis (low suspicion) Amylase lipase cmp cbc ordered pending results.

## 2024-02-14 NOTE — Assessment & Plan Note (Signed)
 U/s transvaginal u/s  Ddx ovarian cysts, uterine fibroids, neoplasm

## 2024-02-14 NOTE — Progress Notes (Signed)
 Established Patient Office Visit  Subjective:      CC:  Chief Complaint  Patient presents with   Medical Management of Chronic Issues    HPI: Jordan Zimmerman is a 53 y.o. female presenting on 02/14/2024 for Medical Management of Chronic Issues . Heartburn, had tried trial omeprazole 20 mg once daily   New complaints: For the past few years, ongoing left sided rib pain in the back side. 'nothing makes it feel more comfortable' it is pretty frequently but in the past few months has worsened. She did have epigastric pain, was given omeprazole and she states that there was great improvement with those symptoms. Alternating constipation with normal bowel movements. Nausea daily not vomiting. She does notice if she eats too much protein it gives her a gut issue, mostly with red meat. Feels bloated often. Colonoscopy 07/21/21 negative Q 10 y. She is completely gluten free, has been GF for four years. She does eat dairy rarely, has to limit it.   Persistent headache: she is seen by neurology, she does have a sleep study scheduled for today. She is on propanolol 60 mg once daily, rizatriptan 10 mg and zofran prn. Stopped emgality and switched to ajovy but hasn't been able to start yet due to insurance coverage. She is awaiting call from neurology.   Post nasal drip: constant. On zyrtec here and there, not currently on this right now.   Neck with radiation of pain down bil hands sometimes tips of fingers can feel numb. Does feel tightness in back of neck.    Social history:  Relevant past medical, surgical, family and social history reviewed and updated as indicated. Interim medical history since our last visit reviewed.  Allergies and medications reviewed and updated.  DATA REVIEWED: CHART IN EPIC     ROS: Negative unless specifically indicated above in HPI.    Current Outpatient Medications:    levonorgestrel (MIRENA) 20 MCG/DAY IUD, 1 each by Intrauterine route once., Disp: , Rfl:     omeprazole (PRILOSEC) 20 MG capsule, Take 1 capsule (20 mg total) by mouth daily., Disp: 30 capsule, Rfl: 3   ondansetron (ZOFRAN) 4 MG tablet, Take 1 tablet (4 mg total) by mouth every 8 (eight) hours as needed for nausea or vomiting., Disp: 20 tablet, Rfl: 2   propranolol ER (INDERAL LA) 60 MG 24 hr capsule, Take 1 capsule (60 mg total) by mouth daily., Disp: 90 capsule, Rfl: 3   rizatriptan (MAXALT-MLT) 10 MG disintegrating tablet, Take 1 tablet (10 mg total) by mouth as needed for migraine. May repeat in 2 hours if needed, Disp: 9 tablet, Rfl: 11   tiZANidine (ZANAFLEX) 4 MG tablet, Take 1/2 to one po at bedtime prn muscle spasm, Disp: 30 tablet, Rfl: 0   Vibegron (GEMTESA) 75 MG TABS, Take 1 tablet (75 mg total) by mouth daily., Disp: 30 tablet, Rfl: 11      Objective:    BP 116/78 (BP Location: Left Arm, Patient Position: Sitting, Cuff Size: Normal)   Pulse 78   Temp 97.9 F (36.6 C) (Temporal)   Ht 5\' 5"  (1.651 m)   Wt 128 lb (58.1 kg)   LMP 02/03/2024   SpO2 98%   BMI 21.30 kg/m   Wt Readings from Last 3 Encounters:  02/14/24 128 lb (58.1 kg)  02/07/24 123 lb (55.8 kg)  01/17/24 133 lb (60.3 kg)    Physical Exam Constitutional:      General: She is not in acute distress.  Appearance: Normal appearance. She is normal weight. She is not ill-appearing, toxic-appearing or diaphoretic.  HENT:     Head: Normocephalic.  Cardiovascular:     Rate and Rhythm: Normal rate and regular rhythm.  Pulmonary:     Effort: Pulmonary effort is normal.     Breath sounds: Normal breath sounds.  Abdominal:     General: Bowel sounds are normal.     Tenderness: There is abdominal tenderness in the right upper quadrant and right lower quadrant. Negative signs include Rovsing's sign, McBurney's sign and psoas sign. Murphy's sign: slight.    Hernia: No hernia is present.  Musculoskeletal:        General: Normal range of motion.     Cervical back: Full passive range of motion without pain.  Muscular tenderness present. No pain with movement. Normal range of motion.  Neurological:     General: No focal deficit present.     Mental Status: She is alert and oriented to person, place, and time. Mental status is at baseline.  Psychiatric:        Mood and Affect: Mood normal.        Behavior: Behavior normal.        Thought Content: Thought content normal.        Judgment: Judgment normal.           Assessment & Plan:  Intractable migraine with aura without status migrainosus  Generalized abdominal tenderness without rebound tenderness Assessment & Plan: Ddx pancreatitis, cholecystitis, appendicitis, diverticulitis (low suspicion) Amylase lipase cmp cbc ordered pending results.    Orders: -     US Abdomen Complete; Future -     Amylase -     Lipase -     CBC with Differential/Platelet -     Comprehensive metabolic panel  Pain, pelvic, female Assessment & Plan: U/s transvaginal u/s  Ddx ovarian cysts, uterine fibroids, neoplasm  Orders: -     US PELVIC COMPLETE WITH TRANSVAGINAL; Future -     Urine Culture  Chronic idiopathic constipation  Gastrointestinal food sensitivity Assessment & Plan: R/o alpha gal and also h pylori Trial dairy free diet x 2 weeks to see if any improvement.    Orders: -     Alpha-Gal Panel -     H. pylori breath test -     CALPROTECTIN  Heartburn -     Alpha-Gal Panel -     H. pylori breath test -     CALPROTECTIN  Neuropathy -     Vitamin B12  Muscle spasm Assessment & Plan: Neck tightness Work on neck exercises sent to [facility] [facility], tylenol prn if no improvement will consider cervical spine xray vs physical therapy   Orders: -     tiZANidine HCl; Take 1/2 to one po at bedtime prn muscle spasm  Dispense: 30 tablet; Refill: 0     Return in about 1 month (around 03/16/2024) for f/u abdominal pain .  Mort Sawyers, MSN, APRN, FNP-C [facility]

## 2024-02-14 NOTE — Assessment & Plan Note (Signed)
 Neck tightness Work on neck exercises sent to [facility] [employer], tylenol prn if no improvement will consider cervical spine xray vs physical therapy

## 2024-02-14 NOTE — Addendum Note (Signed)
 Addended by: Lovena Neighbours on: 02/14/2024 01:37 PM   Modules accepted: Orders

## 2024-02-14 NOTE — Addendum Note (Signed)
 Addended by: Melvyn Novas on: 02/14/2024 04:59 PM   Modules accepted: Orders
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
## 2024-02-15 ENCOUNTER — Encounter: Payer: Self-pay | Admitting: Family

## 2024-02-15 LAB — URINE CULTURE
MICRO NUMBER:: 16215356
Result:: NO GROWTH
SPECIMEN QUALITY:: ADEQUATE

## 2024-02-16 ENCOUNTER — Ambulatory Visit
Admission: RE | Admit: 2024-02-16 | Discharge: 2024-02-16 | Disposition: A | Discharge status: Home / Self Care | Source: Ambulatory Visit | Attending: Family | Admitting: Family

## 2024-02-16 DIAGNOSIS — N132 Hydronephrosis with renal and ureteral calculous obstruction: Secondary | ICD-10-CM | POA: Diagnosis not present

## 2024-02-16 DIAGNOSIS — R1012 Left upper quadrant pain: Secondary | ICD-10-CM | POA: Diagnosis not present

## 2024-02-16 DIAGNOSIS — R10817 Generalized abdominal tenderness: Secondary | ICD-10-CM | POA: Diagnosis not present

## 2024-02-16 DIAGNOSIS — R102 Pelvic and perineal pain: Secondary | ICD-10-CM | POA: Insufficient documentation

## 2024-02-16 DIAGNOSIS — R1031 Right lower quadrant pain: Secondary | ICD-10-CM | POA: Diagnosis not present

## 2024-02-16 DIAGNOSIS — R1011 Right upper quadrant pain: Secondary | ICD-10-CM | POA: Diagnosis not present

## 2024-02-16 LAB — H. PYLORI BREATH TEST: H. pylori Breath Test: NOT DETECTED

## 2024-02-17 ENCOUNTER — Other Ambulatory Visit: Payer: Self-pay | Admitting: Family

## 2024-02-17 ENCOUNTER — Encounter: Payer: Self-pay | Admitting: Family

## 2024-02-17 ENCOUNTER — Other Ambulatory Visit: Payer: Self-pay

## 2024-02-17 ENCOUNTER — Encounter: Payer: Self-pay | Admitting: Neurology

## 2024-02-17 DIAGNOSIS — R12 Heartburn: Secondary | ICD-10-CM

## 2024-02-17 DIAGNOSIS — N200 Calculus of kidney: Secondary | ICD-10-CM

## 2024-02-17 DIAGNOSIS — N1330 Unspecified hydronephrosis: Secondary | ICD-10-CM

## 2024-02-17 DIAGNOSIS — T781XXA Other adverse food reactions, not elsewhere classified, initial encounter: Secondary | ICD-10-CM | POA: Diagnosis not present

## 2024-02-18 LAB — ALPHA-GAL PANEL
Allergen, Mutton, f88: <0.1 kU/L
Allergen, Pork, f26: <0.1 kU/L
Beef: <0.1 kU/L
CLASS: 0
CLASS: 0
Class: 0
GALACTOSE-ALPHA-1,3-GALACTOSE IGE*: 0.26 kU/L — ABNORMAL HIGH (ref <0.10)

## 2024-02-18 LAB — INTERPRETATION:

## 2024-02-21 ENCOUNTER — Telehealth: Payer: Self-pay | Admitting: *Deleted

## 2024-02-21 MED ORDER — EMGALITY 120 MG/ML ~~LOC~~ SOAJ: 120.0000 mg | SUBCUTANEOUS | 3 refills | Status: DC

## 2024-02-21 MED ORDER — NURTEC 75 MG PO TBDP: 75.0000 mg | ORAL_TABLET | ORAL | 5 refills | Status: DC | PRN

## 2024-02-21 NOTE — Telephone Encounter (Signed)
 Received fax from covermymeds requesting PA be completed for Nurtec. Key: [id number].

## 2024-02-21 NOTE — Telephone Encounter (Signed)
 Called Pt to discuss her concerns further. LVM for Pt to call back.

## 2024-02-21 NOTE — Telephone Encounter (Signed)
 Spoke w/Pt regarding concerns of medications. Discussed concerns for drowsiness, Pt stated still not sleeping well. Pt educated on use of Nurtec and how it should be taken. Pt stated understanding. Pt stated she has had relief using Nurtec. Discussed Qulipta, Pt stated did have bad nausea when she used previously, discussed it best if she does not take Qulipta at this time. Discussed Emgality and that insurance had approved until Sept 2025. Pt stated her pharmacy had not previously notified her the script was ready. Educated Pt on use and dosing of Emgality. Pt stated ready to try medication. Script was again sent to pharmacy for [facility], as well as script for Nurtec. Pt also stated she is still taking propanolol. Informed Pt sleep lab will be contacting to arrange home sleep study. Advised Pt to reach out with any further questions or concerns. Pt stated thankful for call.

## 2024-02-21 NOTE — Addendum Note (Signed)
 Addended by: Mariea Stable on: 02/21/2024 09:13 AM   Modules accepted: Orders

## 2024-02-21 NOTE — Telephone Encounter (Signed)
Pt returned phone call  and would like a call back. 

## 2024-02-22 ENCOUNTER — Encounter: Payer: Self-pay | Admitting: Family

## 2024-02-22 ENCOUNTER — Telehealth: Payer: Self-pay

## 2024-02-22 DIAGNOSIS — Z975 Presence of (intrauterine) contraceptive device: Secondary | ICD-10-CM

## 2024-02-22 NOTE — Telephone Encounter (Signed)
*  [facility]  Pharmacy Patient Advocate Encounter   Received notification from Pt Calls Messages that prior authorization for Nurtec 75MG  dispersible tablets  is required/requested.   Insurance verification completed.   The patient is insured through [facility] .   Per test claim: PA required; PA submitted to above mentioned insurance via CoverMyMeds Key/confirmation #/EOC [id number] Status is pending

## 2024-02-22 NOTE — Telephone Encounter (Signed)
 Pharmacy Patient Advocate Encounter  Received notification from [facility] that Prior Authorization for Nurtec 75MG  dispersible tablet has been APPROVED from 02/22/2024 to 05/24/2024

## 2024-02-22 NOTE — Telephone Encounter (Signed)
 PA request has been Submitted. New Encounter has been or will be created for follow up. For additional info see Pharmacy Prior Auth telephone encounter from 03/26.

## 2024-02-23 DIAGNOSIS — Z975 Presence of (intrauterine) contraceptive device: Secondary | ICD-10-CM | POA: Insufficient documentation

## 2024-02-24 LAB — CALPROTECTIN: Calprotectin: 7 ug/g

## 2024-02-27 ENCOUNTER — Encounter: Payer: Self-pay | Admitting: Family

## 2024-02-28 ENCOUNTER — Ambulatory Visit

## 2024-02-28 ENCOUNTER — Ambulatory Visit: Admission: RE | Admit: 2024-02-28 | Source: Ambulatory Visit

## 2024-02-28 ENCOUNTER — Ambulatory Visit
Admission: RE | Admit: 2024-02-28 | Discharge: 2024-02-28 | Disposition: A | Discharge status: Home / Self Care | Source: Ambulatory Visit | Attending: Family | Admitting: Family

## 2024-02-28 DIAGNOSIS — R928 Other abnormal and inconclusive findings on diagnostic imaging of breast: Secondary | ICD-10-CM

## 2024-02-29 ENCOUNTER — Encounter: Payer: Self-pay | Admitting: Family

## 2024-03-04 ENCOUNTER — Other Ambulatory Visit: Payer: Self-pay | Admitting: Family

## 2024-03-04 DIAGNOSIS — R1013 Epigastric pain: Secondary | ICD-10-CM

## 2024-03-05 ENCOUNTER — Encounter

## 2024-03-16 ENCOUNTER — Ambulatory Visit: Admitting: Neurology

## 2024-03-16 DIAGNOSIS — G44011 Episodic cluster headache, intractable: Secondary | ICD-10-CM

## 2024-03-16 DIAGNOSIS — R519 Headache, unspecified: Secondary | ICD-10-CM

## 2024-03-16 DIAGNOSIS — G43711 Chronic migraine without aura, intractable, with status migrainosus: Secondary | ICD-10-CM

## 2024-03-16 DIAGNOSIS — G4733 Obstructive sleep apnea (adult) (pediatric): Secondary | ICD-10-CM

## 2024-03-18 ENCOUNTER — Other Ambulatory Visit: Payer: Self-pay | Admitting: Family

## 2024-03-18 DIAGNOSIS — M62838 Other muscle spasm: Secondary | ICD-10-CM

## 2024-03-28 NOTE — Progress Notes (Signed)
 [facility]  Jordan Zimmerman 53 year old female [birth year]   HOME SLEEP TEST REPORT ( by Katheen Palma  mail -out device )   STUDY DATE:  03-16-2024, return of data on 03-28-2024     ORDERING CLINICIAN: Amy Lomax, NP, formerly patient of Dr Billy Bue.     Neomia Banner, MD  REFERRING CLINICIAN:     CLINICAL INFORMATION/HISTORY:  Jordan Zimmerman is a 53 y.o. female patient , formerly of Dr Billy Bue and seen here upon referral from [facility] NP Lomax for a Sleep consultation but this may result in a TOC from Dr Billy Bue, if needing an assigned attending neurologist.   My collague Dr Godwin Lat had helped to fill medications in the interval time but apparently her insurance did not approve EMGALITY > she had failed Ajovy , there was no change in headaches/ migraines under that medication. She reports a migraine HA every morning. Rarely has she been woken by headaches but it has happened.     She has poor sleep for decades- and she reports she sometimes snores- not frequently. Her sleep quality is poor and sleep is fragmented. She may take an hour nap in the early afternoon but estimated her total sleep time to be about 4-5 hours only ( per 24 hours ).   She is pleasant and cooperative. She estimated 30 migraines a months. No aura.  Medical co-morbidities: fibromyalgia, depression, kidney stones   The patient presents for evaluation of headaches which began several years ago. The past month her migraines have been more severe. She injured her back around this time and thinks this may have triggered the worsening of her headaches. Service dog also passed away around this time. They have been slightly better this past week. Prior to that would have a migraine every other day. She does have lower level headaches on the days she does not have migraines. Migraines are described as frontal throbbing with associated photophobia, phonophobia, nausea, and vomiting.    01/17/24 ALL:  Jordan Zimmerman returns for follow up for  migraines. She was last seen 06/2023 and reported nausea on Qulipta . We switched her to Ajovy  and continued propranolol . Rizatritpan started and ondansetron  continued for abortive therapy. Since, she reports continued headaches. She initially felt about 50% improvement headache intensity and frequency but recently feels it is no longer working. She is having daily headaches. She wakes with headaches most every day. Throbbing/pounding. Light sensitivity and nausea. Rizatriptan  and ondansetron  help. BP is usually normal. She has not been able to find any particular triggers. She reports husband has told her she wakes her self up at night making snoring sounds but she doesn't remember this. No family history of sleep apnea. She does not smoke. Hx kidney stones    Epworth sleepiness score: 5 24 and / FSS 40/ 63 ,  no depression score was obtained.  BMI: 21.5  kg/m Neck Circumference: 13"   FINDINGS: Sleep Summary:   Total Recording Time (hours, min):     9 hours 9 minutes   Total Sleep Time (hours, min): 7 hours 19 minutes Sleep efficiency %;    80%, following a sleep latency of 8 minutes.                                   Respiratory Indices: by AASM :   Calculated pAHI (per hour):   7.2/h  Positional  respiratory activity  / snoring : Intermittent brief snoring spells recorded in right lateral and some in prone sleep position.  Oxygen Saturation  in Sleep    Oxygen Saturation (%) Mean:    Mean sleep oxygenation was 95.9% and varied between 89% at nadir and a maximal saturation of 100%.  There was less than 1 minute of total desaturation time, defined as oxygen saturation below 89%                     Pulse Rate in Sleep :   Pulse Mean (bpm):   Pulse varied between 54 and 92 bpm in normal sinus rhythm.  Mean heart rate 67 bpm                           IMPRESSION:  This HST confirms the presence of very mild sleep apnea ( OSA purely ay AHI of  7.2/h) , while sleeping in lateral and prone sleep position.   RECOMMENDATION: I cannot correlate this patient's home sleep test results to her reported morning headache or headache frequency in the morning.  Causality for sleep-related headaches is usually hypoxia, sleep hypoxemia, and this was not present.  Apnea of this mild degree would usually be treated with a dental device rather than with CPAP.  Since the AHI total is 7.2/h I will leave CPAP as an option to this patient but she would not have to follow-up with me for sleep medicine care if she chooses to forego CPAP.  Cc Amy Lomax, NP      INTERPRETING PHYSICIAN:   Neomia Banner, MD  [facility] and [facility] Board certified by The [employer] of Sleep Medicine and Diplomate of the [employer] of Sleep Medicine. Board certified In Neurology through the ABPN, Fellow of the [employer] of Neurology.

## 2024-03-29 ENCOUNTER — Encounter: Payer: Self-pay | Admitting: Family

## 2024-03-29 DIAGNOSIS — E8809 Other disorders of plasma-protein metabolism, not elsewhere classified: Secondary | ICD-10-CM

## 2024-03-29 DIAGNOSIS — F418 Other specified anxiety disorders: Secondary | ICD-10-CM

## 2024-03-30 MED ORDER — EPINEPHRINE 0.3 MG/0.3ML IJ SOAJ: 0.3000 mg | INTRAMUSCULAR | 0 refills | Status: DC | PRN

## 2024-04-03 DIAGNOSIS — F4311 Post-traumatic stress disorder, acute: Secondary | ICD-10-CM | POA: Diagnosis not present

## 2024-04-04 ENCOUNTER — Encounter: Payer: Self-pay | Admitting: Neurology

## 2024-04-04 NOTE — Procedures (Signed)
 [facility]  Jordan Zimmerman 53 year old female 1971/04/25   HOME SLEEP TEST REPORT ( by Katheen Palma  mail -out device )   STUDY DATE:  03-16-2024, return of data on 03-28-2024     ORDERING CLINICIAN: Amy Lomax, NP, formerly patient of Dr Billy Bue.     Jordan Banner, MD  REFERRING CLINICIAN:     CLINICAL INFORMATION/HISTORY:  Jordan Zimmerman is a 53 y.o. female patient , formerly of Dr Billy Bue and seen here upon referral from [facility] NP Lomax for a Sleep consultation but this may result in a TOC from Dr Billy Bue, if needing an assigned attending neurologist.   My collague Dr Godwin Lat had helped to fill medications in the interval time but apparently her insurance did not approve EMGALITY > she had failed Ajovy , there was no change in headaches/ migraines under that medication. She reports a migraine HA every morning. Rarely has she been woken by headaches but it has happened.     She has poor sleep for decades- and she reports she sometimes snores- not frequently. Her sleep quality is poor and sleep is fragmented. She may take an hour nap in the early afternoon but estimated her total sleep time to be about 4-5 hours only ( per 24 hours ).   She is pleasant and cooperative. She estimated 30 migraines a months. No aura.  Medical co-morbidities: fibromyalgia, depression, kidney stones   The patient presents for evaluation of headaches which began several years ago. The past month her migraines have been more severe. She injured her back around this time and thinks this may have triggered the worsening of her headaches. Service dog also passed away around this time. They have been slightly better this past week. Prior to that would have a migraine every other day. She does have lower level headaches on the days she does not have migraines. Migraines are described as frontal throbbing with associated photophobia, phonophobia, nausea, and vomiting.    01/17/24 ALL:  Jordan Zimmerman returns for follow up for  migraines. She was last seen 06/2023 and reported nausea on Qulipta . We switched her to Ajovy  and continued propranolol . Rizatritpan started and ondansetron  continued for abortive therapy. Since, she reports continued headaches. She initially felt about 50% improvement headache intensity and frequency but recently feels it is no longer working. She is having daily headaches. She wakes with headaches most every day. Throbbing/pounding. Light sensitivity and nausea. Rizatriptan  and ondansetron  help. BP is usually normal. She has not been able to find any particular triggers. She reports husband has told her she wakes her self up at night making snoring sounds but she doesn't remember this. No family history of sleep apnea. She does not smoke. Hx kidney stones    Epworth sleepiness score: 5 24 and / FSS 40/ 63 ,  no depression score was obtained.  BMI: 21.5  kg/m Neck Circumference: 13"   FINDINGS: Sleep Summary:   Total Recording Time (hours, min):     9 hours 9 minutes   Total Sleep Time (hours, min): 7 hours 19 minutes Sleep efficiency %;    80%, following a sleep latency of 8 minutes.                                   Respiratory Indices: by AASM :   Calculated pAHI (per hour):   7.2/h  Positional  respiratory activity  / snoring : Intermittent brief snoring spells recorded in right lateral and some in prone sleep position.  Oxygen Saturation  in Sleep    Oxygen Saturation (%) Mean:    Mean sleep oxygenation was 95.9% and varied between 89% at nadir and a maximal saturation of 100%.  There was less than 1 minute of total desaturation time, defined as oxygen saturation below 89%                     Pulse Rate in Sleep :   Pulse Mean (bpm):   Pulse varied between 54 and 92 bpm in normal sinus rhythm.  Mean heart rate 67 bpm                           IMPRESSION:  This HST confirms the presence of very mild sleep apnea ( OSA purely ay AHI of  7.2/h) , while sleeping in lateral and prone sleep position.   RECOMMENDATION: I cannot correlate this patient's home sleep test results to her reported morning headache or headache frequency in the morning.  Causality for sleep-related headaches is usually hypoxia, sleep hypoxemia, and this was not present.  Apnea of this mild degree would usually be treated with a dental device rather than with CPAP.  Since the AHI total is 7.2/h I will leave CPAP as an option to this patient but she would not have to follow-up with me for sleep medicine care if she chooses to forego CPAP.  Cc Amy Lomax, NP      INTERPRETING PHYSICIAN:   Jordan Banner, MD  [facility] Board certified by The [employer] of Sleep Medicine and Diplomate of the [employer] of Sleep Medicine. Board certified In Neurology through the ABPN, Fellow of the [employer] of Neu[employer].

## 2024-04-05 ENCOUNTER — Encounter: Payer: Self-pay | Admitting: Family Medicine

## 2024-04-06 DIAGNOSIS — F4311 Post-traumatic stress disorder, acute: Secondary | ICD-10-CM | POA: Diagnosis not present

## 2024-04-06 NOTE — Telephone Encounter (Signed)
Please see the MyChart message reply(ies) for my assessment and plan.  The patient gave consent for this Medical Advice Message and is aware that it may result in a bill to their insurance company as well as the possibility that this may result in a co-payment or deductible. They are an established patient, but are not seeking medical advice exclusively about a problem treated during an in person or video visit in the last 7 days. I did not recommend an in person or video visit within 7 days of my reply.  I spent a total of 2 minutes cumulative time within 7 days through [employer] Mort Sawyers, FNP

## 2024-04-09 NOTE — Telephone Encounter (Signed)
 Please see if pt can schedule an office visit, can be video visit if easiest. This way we can talk through several options and get a feel of what to expect side effects/ etc

## 2024-04-10 DIAGNOSIS — F4311 Post-traumatic stress disorder, acute: Secondary | ICD-10-CM | POA: Diagnosis not present

## 2024-04-10 NOTE — Telephone Encounter (Signed)
 lvm for pt to call office to schedule appt.

## 2024-04-11 ENCOUNTER — Encounter: Payer: Self-pay | Admitting: Family

## 2024-04-11 ENCOUNTER — Ambulatory Visit (INDEPENDENT_AMBULATORY_CARE_PROVIDER_SITE_OTHER): Admitting: Family

## 2024-04-11 VITALS — BP 102/66 | HR 78 | Temp 97.7°F | Ht 65.0 in | Wt 128.0 lb

## 2024-04-11 DIAGNOSIS — F418 Other specified anxiety disorders: Secondary | ICD-10-CM

## 2024-04-11 DIAGNOSIS — R41840 Attention and concentration deficit: Secondary | ICD-10-CM | POA: Diagnosis not present

## 2024-04-11 DIAGNOSIS — G4733 Obstructive sleep apnea (adult) (pediatric): Secondary | ICD-10-CM | POA: Insufficient documentation

## 2024-04-11 DIAGNOSIS — J301 Allergic rhinitis due to pollen: Secondary | ICD-10-CM | POA: Insufficient documentation

## 2024-04-11 DIAGNOSIS — F458 Other somatoform disorders: Secondary | ICD-10-CM | POA: Diagnosis not present

## 2024-04-11 DIAGNOSIS — R519 Headache, unspecified: Secondary | ICD-10-CM

## 2024-04-11 MED ORDER — HYDROXYZINE HCL 25 MG PO TABS: ORAL_TABLET | ORAL | 0 refills | Status: DC

## 2024-04-11 MED ORDER — SERTRALINE HCL 50 MG PO TABS: 50.0000 mg | ORAL_TABLET | Freq: Every day | ORAL | 3 refills | Status: DC

## 2024-04-11 NOTE — Progress Notes (Signed)
 Established Patient Office Visit  Subjective:   Patient ID: Jordan Zimmerman, female    DOB: [birth year]  Age: 53 y.o. MRN: [id number]  CC:  Chief Complaint  Patient presents with   Medical Management of Chronic Issues    HPI: Jordan Zimmerman is a 53 y.o. 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 She does have an epi pen.   Anxiety, recent increased stressors which is contributing to her anxiety and also lack of sleep. She has trouble falling asleep as well as staying asleep. She does have headaches that are worse in the am, and She does have seasonal allergies but she took allergy meds x one month and still had the headaches. She does also see neurology for the headaches, with a  recent sleep study that resulted with mild sleep apnea she has option per neurology for cpap but can forgo for a dental appliance. She does admit to grinding at night time and clenching often in the daytime. Does report she feels dizzy at times, lots of PND.   She has been on cymbalta  and gabapentin  in the past of which did not help. No suicidal thoughts. Mainly anxiety over depression. She does report some agitation associated with this as well. She does suffer from panic attacks.   Does also find that she has lack of concentration and focus as well as scattered thoughts.   She was curious if she could be tested for ADHD    ROS: Negative unless specifically indicated above in HPI.   Relevant past medical history reviewed and updated as indicated.   Allergies and medications reviewed and updated.   Current Outpatient Medications:    EPINEPHrine  0.3 mg/0.3 mL IJ SOAJ injection, Inject 0.3 mg into the muscle as needed for anaphylaxis., Disp: 1 each, Rfl: 0   hydrOXYzine (ATARAX) 25 MG tablet, Take 1-2 tablets po every day prn sleep and  or anxiety, Disp: 30 tablet, Rfl: 0   levonorgestrel  (MIRENA ) 20 MCG/DAY IUD, 1 each by Intrauterine route once., Disp: , Rfl:    omeprazole  (PRILOSEC) 20 MG capsule, TAKE 1 CAPSULE BY MOUTH EVERY DAY, Disp: 90 capsule, Rfl: 1   ondansetron  (ZOFRAN ) 4 MG tablet, Take 1 tablet (4 mg total) by mouth every 8 (eight) hours as needed for nausea or vomiting., Disp: 20 tablet, Rfl: 2   propranolol  ER (INDERAL  LA) 60 MG 24 hr capsule, Take 1 capsule (60 mg total) by mouth daily., Disp: 90 capsule, Rfl: 3   Rimegepant Sulfate (NURTEC) 75 MG TBDP, Take 1 tablet (75 mg total) by mouth as needed (Take one tablet at migraine onset (DO NOT EXCEED MORE THAN 1 TAB IN 24 HRS))., Disp: 8 tablet, Rfl: 5   sertraline (ZOLOFT) 50 MG tablet, Take 1 tablet (50 mg total) by mouth daily., Disp: 30 tablet, Rfl: 3   tiZANidine  (ZANAFLEX ) 4 MG tablet, TAKE 1/2 TO ONE TABLET BY MOUTH AT BEDTIME AS NEEDED MUSCLE SPASM, Disp: 90 tablet, Rfl: 1   Vibegron  (GEMTESA ) 75 MG TABS, Take 1 tablet (75 mg total) by mouth daily., Disp: 30 tablet, Rfl: 11  Allergies  Allergen Reactions   Ambien [Zolpidem Tartrate] Anaphylaxis   Amoxicillin Hives and Swelling    Fever.    Codeine  Other (See Comments)    migraines   Penicillins Hives and Other (See Comments)  High fever   Sulfamethizole Hives and Itching    Itching in throat and mouth     Objective:   BP 102/66 (BP Location: Right Arm, Patient Position: Sitting, Cuff Size: Normal)   Pulse 78   Temp 97.7 F (36.5 C) (Temporal)   Ht 5\' 5"  (1.651 m)   Wt 128 lb (58.1 kg)   SpO2 99%   BMI 21.30 kg/m    Physical Exam Constitutional:      General: She is not in acute distress.    Appearance: Normal appearance. She is normal weight. She is not ill-appearing, toxic-appearing or diaphoretic.  HENT:     Head: Normocephalic.     Right Ear: Tympanic membrane normal.     Left Ear: Tympanic membrane normal.     Nose: Nose normal.     Right Turbinates: Swollen.     Left  Turbinates: Swollen.     Mouth/Throat:     Mouth: Mucous membranes are dry.     Pharynx: No oropharyngeal exudate or posterior oropharyngeal erythema.  Eyes:     Extraocular Movements: Extraocular movements intact.     Pupils: Pupils are equal, round, and reactive to light.  Cardiovascular:     Rate and Rhythm: Normal rate and regular rhythm.     Pulses: Normal pulses.     Heart sounds: Normal heart sounds.  Pulmonary:     Effort: Pulmonary effort is normal.     Breath sounds: Normal breath sounds.  Musculoskeletal:     Cervical back: Normal range of motion.  Neurological:     General: No focal deficit present.     Mental Status: She is alert and oriented to person, place, and time. Mental status is at baseline.  Psychiatric:        Mood and Affect: Mood normal.        Behavior: Behavior normal.        Thought Content: Thought content normal.        Judgment: Judgment normal.     Assessment & Plan:  Depression with anxiety Assessment & Plan:  I instructed pt to start sertraline 1/2 tablet once daily for 1 week and then increase to a full tablet once daily on week two as tolerated.  We discussed common side effects such as nausea, drowsiness and weight gain.  Also discussed rare but serious side effect of suicidal ideation.  She is instructed to discontinue medication and go directly to ED if this occurs.  Pt verbalizes understanding.  Plan is to follow up in 30 days to evaluate progress.   Hydroxyzine as needed.  Orders: -     Sertraline HCl; Take 1 tablet (50 mg total) by mouth daily.  Dispense: 30 tablet; Refill: 3 -     hydrOXYzine HCl; Take 1-2 tablets po every day prn sleep and or anxiety  Dispense: 30 tablet; Refill: 0 -     Ambulatory referral to Psychiatry  Bruxism Assessment & Plan: Advised to see dentist for bite block.  Suspect this is related to headaches potentially.  Muscle relaxer prn.  Ibuprofen/tylenol  heat to jaw as needed.   Mild obstructive sleep  apnea Assessment & Plan: New dx  Discussed cpap vs dental appliance.  She would like to opt for CPAP and she will follow up with her neurologist.   Lack of concentration Assessment & Plan: Advised this could be anxiety vs adhd.  Treating anxiety and we will evaluate if anxiety only.  Will refer to psychiatrist for further eval  Orders: -     Ambulatory referral to Psychiatry  Seasonal allergic rhinitis due to pollen Assessment & Plan: Discussed starting allergy medication nightly as well as discussed flonase.  Likely causing some dizziness due to sinus pressure.     Morning headache Assessment & Plan: Suspect allergy as well as bruxism contributing.      Follow up plan: Return in about 6 weeks (around 05/23/2024) for f/u anxiety.  Felicita Horns, FNP

## 2024-04-11 NOTE — Assessment & Plan Note (Signed)
 Suspect allergy as well as bruxism contributing.

## 2024-04-11 NOTE — Assessment & Plan Note (Signed)
 Advised this could be anxiety vs adhd.  Treating anxiety and we will evaluate if anxiety only.  Will refer to psychiatrist for further eval

## 2024-04-11 NOTE — Patient Instructions (Signed)
 Jordan Zimmerman

## 2024-04-11 NOTE — Assessment & Plan Note (Signed)
 I instructed pt to start sertraline 1/2 tablet once daily for 1 week and then increase to a full tablet once daily on week two as tolerated.  We discussed common side effects such as nausea, drowsiness and weight gain.  Also discussed rare but serious side effect of suicidal ideation.  She is instructed to discontinue medication and go directly to ED if this occurs.  Pt verbalizes understanding.  Plan is to follow up in 30 days to evaluate progress.   Hydroxyzine as needed.

## 2024-04-11 NOTE — Assessment & Plan Note (Signed)
 Discussed starting allergy medication nightly as well as discussed flonase.  Likely causing some dizziness due to sinus pressure.

## 2024-04-11 NOTE — Assessment & Plan Note (Signed)
 New dx  Discussed cpap vs dental appliance.  She would like to opt for CPAP and she will follow up with her neurologist.

## 2024-04-11 NOTE — Assessment & Plan Note (Signed)
 Advised to see dentist for bite block.  Suspect this is related to headaches potentially.  Muscle relaxer prn.  Ibuprofen/tylenol  heat to jaw as needed.

## 2024-04-15 ENCOUNTER — Ambulatory Visit: Payer: Self-pay | Admitting: Family

## 2024-04-16 ENCOUNTER — Other Ambulatory Visit: Payer: Self-pay | Admitting: *Deleted

## 2024-04-16 DIAGNOSIS — G4733 Obstructive sleep apnea (adult) (pediatric): Secondary | ICD-10-CM

## 2024-04-16 DIAGNOSIS — F4311 Post-traumatic stress disorder, acute: Secondary | ICD-10-CM | POA: Diagnosis not present

## 2024-04-16 NOTE — Telephone Encounter (Signed)
-----   Message from Amy Lomax sent at 04/16/2024  2:30 PM EDT ----- Regarding: CPAP Great! Ill ask my staff to contact her. We can place orders for CPAP therapy if she is ready. Thank you for letting us  know. ----- Message ----- From: Felicita Horns, FNP Sent: 04/15/2024   9:56 PM EDT To: Neomia Banner, MD; Terrilyn Fick, NP  At her last office visit with me she said she would be reaching out as she feels she would benefit most from the CPAP.

## 2024-04-16 NOTE — Telephone Encounter (Signed)
 Left message on patient voicemail to call back to log onto my chart to review message and reply to the below.

## 2024-04-16 NOTE — Telephone Encounter (Signed)
 Spoke with Dr. Albertina Hugger who would like to order Autopap 5-12cm (since OSA mild) 2cm EPR, mask of pt choice, heated humidifier, all supplies as needed

## 2024-04-18 DIAGNOSIS — F4311 Post-traumatic stress disorder, acute: Secondary | ICD-10-CM | POA: Diagnosis not present

## 2024-04-18 NOTE — Progress Notes (Signed)
 noted

## 2024-04-20 ENCOUNTER — Telehealth: Payer: Self-pay

## 2024-04-20 NOTE — Telephone Encounter (Signed)
 Pharmacy Patient Advocate Encounter   Received notification from CoverMyMeds that prior authorization for AJOVY  (fremanezumab -vfrm) injection 225MG /1.5ML auto-injectors is due for renewal.   Insurance verification completed.   The patient is insured through [facility]  Action: Medication has been discontinued. Archived Key: [id number]

## 2024-04-24 DIAGNOSIS — F4311 Post-traumatic stress disorder, acute: Secondary | ICD-10-CM | POA: Diagnosis not present

## 2024-04-25 ENCOUNTER — Telehealth: Payer: Self-pay | Admitting: Pharmacist

## 2024-04-25 NOTE — Telephone Encounter (Signed)
 Pharmacy Patient Advocate Encounter   Received notification from CoverMyMeds that prior authorization for Nurtec 75MG  dispersible tablets is required/requested.   Insurance verification completed.   The patient is insured through [facility] .   Per test claim: PA required; PA submitted to above mentioned insurance via CoverMyMeds Key/confirmation #/EOC [facility] Status is pending

## 2024-04-25 NOTE — Telephone Encounter (Signed)
 Pharmacy Patient Advocate Encounter  Received notification from [facility] that Prior Authorization for Nurtec 75MG  dispersible tablets has been APPROVED from 04/25/2024 to 04/25/2026   PA #/Case ID/Reference #: [id number]

## 2024-04-30 DIAGNOSIS — F4311 Post-traumatic stress disorder, acute: Secondary | ICD-10-CM | POA: Diagnosis not present

## 2024-05-01 MED ORDER — HYDROXYZINE PAMOATE 25 MG PO CAPS: ORAL_CAPSULE | ORAL | 2 refills | Status: DC

## 2024-05-01 NOTE — Addendum Note (Signed)
 Addended by: Felicita Horns on: 05/01/2024 01:33 PM   Modules accepted: Orders

## 2024-05-03 ENCOUNTER — Other Ambulatory Visit: Payer: Self-pay | Admitting: Family

## 2024-05-03 DIAGNOSIS — F4311 Post-traumatic stress disorder, acute: Secondary | ICD-10-CM | POA: Diagnosis not present

## 2024-05-03 DIAGNOSIS — F418 Other specified anxiety disorders: Secondary | ICD-10-CM

## 2024-05-04 ENCOUNTER — Encounter: Payer: Self-pay | Admitting: Family

## 2024-05-04 DIAGNOSIS — F418 Other specified anxiety disorders: Secondary | ICD-10-CM

## 2024-05-07 DIAGNOSIS — F4311 Post-traumatic stress disorder, acute: Secondary | ICD-10-CM | POA: Diagnosis not present

## 2024-05-07 MED ORDER — HYDROXYZINE HCL 25 MG PO TABS
25.0000 mg | ORAL_TABLET | Freq: Three times a day (TID) | ORAL | 0 refills | Status: DC | PRN
Start: 2024-05-07 — End: 2024-05-22

## 2024-05-10 DIAGNOSIS — F4311 Post-traumatic stress disorder, acute: Secondary | ICD-10-CM | POA: Diagnosis not present

## 2024-05-14 DIAGNOSIS — F4311 Post-traumatic stress disorder, acute: Secondary | ICD-10-CM | POA: Diagnosis not present

## 2024-05-16 ENCOUNTER — Telehealth: Payer: Self-pay | Admitting: Family

## 2024-05-16 NOTE — Telephone Encounter (Signed)
 Copied from CRM [id number]. 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 Please contact her back at [phone] ext [phone].

## 2024-05-17 DIAGNOSIS — F4311 Post-traumatic stress disorder, acute: Secondary | ICD-10-CM | POA: Diagnosis not present

## 2024-05-21 ENCOUNTER — Telehealth: Payer: Self-pay

## 2024-05-21 ENCOUNTER — Other Ambulatory Visit: Payer: Self-pay | Admitting: Family

## 2024-05-21 DIAGNOSIS — F418 Other specified anxiety disorders: Secondary | ICD-10-CM

## 2024-05-21 DIAGNOSIS — F4311 Post-traumatic stress disorder, acute: Secondary | ICD-10-CM | POA: Diagnosis not present

## 2024-05-21 NOTE — Telephone Encounter (Signed)
 Copied from CRM [id number]. Topic: General - Other >> May 18, 2024  4:51 PM Berneda F wrote: Reason for CRM:   Cherry from [employer]) wanted clarification fot this appeal. Appeal is for 11/27/2023 for the CT of Pelvis/abdomen but thinks it may be a duplicate of the CT on 11/29/2023.  Are we able to confirm this was two different or if this appeal is valid?   The appeal date is for the CT on 11/27/2023.  Connell can be reached at-[phone]

## 2024-05-22 ENCOUNTER — Other Ambulatory Visit: Payer: Self-pay | Admitting: Physician Assistant

## 2024-05-22 DIAGNOSIS — N3281 Overactive bladder: Secondary | ICD-10-CM

## 2024-05-23 DIAGNOSIS — F4311 Post-traumatic stress disorder, acute: Secondary | ICD-10-CM | POA: Diagnosis not present

## 2024-05-24 ENCOUNTER — Ambulatory Visit (INDEPENDENT_AMBULATORY_CARE_PROVIDER_SITE_OTHER): Admitting: Family

## 2024-05-24 VITALS — BP 116/72 | HR 60 | Temp 97.6°F | Ht 65.0 in | Wt 134.2 lb

## 2024-05-24 DIAGNOSIS — F418 Other specified anxiety disorders: Secondary | ICD-10-CM | POA: Diagnosis not present

## 2024-05-24 DIAGNOSIS — F458 Other somatoform disorders: Secondary | ICD-10-CM

## 2024-05-24 NOTE — Patient Instructions (Addendum)
  Call this number to get scheduled with psychiatry  [phone] If you have an issue let me know.   Switch sertraline  (zoloft ) to night time vs day time.  Try skipping your hydroxyzine  In the am and see if this helps can take at night (can cause drowsiness) You could also try 1/2 of this , 1/2 tablet in the am and see if this causes less fatigue.   Tizanidine  is a muscle relaxer to utilize when needed for your grinding. This will make you tired so stick with this at night time.

## 2024-05-24 NOTE — Assessment & Plan Note (Signed)
 Suspect fatigue r/o trauma therapy  Could also be from sertraline / hydroxyzine  Advised to stop am dose hydroxyzine  to see if contributing.  If yes can try 1/2 tablet po am prn.  Start taking sertraline  at night time vs daytime.

## 2024-05-24 NOTE — Assessment & Plan Note (Signed)
 Continue to f/u for possible bite block coverage Tizanidine  needed prn  Suggested at night time due to sedative side effects possible.  Do not take with hydroxyzine .

## 2024-05-24 NOTE — Progress Notes (Signed)
 Established Patient Office Visit  Subjective:   Patient ID: Jordan Zimmerman, female    DOB: [birth year]  Age: 53 y.o. MRN: [id number]  CC:  Chief Complaint  Patient presents with   Medical Management of Chronic Issues    Here for f/u.    HPI: Jordan Zimmerman is a 53 y.o. female presenting on 05/24/2024 for Medical Management of Chronic Issues (Here for f/u.)  Depression anxiety: f/u today. Started on sertraline  last visit 50 mg once daily. She really likes it and is doing well on this without side effects. She does state in the last one week she has had to have three naps during the day due to fatigue. She is taking the zoloft  in the am. She does find she is sleeping better at night time and will sleep through the night with the hydroxyzine . She is following with a therapist twice a week. She has been having trauma therapy twice weekly which could also be contributing to fatigue.   Bruxism: insurance won't cover a bite block  with dentist however they are going to correlate with neurology to see if this would be covered or not.   Lack of concentration last visit referred to psychiatry. Has not received a phone call yet         ROS: Negative unless specifically indicated above in HPI.   Relevant past medical history reviewed and updated as indicated.   Allergies and medications reviewed and updated.   Current Outpatient Medications:    hydrOXYzine  (ATARAX ) 25 MG tablet, TAKE 1 TABLET BY MOUTH THREE TIMES A DAY AS NEEDED, Disp: 30 tablet, Rfl: 0   levonorgestrel  (MIRENA ) 20 MCG/DAY IUD, 1 each by Intrauterine route once., Disp: , Rfl:    omeprazole  (PRILOSEC) 20 MG capsule, TAKE 1 CAPSULE BY MOUTH EVERY DAY, Disp: 90 capsule, Rfl: 1   ondansetron  (ZOFRAN ) 4 MG tablet, Take 1 tablet (4 mg total) by mouth every 8 (eight) hours as needed for nausea or vomiting., Disp: 20 tablet, Rfl: 2   propranolol  ER (INDERAL  LA) 60 MG 24 hr capsule, Take 1 capsule (60 mg total) by mouth daily., Disp: 90  capsule, Rfl: 3   Rimegepant Sulfate (NURTEC) 75 MG TBDP, Take 1 tablet (75 mg total) by mouth as needed (Take one tablet at migraine onset (DO NOT EXCEED MORE THAN 1 TAB IN 24 HRS))., Disp: 8 tablet, Rfl: 5   sertraline  (ZOLOFT ) 50 MG tablet, Take 1 tablet (50 mg total) by mouth daily., Disp: 30 tablet, Rfl: 3   tiZANidine  (ZANAFLEX ) 4 MG tablet, TAKE 1/2 TO ONE TABLET BY MOUTH AT BEDTIME AS NEEDED MUSCLE SPASM, Disp: 90 tablet, Rfl: 1   Vibegron  (GEMTESA ) 75 MG TABS, Take 1 tablet (75 mg total) by mouth daily., Disp: 30 tablet, Rfl: 11  Allergies  Allergen Reactions   Ambien [Zolpidem Tartrate] Anaphylaxis   Amoxicillin Hives and Swelling    Fever.    Codeine  Other (See Comments)    migraines   Penicillins Hives and Other (See Comments)    High fever   Sulfamethizole Hives and Itching    Itching in throat and mouth     Objective:   BP 116/72   Pulse 60   Temp 97.6 F (36.4 C) (Oral)   Ht 5' 5 (1.651 m)   Wt 134 lb 4 oz (60.9 kg)   LMP 05/05/2024   SpO2 99%   BMI 22.34 kg/m    Physical Exam Vitals reviewed.  Constitutional:  General: She is not in acute distress.    Appearance: Normal appearance. She is normal weight. She is not ill-appearing, toxic-appearing or diaphoretic.  HENT:     Head: Normocephalic.   Cardiovascular:     Rate and Rhythm: Normal rate.  Pulmonary:     Effort: Pulmonary effort is normal.   Musculoskeletal:        General: Normal range of motion.   Neurological:     General: No focal deficit present.     Mental Status: She is alert and oriented to person, place, and time. Mental status is at baseline.   Psychiatric:        Mood and Affect: Mood normal.        Behavior: Behavior normal.        Thought Content: Thought content normal.        Judgment: Judgment normal.     Assessment & Plan:  Depression with anxiety Assessment & Plan: Suspect fatigue r/o trauma therapy  Could also be from sertraline / hydroxyzine  Advised to stop  am dose hydroxyzine  to see if contributing.  If yes can try 1/2 tablet po am prn.  Start taking sertraline  at night time vs daytime.     Bruxism Assessment & Plan: Continue to f/u for possible bite block coverage Tizanidine  needed prn  Suggested at night time due to sedative side effects possible.  Do not take with hydroxyzine .        Follow up plan: Return if symptoms worsen or fail to improve.  Ginger Patrick, FNP

## 2024-05-28 DIAGNOSIS — F4311 Post-traumatic stress disorder, acute: Secondary | ICD-10-CM | POA: Diagnosis not present

## 2024-05-29 ENCOUNTER — Telehealth: Payer: Self-pay

## 2024-05-29 NOTE — Telephone Encounter (Signed)
 PA for gemtesa  sent, waiting on results.

## 2024-05-30 DIAGNOSIS — F4311 Post-traumatic stress disorder, acute: Secondary | ICD-10-CM | POA: Diagnosis not present

## 2024-06-04 DIAGNOSIS — F4311 Post-traumatic stress disorder, acute: Secondary | ICD-10-CM | POA: Diagnosis not present

## 2024-06-06 ENCOUNTER — Other Ambulatory Visit: Payer: Self-pay

## 2024-06-06 MED ORDER — ONDANSETRON HCL 4 MG PO TABS: 4.0000 mg | ORAL_TABLET | Freq: Three times a day (TID) | ORAL | 2 refills | Status: DC | PRN

## 2024-06-06 MED ORDER — HYDROXYZINE HCL 25 MG PO TABS: ORAL_TABLET | ORAL | 0 refills | Status: DC

## 2024-06-06 NOTE — Addendum Note (Signed)
 Addended by: CORWIN ANTU on: 06/06/2024 12:51 PM   Modules accepted: Orders

## 2024-06-06 NOTE — Telephone Encounter (Signed)
 Can you look into the message below in regards to the CT scan?

## 2024-06-07 DIAGNOSIS — F4311 Post-traumatic stress disorder, acute: Secondary | ICD-10-CM | POA: Diagnosis not present

## 2024-06-11 DIAGNOSIS — F4311 Post-traumatic stress disorder, acute: Secondary | ICD-10-CM | POA: Diagnosis not present

## 2024-06-14 DIAGNOSIS — F4311 Post-traumatic stress disorder, acute: Secondary | ICD-10-CM | POA: Diagnosis not present

## 2024-06-18 DIAGNOSIS — F4311 Post-traumatic stress disorder, acute: Secondary | ICD-10-CM | POA: Diagnosis not present

## 2024-06-21 DIAGNOSIS — F4311 Post-traumatic stress disorder, acute: Secondary | ICD-10-CM | POA: Diagnosis not present

## 2024-06-25 DIAGNOSIS — F4311 Post-traumatic stress disorder, acute: Secondary | ICD-10-CM | POA: Diagnosis not present

## 2024-06-28 ENCOUNTER — Encounter: Payer: Self-pay | Admitting: Family

## 2024-06-28 DIAGNOSIS — F4311 Post-traumatic stress disorder, acute: Secondary | ICD-10-CM | POA: Diagnosis not present

## 2024-06-28 DIAGNOSIS — F4310 Post-traumatic stress disorder, unspecified: Secondary | ICD-10-CM

## 2024-06-29 ENCOUNTER — Other Ambulatory Visit: Payer: Self-pay | Admitting: Physician Assistant

## 2024-06-29 ENCOUNTER — Other Ambulatory Visit: Payer: Self-pay | Admitting: Family

## 2024-06-29 DIAGNOSIS — F418 Other specified anxiety disorders: Secondary | ICD-10-CM

## 2024-06-29 DIAGNOSIS — N3281 Overactive bladder: Secondary | ICD-10-CM

## 2024-06-29 DIAGNOSIS — R1013 Epigastric pain: Secondary | ICD-10-CM

## 2024-06-29 MED ORDER — SERTRALINE HCL 100 MG PO TABS: 100.0000 mg | ORAL_TABLET | Freq: Every day | ORAL | 1 refills | Status: AC

## 2024-07-09 DIAGNOSIS — F4311 Post-traumatic stress disorder, acute: Secondary | ICD-10-CM | POA: Diagnosis not present

## 2024-07-10 ENCOUNTER — Other Ambulatory Visit: Payer: Self-pay | Admitting: Family

## 2024-07-10 DIAGNOSIS — F418 Other specified anxiety disorders: Secondary | ICD-10-CM

## 2024-07-11 ENCOUNTER — Encounter: Admitting: Family Medicine

## 2024-07-12 DIAGNOSIS — F4311 Post-traumatic stress disorder, acute: Secondary | ICD-10-CM | POA: Diagnosis not present

## 2024-07-12 NOTE — Telephone Encounter (Signed)
PA for Gemtesa approved.

## 2024-07-19 DIAGNOSIS — F4311 Post-traumatic stress disorder, acute: Secondary | ICD-10-CM | POA: Diagnosis not present

## 2024-07-24 ENCOUNTER — Telehealth: Payer: Self-pay | Admitting: Neurology

## 2024-07-24 DIAGNOSIS — F4311 Post-traumatic stress disorder, acute: Secondary | ICD-10-CM | POA: Diagnosis not present

## 2024-07-24 NOTE — Telephone Encounter (Signed)
 Re: the CPAP referral the DME states at this time they have been unable to schedule services with pt.  Multiple attempts have been made to reach pt.  Should pt wish to proceed at a later date , the DME will reenter the order and coordinate care accordingly. Letter dated 07-23-24

## 2024-07-25 ENCOUNTER — Other Ambulatory Visit: Payer: Self-pay | Admitting: Neurology

## 2024-07-26 DIAGNOSIS — F4311 Post-traumatic stress disorder, acute: Secondary | ICD-10-CM | POA: Diagnosis not present

## 2024-08-02 DIAGNOSIS — F4311 Post-traumatic stress disorder, acute: Secondary | ICD-10-CM | POA: Diagnosis not present

## 2024-08-13 DIAGNOSIS — F4311 Post-traumatic stress disorder, acute: Secondary | ICD-10-CM | POA: Diagnosis not present

## 2024-08-16 DIAGNOSIS — F4311 Post-traumatic stress disorder, acute: Secondary | ICD-10-CM | POA: Diagnosis not present

## 2024-08-20 DIAGNOSIS — F4311 Post-traumatic stress disorder, acute: Secondary | ICD-10-CM | POA: Diagnosis not present

## 2024-08-23 DIAGNOSIS — F4311 Post-traumatic stress disorder, acute: Secondary | ICD-10-CM | POA: Diagnosis not present

## 2024-08-25 ENCOUNTER — Other Ambulatory Visit: Payer: Self-pay | Admitting: Neurology

## 2024-08-27 DIAGNOSIS — F4311 Post-traumatic stress disorder, acute: Secondary | ICD-10-CM | POA: Diagnosis not present

## 2024-08-29 DIAGNOSIS — F4311 Post-traumatic stress disorder, acute: Secondary | ICD-10-CM | POA: Diagnosis not present

## 2024-09-03 DIAGNOSIS — F4311 Post-traumatic stress disorder, acute: Secondary | ICD-10-CM | POA: Diagnosis not present

## 2024-09-06 DIAGNOSIS — F4311 Post-traumatic stress disorder, acute: Secondary | ICD-10-CM | POA: Diagnosis not present

## 2024-09-10 DIAGNOSIS — F4311 Post-traumatic stress disorder, acute: Secondary | ICD-10-CM | POA: Diagnosis not present

## 2024-09-12 ENCOUNTER — Encounter: Payer: Self-pay | Admitting: Family

## 2024-09-20 DIAGNOSIS — F4311 Post-traumatic stress disorder, acute: Secondary | ICD-10-CM | POA: Diagnosis not present

## 2024-09-24 DIAGNOSIS — F4311 Post-traumatic stress disorder, acute: Secondary | ICD-10-CM | POA: Diagnosis not present

## 2024-10-01 DIAGNOSIS — F4311 Post-traumatic stress disorder, acute: Secondary | ICD-10-CM | POA: Diagnosis not present

## 2024-10-04 DIAGNOSIS — F4311 Post-traumatic stress disorder, acute: Secondary | ICD-10-CM | POA: Diagnosis not present

## 2024-10-08 ENCOUNTER — Other Ambulatory Visit: Payer: Self-pay | Admitting: Family

## 2024-10-08 ENCOUNTER — Other Ambulatory Visit: Payer: Self-pay | Admitting: Family Medicine

## 2024-10-08 DIAGNOSIS — E8809 Other disorders of plasma-protein metabolism, not elsewhere classified: Secondary | ICD-10-CM

## 2024-10-10 DIAGNOSIS — F4311 Post-traumatic stress disorder, acute: Secondary | ICD-10-CM | POA: Diagnosis not present

## 2024-10-15 DIAGNOSIS — F4311 Post-traumatic stress disorder, acute: Secondary | ICD-10-CM | POA: Diagnosis not present

## 2024-10-22 DIAGNOSIS — F4311 Post-traumatic stress disorder, acute: Secondary | ICD-10-CM | POA: Diagnosis not present

## 2024-10-29 DIAGNOSIS — F4311 Post-traumatic stress disorder, acute: Secondary | ICD-10-CM | POA: Diagnosis not present

## 2024-11-01 DIAGNOSIS — F4311 Post-traumatic stress disorder, acute: Secondary | ICD-10-CM | POA: Diagnosis not present

## 2024-11-05 DIAGNOSIS — F4311 Post-traumatic stress disorder, acute: Secondary | ICD-10-CM | POA: Diagnosis not present

## 2024-11-08 ENCOUNTER — Other Ambulatory Visit: Payer: Self-pay | Admitting: Neurology

## 2024-11-08 DIAGNOSIS — F4311 Post-traumatic stress disorder, acute: Secondary | ICD-10-CM | POA: Diagnosis not present

## 2024-11-12 DIAGNOSIS — F4311 Post-traumatic stress disorder, acute: Secondary | ICD-10-CM | POA: Diagnosis not present

## 2024-11-15 DIAGNOSIS — F4311 Post-traumatic stress disorder, acute: Secondary | ICD-10-CM | POA: Diagnosis not present

## 2024-11-20 DIAGNOSIS — F4311 Post-traumatic stress disorder, acute: Secondary | ICD-10-CM | POA: Diagnosis not present

## 2024-11-27 DIAGNOSIS — F4311 Post-traumatic stress disorder, acute: Secondary | ICD-10-CM | POA: Diagnosis not present

## 2024-12-01 ENCOUNTER — Other Ambulatory Visit: Payer: Self-pay | Admitting: Family

## 2024-12-01 DIAGNOSIS — R1013 Epigastric pain: Secondary | ICD-10-CM

## 2024-12-01 DIAGNOSIS — M62838 Other muscle spasm: Secondary | ICD-10-CM

## 2025-02-01 ENCOUNTER — Ambulatory Visit: Admitting: Physician Assistant
